# Patient Record
Sex: Female | Born: 1990 | Race: White | Hispanic: No | Marital: Married | Smoking: Current every day smoker
Health system: Southern US, Community
[De-identification: ages and names within clinical notes are randomized; demographics above are authoritative.]

## PROBLEM LIST (undated history)

## (undated) DIAGNOSIS — Z87410 Personal history of cervical dysplasia: Secondary | ICD-10-CM

## (undated) DIAGNOSIS — F329 Major depressive disorder, single episode, unspecified: Secondary | ICD-10-CM

## (undated) DIAGNOSIS — Q760 Spina bifida occulta: Secondary | ICD-10-CM

## (undated) DIAGNOSIS — R202 Paresthesia of skin: Secondary | ICD-10-CM

## (undated) DIAGNOSIS — M797 Fibromyalgia: Secondary | ICD-10-CM

## (undated) DIAGNOSIS — N200 Calculus of kidney: Secondary | ICD-10-CM

## (undated) DIAGNOSIS — M419 Scoliosis, unspecified: Secondary | ICD-10-CM

## (undated) DIAGNOSIS — F411 Generalized anxiety disorder: Secondary | ICD-10-CM

## (undated) DIAGNOSIS — Z8742 Personal history of other diseases of the female genital tract: Secondary | ICD-10-CM

## (undated) DIAGNOSIS — IMO0002 Reserved for concepts with insufficient information to code with codable children: Secondary | ICD-10-CM

## (undated) DIAGNOSIS — K449 Diaphragmatic hernia without obstruction or gangrene: Secondary | ICD-10-CM

## (undated) DIAGNOSIS — G894 Chronic pain syndrome: Secondary | ICD-10-CM

## (undated) DIAGNOSIS — T4145XA Adverse effect of unspecified anesthetic, initial encounter: Secondary | ICD-10-CM

## (undated) DIAGNOSIS — R200 Anesthesia of skin: Secondary | ICD-10-CM

## (undated) DIAGNOSIS — Z87442 Personal history of urinary calculi: Secondary | ICD-10-CM

## (undated) DIAGNOSIS — G950 Syringomyelia and syringobulbia: Secondary | ICD-10-CM

## (undated) DIAGNOSIS — Z8659 Personal history of other mental and behavioral disorders: Secondary | ICD-10-CM

## (undated) DIAGNOSIS — N201 Calculus of ureter: Secondary | ICD-10-CM

## (undated) DIAGNOSIS — F32A Depression, unspecified: Secondary | ICD-10-CM

## (undated) DIAGNOSIS — J4520 Mild intermittent asthma, uncomplicated: Secondary | ICD-10-CM

## (undated) DIAGNOSIS — K219 Gastro-esophageal reflux disease without esophagitis: Secondary | ICD-10-CM

## (undated) DIAGNOSIS — T8859XA Other complications of anesthesia, initial encounter: Secondary | ICD-10-CM

## (undated) DIAGNOSIS — R29898 Other symptoms and signs involving the musculoskeletal system: Secondary | ICD-10-CM

## (undated) DIAGNOSIS — R112 Nausea with vomiting, unspecified: Secondary | ICD-10-CM

## (undated) DIAGNOSIS — Z9889 Other specified postprocedural states: Secondary | ICD-10-CM

## (undated) HISTORY — PX: CYSTOSCOPY/RETROGRADE/URETEROSCOPY: SHX5316

## (undated) HISTORY — DX: Reserved for concepts with insufficient information to code with codable children: IMO0002

## (undated) HISTORY — DX: Fibromyalgia: M79.7

## (undated) HISTORY — DX: Gastro-esophageal reflux disease without esophagitis: K21.9

---
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
## 2008-03-17 ENCOUNTER — Ambulatory Visit: Payer: Self-pay | Admitting: Orthopedic Surgery

## 2008-03-17 DIAGNOSIS — G950 Syringomyelia and syringobulbia: Secondary | ICD-10-CM

## 2008-03-17 DIAGNOSIS — M549 Dorsalgia, unspecified: Secondary | ICD-10-CM

## 2008-03-17 DIAGNOSIS — G8929 Other chronic pain: Secondary | ICD-10-CM

## 2008-03-21 ENCOUNTER — Ambulatory Visit (HOSPITAL_COMMUNITY): Admission: RE | Admit: 2008-03-21 | Discharge: 2008-03-21 | Payer: Self-pay | Admitting: Orthopedic Surgery

## 2008-03-28 ENCOUNTER — Ambulatory Visit: Payer: Self-pay | Admitting: Orthopedic Surgery

## 2008-04-01 ENCOUNTER — Encounter (INDEPENDENT_AMBULATORY_CARE_PROVIDER_SITE_OTHER): Payer: Self-pay | Admitting: *Deleted

## 2008-04-08 ENCOUNTER — Telehealth: Payer: Self-pay | Admitting: Orthopedic Surgery

## 2008-05-13 ENCOUNTER — Encounter: Payer: Self-pay | Admitting: Orthopedic Surgery

## 2008-05-19 ENCOUNTER — Ambulatory Visit (HOSPITAL_COMMUNITY): Admission: RE | Admit: 2008-05-19 | Discharge: 2008-05-19 | Payer: Self-pay | Admitting: Neurosurgery

## 2008-09-21 ENCOUNTER — Encounter: Payer: Self-pay | Admitting: Orthopedic Surgery

## 2009-05-01 ENCOUNTER — Ambulatory Visit (HOSPITAL_COMMUNITY): Admission: RE | Admit: 2009-05-01 | Discharge: 2009-05-01 | Payer: Self-pay | Admitting: Neurosurgery

## 2010-01-07 ENCOUNTER — Emergency Department (HOSPITAL_COMMUNITY)
Admission: EM | Admit: 2010-01-07 | Discharge: 2010-01-07 | Payer: Self-pay | Source: Home / Self Care | Admitting: Emergency Medicine

## 2010-01-08 ENCOUNTER — Ambulatory Visit (HOSPITAL_COMMUNITY)
Admission: RE | Admit: 2010-01-08 | Discharge: 2010-01-08 | Payer: Self-pay | Source: Home / Self Care | Attending: Emergency Medicine | Admitting: Emergency Medicine

## 2010-01-10 ENCOUNTER — Encounter (HOSPITAL_COMMUNITY)
Admission: RE | Admit: 2010-01-10 | Discharge: 2010-02-09 | Payer: Self-pay | Source: Home / Self Care | Attending: Family Medicine | Admitting: Family Medicine

## 2010-01-13 ENCOUNTER — Emergency Department (HOSPITAL_COMMUNITY)
Admission: EM | Admit: 2010-01-13 | Discharge: 2010-01-13 | Payer: Self-pay | Source: Home / Self Care | Admitting: Emergency Medicine

## 2010-01-23 ENCOUNTER — Ambulatory Visit
Admission: RE | Admit: 2010-01-23 | Discharge: 2010-01-23 | Payer: Self-pay | Source: Home / Self Care | Attending: Urgent Care | Admitting: Urgent Care

## 2010-01-23 ENCOUNTER — Telehealth (INDEPENDENT_AMBULATORY_CARE_PROVIDER_SITE_OTHER): Payer: Self-pay

## 2010-01-23 DIAGNOSIS — R112 Nausea with vomiting, unspecified: Secondary | ICD-10-CM | POA: Insufficient documentation

## 2010-01-23 DIAGNOSIS — R1011 Right upper quadrant pain: Secondary | ICD-10-CM | POA: Insufficient documentation

## 2010-01-23 DIAGNOSIS — R197 Diarrhea, unspecified: Secondary | ICD-10-CM | POA: Insufficient documentation

## 2010-01-24 ENCOUNTER — Encounter: Payer: Self-pay | Admitting: Urgent Care

## 2010-01-25 ENCOUNTER — Ambulatory Visit: Admit: 2010-01-25 | Payer: Self-pay | Admitting: Gastroenterology

## 2010-01-25 LAB — CONVERTED CEMR LAB: Beta hcg, urine, semiquantitative: NEGATIVE

## 2010-01-26 ENCOUNTER — Encounter: Payer: Self-pay | Admitting: Internal Medicine

## 2010-01-30 ENCOUNTER — Telehealth (INDEPENDENT_AMBULATORY_CARE_PROVIDER_SITE_OTHER): Payer: Self-pay

## 2010-01-30 ENCOUNTER — Emergency Department (HOSPITAL_COMMUNITY)
Admission: EM | Admit: 2010-01-30 | Discharge: 2010-01-30 | Payer: Self-pay | Source: Home / Self Care | Admitting: Emergency Medicine

## 2010-01-30 ENCOUNTER — Encounter: Payer: Self-pay | Admitting: Internal Medicine

## 2010-01-31 LAB — URINALYSIS, ROUTINE W REFLEX MICROSCOPIC
Bilirubin Urine: NEGATIVE
Ketones, ur: NEGATIVE mg/dL
Leukocytes, UA: NEGATIVE
Nitrite: NEGATIVE
Protein, ur: NEGATIVE mg/dL
Specific Gravity, Urine: >1.03 — ABNORMAL HIGH (ref 1.005–1.030)
Urine Glucose, Fasting: NEGATIVE mg/dL
Urobilinogen, UA: 0.2 mg/dL (ref 0.0–1.0)
pH: 5.5 (ref 5.0–8.0)

## 2010-01-31 LAB — URINE MICROSCOPIC-ADD ON

## 2010-01-31 LAB — PREGNANCY, URINE: Preg Test, Ur: NEGATIVE

## 2010-02-04 ENCOUNTER — Encounter: Payer: Self-pay | Admitting: Family Medicine

## 2010-02-08 ENCOUNTER — Ambulatory Visit (HOSPITAL_COMMUNITY)
Admission: RE | Admit: 2010-02-08 | Discharge: 2010-02-08 | Payer: Self-pay | Source: Home / Self Care | Attending: Internal Medicine | Admitting: Internal Medicine

## 2010-02-10 ENCOUNTER — Encounter: Payer: Self-pay | Admitting: Internal Medicine

## 2010-02-13 ENCOUNTER — Encounter: Payer: Self-pay | Admitting: Urgent Care

## 2010-02-13 NOTE — Op Note (Signed)
[name] [name], [name] [name]                  ACCOUNT NO.:  [id number]  MEDICAL RECORD NO.:  [id number]          PATIENT TYPE:  AMB  LOCATION:  DAY                           FACILITY:  [facility]  PHYSICIAN:  R. Roetta Sessions, M.D. DATE OF BIRTH:  1990-07-19  DATE OF PROCEDURE:  02/08/2010 DATE OF DISCHARGE:                              OPERATIVE REPORT   PROCEDURE:  EGD with biopsy followed by ileal colonoscopy with biopsy.  INDICATIONS FOR PROCEDURE:  Obese 20 year old lady with intermittent nausea, vomiting, and diarrhea, 3 weeks duration.  Stool studies were all negative.  Gallbladder workup including ultrasound and HIDA also negative.  CT demonstrate nonobstructing right ureteral calculus. Pregnancy test came back negative.  She started taking sublingual Levsin prescribed through our office, which just helped to some degree.  She takes omeprazole 20 mg orally daily for reflux.  EGD and colonoscopy is now being done.  Risks, benefits, limitations, alternatives, and imponderables have been discussed, questions answered.  Please see the documentation in the medical record.  PROCEDURE NOTE:  O2 saturation, blood pressure, pulse, and respirations were monitored throughout the entirety of both procedures.  CONSCIOUS SEDATION:  Versed 12 mg IV, Demerol 200 mg IV in divided doses, Phenergan 25 mg diluted slow IV push to augment conscious sedation.  Cetacaine spray for topical pharyngeal anesthesia.  FINDINGS:  Examination of the tubular esophagus revealed circumferential distal esophageal erosions superimposed on noncritical-appearing Schatzki's ring.  There was no Barrett's esophagus.  EG junction easily traversed.  Stomach:  Gastric cavity was emptied and insufflated well with air.  Thorough examination of the gastric mucosa including retroflexed view of the proximal stomach and esophagogastric junction demonstrate some excoriations and some adherent clot on the gastric body.  There is a small  hiatal hernia.  Pylorus is patent, easily traversed.  Examination of the bulb and second portion revealed no abnormalities.  Look back into the stomach confirmed the above findings. There was no ulcer-infiltrating process.  The abnormal-appearing gastric mucosa was biopsied for histologic study.  The patient tolerated the procedure well, was prepared for colonoscopy.  Digital rectal exam revealed no abnormalities.  Endoscopic findings:  The prep was adequate.  Colon:  The colonic mucosa was surveyed from the rectosigmoid junction through the left transverse, right colon to the appendiceal orifice, ileocecal valve/cecum.  These structures were well seen, photographed for the record.  The terminal ileum was intubated 10 cm from this level, scope was slowly withdrawn. All previously mentioned mucosal surfaces were again seen.  Terminal ileal mucosa appeared normal.  The patient had a couple of areas of submucosal petechiae, some minimally fibrotic-appearing mucosa in the ascending segment in a patchy distribution in the descending segment.Biopsies of the ascending segment and descending segment were taken for histologic study.  Remainder of the colonic mucosa appeared unremarkable.  Scope was pulled down to the rectum.  Rectal vault is small, I attempted to retroflex but was unable to do so.  However for the same reason, I was able to see the rectal mucosa very well, en face it appeared normal.  The patient tolerated the procedure well.  Cecal withdrawal time 9 minutes.  IMPRESSION: 1. Esophagogastroduodenoscopy, circumferential distal esophageal     erosions consistent with erosive reflux esophagitis, noncritical     Schatzki's ring not manipulated. 2. Hiatal hernia. 3. Excoriations and eroded mucosa involving the body appeared to be     most likely related to trauma of heaving, status post biopsy     otherwise unremarkable stomach, patent pylorus, normal D1 and D2. 4. Colonoscopy  findings, normal rectum, scattered petechiae and     fibrotic-appearing mucosa of uncertain significance (query     resolving infectious colitis) status post biopsy normal terminal     ileum.  RECOMMENDATIONS: 1. Continue Levsin sublingually a.c. and bedtime p.r.n. abdominal     cramps and diarrhea. 2. Stop omeprazole, begin Dexalone 60 mg orally daily for reflux,     literature on reflux provided to Ms. [name]. 3. Add a probiotic in way of Align 1 capsule daily to regimen. 4. Followup on path. 5. Further recommendations to follow.     Jonathon Bellows, M.D.     RMR/MEDQ  D:  02/08/2010  T:  02/09/2010  Job:  [id number]  cc:   Lorin Picket A. Gerda Diss, MD Fax: [phone]  Electronically Signed by Lorrin Goodell M.D. on 02/13/2010 01:41:14 PM

## 2010-02-15 NOTE — Progress Notes (Signed)
Summary: pt going to ER  Phone Note Call from Patient Call back at Home Phone [phone]   Caller: Patient Summary of Call: FYI-pt called-  she stated she was in excruciating  pain and she was going to the ER.  Initial call taken by: Hendricks Limes LPN,  January 30, 2010 10:38 AM     Appended Document: pt going to ER Agree w/ plan

## 2010-02-15 NOTE — Assessment & Plan Note (Signed)
Summary: ABD PAIN,VOMITING,RECTAL BLEED/SS   Visit Type:  Initial Consult Primary Care Provider:  Sherie Don, NP (Dr Lilyan Punt)  Chief Complaint:  abd pain, vomiting, and and diarrhea.  History of Present Illness: 20 y/o caucasian female here for further evaluation N/V/D and abdominal pain x 3 weeks.  c/o intermittant RUQ pain "stabbing & throbbing" lasts to 1 hr.  Does not vomit every day, but may vomit up to 5-6 times.  Daily diarrhea 3-4 times per day, loose & watery.   Denies mucus or blood.  Weight stable.  Appetite decreased.  c/o early satiety, denies heartburn or indigestion.  Takes percocet 5/325 2 q 6 hrs for pain in side x 2 weeks.  Omperazole 20mg  daily without help.  Taking carafate.   LMP: 3 weeks ago, irregular cycles, believes she had negative upreg at Dr Cathlyn Parsons, uses Nuva Ring HIDA normal 01/10/10 01/04/10->CBC,Met7, LFT normal, lipase normal [place] 12/26->fatty liver    Current Problems (verified): 1)  Ruq Pain  (ICD-789.01) 2)  Diarrhea  (ICD-787.91) 3)  Nausea and Vomiting  (ICD-787.01) 4)  Syringomyelia and Syringobulbia  (ICD-336.0) 5)  Back Pain  (ICD-724.5)  Current Medications (verified): 1)  Advair Diskus 100-50 Mcg/dose Misc (Fluticasone-Salmeterol) 2)  Albuterol Sulfate (2.5 Mg/34ml) 0.083% Nebu (Albuterol Sulfate) 3)  Vicodin 5-500 Mg Tabs (Hydrocodone-Acetaminophen) .Marland Kitchen.. 1 Q 4 As Needed Pain 4)  Nuvoring 5)  Omeprazole 20 Mg Cpdr (Omeprazole) .Marland Kitchen.. 1 By Mouth Daily 6)  Levsin/sl 0.125 Mg Subl (Hyoscyamine Sulfate) .Marland Kitchen.. 1 By Mouth Ac/hs (Up To Qid) As Needed Diarrhea  Allergies (verified): 1)  ! Sulfa  Past History:  Past Medical History: fibromyalgia, previously Dr Murray Hodgkins in [place] (pain clinic) asthma syringomyelia DDD  Past Surgical History: renal lithiasis removed  Family History: No known family history of colorectal carcinoma, IBD, liver or chronic GI problems. Father: (24) MS Mother: (42) cervical CA Siblings: 1 brother  -healthy  Social History: single, lives w/ mother & 1 son, & stepdad & step brother Patient currently smokes.  1/2ppd x 10 yrs Alcohol Use - yes, 1-2 drinks/year Illicit Drug Use - no Patient does not get regular exercise.  full time student [employer]  Smoking Status:  current Drug Use:  no Does Patient Exercise:  no  Review of Systems General:  Complains of sleep disorder; denies fever, chills, sweats, anorexia, fatigue, weakness, malaise, and weight loss. CV:  Denies chest pains, angina, palpitations, syncope, dyspnea on exertion, orthopnea, PND, peripheral edema, and claudication. Resp:  Denies dyspnea at rest, dyspnea with exercise, cough, sputum, wheezing, coughing up blood, and pleurisy. GI:  Denies difficulty swallowing, pain on swallowing, jaundice, and fecal incontinence. GU:  Complains of abnormal vaginal bleeding; denies urinary burning, blood in urine, nocturnal urination, urinary frequency, urinary incontinence, and vaginal discharge; see HPI. MS:  Denies joint pain / LOM, joint swelling, joint stiffness, joint deformity, low back pain, muscle weakness, muscle cramps, muscle atrophy, leg pain at night, leg pain with exertion, and shoulder pain / LOM hand / wrist pain (CTS). Derm:  Denies rash, itching, dry skin, hives, moles, warts, and unhealing ulcers; hands "turning bluish". Neuro:  Denies weakness, paralysis, abnormal sensation, seizures, syncope, tremors, vertigo, transient blindness, frequent falls, frequent headaches, difficulty walking, headache, sciatica, radiculopathy other:, restless legs, memory loss, and confusion. Psych:  Complains of depression; denies anxiety, memory loss, suicidal ideation, hallucinations, paranoia, phobia, and confusion. Heme:  Denies bruising, bleeding, and enlarged lymph nodes.  Vital Signs:  Patient profile:   19 year  old female Height:      60 inches Weight:      249 pounds BMI:     48.81 Temp:     97.7 degrees F oral Pulse  rate:   60 / minute BP sitting:   110 / 72  (left arm) Cuff size:   large  Vitals Entered By: Hendricks Limes LPN (January 23, 2010 11:15 AM)  Physical Exam  General:  Well developed, well nourished, no acute distress. Head:  Normocephalic and atraumatic. Eyes:  sclera clear, no icterus. Ears:  Normal auditory acuity. Nose:  No deformity, discharge,  or lesions. Mouth:  No deformity or lesions, dentition normal. Neck:  Supple; no masses or thyromegaly. Lungs:  Clear throughout to auscultation. Heart:  Regular rate and rhythm; no murmurs, rubs,  or bruits. Abdomen:  Soft, nontender and nondistended. No masses, hepatosplenomegaly or hernias noted. Normal bowel sounds.without guarding and without rebound.   Msk:  Symmetrical with no gross deformities. Normal posture. Pulses:  Normal pulses noted. Extremities:  No clubbing, cyanosis, edema or deformities noted. Neurologic:  Alert and  oriented x4;  grossly normal neurologically. Skin:  Intact without significant lesions or rashes. multiple tattoos Cervical Nodes:  No significant cervical adenopathy. Psych:  Alert and cooperative. Normal mood and affect.  Impression & Recommendations:  Problem # 1:  RUQ PAIN (ICD-789.01) 20 y/o caucasian female w/ nausea, vomiting, & diarrhea x3 wks.  Gallbladder work-up negative.  Differentials include PUD, refractory GERD,  IBS, c diff or infectious colitis, less likely.  She will need further evaluation with EGD and possibly colonoscopy if stool studies are benign. Orders: T-Pregnancy Test, Urine, Qual (16109) Consultation Level IV (60454)  Problem # 2:  DIARRHEA (ICD-787.91) See #1  Problem # 3:  NAUSEA AND VOMITING (ICD-787.01) See #1  Other Orders: T-Culture, Stool ([id number]) T-Fecal WBC (83630-70400) T-C diff by PCR (09811) T-Stool Giardia / Crypto- EIA (91478)  Patient Instructions: 1)  Cont daily omeprazole 2)  trial levsin once u preg back & stools submitted 3)  will  schedule EGD and poss colonoscopy once stools returned if diarrhea persists Prescriptions: LEVSIN/SL 0.125 MG SUBL (HYOSCYAMINE SULFATE) 1 by mouth ac/hs (up to QID) as needed diarrhea  #90 x 1   Entered and Authorized by:   Joselyn Arrow FNP-BC   Signed by:   Joselyn Arrow FNP-BC on 01/23/2010   Method used:   Electronically to        [facility]* (retail)       [address]       [place], [place]  [address]       Ph: [phone]       Fax: [phone]   RxID:   [id number]   Appended Document: ABD PAIN,VOMITING,RECTAL BLEED/SS CT ABD/pelvis w/ IV contrast 01/13/10-> Right non-obstructing nephrolithiasis, Diverticulosis without evidence of acute diverticulitis

## 2010-02-15 NOTE — Letter (Addendum)
Summary: Patient Notice, Endo Biopsy Results  [facility]  [address]   [place], [place] [address]   Phone: [phone]  Fax: [phone]       February 10, 2010   [facility] [address] [place], [place]  [address] 11-16-1990    Dear Ms. [name] [name],  I am pleased to inform you that the biopsies taken during your recent endoscopic examination did not show any evidence of cancer or infection upon pathologic examination. There was mild inflammation.  Additional information/recommendations  Please call [phone] to schedule a return visit to review your condition.  Continue with the treatment plan as outlined on the day of your exam.  Please call us if you are having persistent problems or have questions about your condition that have not been fully answered at this time.  Sincerely,    R. Roetta Sessions MD, FACP [facility] Ph: [phone]   Fax: [phone]   Appended Document: Patient Notice, Endo Biopsy Results letter mailed to pt  Appended Document: Patient Notice, Endo Biopsy Results pt aware of appt for 2/20 @ 230pm w/KJ

## 2010-02-15 NOTE — Letter (Signed)
Summary: [facility]/EGD ORDER  [facility]/EGD ORDER   Imported By: Ave Filter 01/30/2010 09:23:06  _____________________________________________________________________  External Attachment:    Type:   Image     Comment:   External Document

## 2010-02-15 NOTE — Letter (Signed)
Summary: REFERRAL FROM [facility]  REFERRAL FROM [facility]   Imported By: Rexene Alberts 01/26/2010 11:37:11  _____________________________________________________________________  External Attachment:    Type:   Image     Comment:   External Document

## 2010-02-15 NOTE — Progress Notes (Signed)
Summary: phone note/ NOW CONSTIPATED  Phone Note Call from Patient   Caller: Patient Summary of Call: Pt called and said she was given bottles to do stool samples for diarrhea this AM. When she got home she was constipated and wants to know what she should do now. She also said that her stomach was cramping and hurting in the bottom. I informed her per Lorenza Burton, NP that she is to also do a urine pregnancy, since there was no recent one on file. That order was faxed to [facility] and i told her i will call her back when [facility] answers about the constipation.  Initial call taken by: Cloria Spring LPN,  January 23, 2010 2:40 PM     Appended Document: phone note/ NOW CONSTIPATED CALL BACK NUMBER.Marland KitchenMarland KitchenMarland [phone].  Appended Document: phone note/ NOW CONSTIPATED Turn in stools in diarrhea returns U preg prior to taking ANY MEDS  Appended Document: phone note/ NOW CONSTIPATED Informed pt. She turned in stools this AM and did U-preg.

## 2010-02-21 ENCOUNTER — Other Ambulatory Visit (HOSPITAL_COMMUNITY): Payer: Self-pay | Admitting: Neurosurgery

## 2010-02-21 DIAGNOSIS — M546 Pain in thoracic spine: Secondary | ICD-10-CM

## 2010-02-21 NOTE — Medication Information (Signed)
Summary: DEXILANT 60MG   DEXILANT 60MG    Imported By: Rexene Alberts 02/13/2010 15:21:59  _____________________________________________________________________  External Attachment:    Type:   Image     Comment:   External Document  Appended Document: DEXILANT 60MG     Prescriptions: DEXILANT 60 MG CPDR (DEXLANSOPRAZOLE) 1 by mouth daily for acid reflux  #31 x 5   Entered and Authorized by:   Joselyn Arrow FNP-BC   Signed by:   Joselyn Arrow FNP-BC on 02/13/2010   Method used:   Electronically to        [facility]* (retail)       [address]       [place], [place]  [address]       Ph: [phone]       Fax: [phone]   RxID:   [id number]

## 2010-02-26 ENCOUNTER — Ambulatory Visit (HOSPITAL_COMMUNITY)
Admission: RE | Admit: 2010-02-26 | Discharge: 2010-02-26 | Disposition: A | Discharge status: Home / Self Care | Payer: BC Managed Care – PPO | Source: Ambulatory Visit | Attending: Neurosurgery | Admitting: Neurosurgery

## 2010-02-26 DIAGNOSIS — M546 Pain in thoracic spine: Secondary | ICD-10-CM

## 2010-02-26 MED ORDER — GADOBENATE DIMEGLUMINE 529 MG/ML IV SOLN
20.0000 mL | Freq: Once | INTRAVENOUS | Status: AC | PRN
  Administered 2010-02-26: 20 mL via INTRAVENOUS

## 2010-03-05 ENCOUNTER — Ambulatory Visit: Payer: Self-pay | Admitting: Urgent Care

## 2010-03-19 ENCOUNTER — Encounter: Payer: Self-pay | Admitting: Urgent Care

## 2010-03-22 ENCOUNTER — Encounter: Payer: Self-pay | Admitting: Urgent Care

## 2010-03-26 LAB — POCT I-STAT, CHEM 8
Calcium, Ion: 1.11 mmol/L — ABNORMAL LOW (ref 1.12–1.32)
HCT: 45 % (ref 36.0–46.0)
TCO2: 26 mmol/L (ref 0–100)

## 2010-03-26 LAB — HEPATIC FUNCTION PANEL
ALT: 17 U/L (ref 0–35)
Alkaline Phosphatase: 75 U/L (ref 39–117)
Bilirubin, Direct: 0.1 mg/dL (ref 0.0–0.3)
Total Bilirubin: <0.1 mg/dL — ABNORMAL LOW (ref 0.3–1.2)

## 2010-03-26 LAB — URINALYSIS, ROUTINE W REFLEX MICROSCOPIC
Bilirubin Urine: NEGATIVE
Bilirubin Urine: NEGATIVE
Ketones, ur: NEGATIVE mg/dL
Leukocytes, UA: NEGATIVE
Nitrite: NEGATIVE
Nitrite: NEGATIVE
Specific Gravity, Urine: 1.025 (ref 1.005–1.030)
Urobilinogen, UA: 0.2 mg/dL (ref 0.0–1.0)
Urobilinogen, UA: 1 mg/dL (ref 0.0–1.0)

## 2010-03-26 LAB — COMPREHENSIVE METABOLIC PANEL
ALT: 21 U/L (ref 0–35)
BUN: 14 mg/dL (ref 6–23)
CO2: 25 mEq/L (ref 19–32)
Calcium: 9.5 mg/dL (ref 8.4–10.5)
Creatinine, Ser: 0.94 mg/dL (ref 0.4–1.2)
GFR calc non Af Amer: >60 mL/min (ref >60)
Glucose, Bld: 95 mg/dL (ref 70–99)

## 2010-03-26 LAB — DIFFERENTIAL
Basophils Absolute: 0.1 10*3/uL (ref 0.0–0.1)
Eosinophils Relative: 6 % — ABNORMAL HIGH (ref 0–5)
Eosinophils Relative: 5 % (ref 0–5)
Lymphocytes Relative: 25 % (ref 12–46)
Lymphocytes Relative: 25 % (ref 12–46)
Lymphs Abs: 2 10*3/uL (ref 0.7–4.0)
Monocytes Absolute: 0.5 10*3/uL (ref 0.1–1.0)
Neutro Abs: 5.1 10*3/uL (ref 1.7–7.7)
Neutrophils Relative %: 62 % (ref 43–77)

## 2010-03-26 LAB — LIPASE, BLOOD
Lipase: 19 U/L (ref 11–59)
Lipase: 16 U/L (ref 11–59)

## 2010-03-26 LAB — CBC
HCT: 39.6 % (ref 36.0–46.0)
HCT: 42.4 % (ref 36.0–46.0)
Hemoglobin: 14.2 g/dL (ref 12.0–15.0)
MCH: 29.8 pg (ref 26.0–34.0)
MCHC: 35.9 g/dL (ref 30.0–36.0)
MCV: 83.2 fL (ref 78.0–100.0)
MCV: 85.5 fL (ref 78.0–100.0)
RBC: 4.76 MIL/uL (ref 3.87–5.11)
RBC: 4.96 MIL/uL (ref 3.87–5.11)
WBC: 7.9 10*3/uL (ref 4.0–10.5)

## 2010-03-26 LAB — URINE MICROSCOPIC-ADD ON

## 2010-03-26 LAB — POCT PREGNANCY, URINE: Preg Test, Ur: NEGATIVE

## 2010-03-27 NOTE — Medication Information (Signed)
Summary: PA forms for Dexilant  PA forms for Dexilant   Imported By: Hendricks Limes LPN 98/11/9145 82:95:62  _____________________________________________________________________  External Attachment:    Type:   Image     Comment:   External Document

## 2010-04-02 ENCOUNTER — Ambulatory Visit: Payer: Self-pay | Admitting: Urgent Care

## 2010-05-14 ENCOUNTER — Ambulatory Visit: Payer: BC Managed Care – PPO | Admitting: Gastroenterology

## 2010-05-29 ENCOUNTER — Encounter: Payer: Self-pay | Admitting: Gastroenterology

## 2010-05-29 ENCOUNTER — Other Ambulatory Visit: Payer: Self-pay | Admitting: Gastroenterology

## 2010-05-29 ENCOUNTER — Ambulatory Visit (INDEPENDENT_AMBULATORY_CARE_PROVIDER_SITE_OTHER): Payer: BC Managed Care – PPO | Admitting: Gastroenterology

## 2010-05-29 VITALS — BP 124/80 | HR 98 | Temp 99.0°F | Ht 60.0 in | Wt 236.6 lb

## 2010-05-29 DIAGNOSIS — R197 Diarrhea, unspecified: Secondary | ICD-10-CM

## 2010-05-29 DIAGNOSIS — R1033 Periumbilical pain: Secondary | ICD-10-CM

## 2010-05-29 DIAGNOSIS — R1011 Right upper quadrant pain: Secondary | ICD-10-CM

## 2010-05-29 DIAGNOSIS — R112 Nausea with vomiting, unspecified: Secondary | ICD-10-CM

## 2010-05-29 LAB — CBC WITH DIFFERENTIAL/PLATELET
Basophils Absolute: 0 10*3/uL (ref 0.0–0.1)
Eosinophils Relative: 5 % (ref 0–5)
Lymphocytes Relative: 23 % (ref 12–46)
Neutro Abs: 5.5 10*3/uL (ref 1.7–7.7)
Platelets: 284 10*3/uL (ref 150–400)
RDW: 12.6 % (ref 11.5–15.5)
WBC: 8.3 10*3/uL (ref 4.0–10.5)

## 2010-05-29 LAB — HEPATIC FUNCTION PANEL
Bilirubin, Direct: <0.1 mg/dL (ref 0.0–0.3)
Total Protein: 6.5 g/dL (ref 6.0–8.3)

## 2010-05-29 MED ORDER — DEXLANSOPRAZOLE 60 MG PO CPDR
60.0000 mg | DELAYED_RELEASE_CAPSULE | Freq: Every day | ORAL | Status: DC
Start: 2010-05-29 — End: 2010-07-16

## 2010-05-29 NOTE — H&P (Signed)
[name] [name], [name]                  ACCOUNT NO.:  [id number]   MEDICAL RECORD NO.:  [id number]          PATIENT TYPE:  AMB   LOCATION:  DAY                           FACILITY:  [facility]   PHYSICIAN:  Ky Barban, M.D.DATE OF BIRTH:  October 29, 1990   DATE OF ADMISSION:  12/08/2007  DATE OF DISCHARGE:  LH                              HISTORY & PHYSICAL   CHIEF COMPLAINT:  Recurrent right renal colic.   HISTORY:  A 20 year old female, went to the emergency room last  Wednesday with right renal colic, and CT scan shows there is a 4.5 mm  stone in the right ureterovesical junction causing hydroureter.  She was  advised to wait and see if she can pass the stone.  She comes back here  for followup in the office, and she is still having pain, has not passed  the stone.  No fever, chills, or voiding difficulties.  She has  requested to get the stone out, I told her that she can still wait, but  she wants to have the stone out, so I told her that we can go ahead and  do a stone basket procedure.  Limitations and complications discussed.  She understands and wants me to go ahead and proceed.  She is coming as  outpatient tomorrow.  We will do this procedure.  There is a possibility  I leave a double-J stent.   PAST MEDICAL HISTORY:  She said a couple of years ago, she had renal  colic on that side, but she has not passed the stone.  Past history is  otherwise unremarkable.   PERSONAL HISTORY:  Smokes a pack a day, does not drink.   ALLERGIES:  SULFA DRUGS.   PHYSICAL EXAMINATION:  VITAL SIGNS:  Blood pressure 120/77, temperature  98.6.  CENTRAL NERVOUS SYSTEM:  Negative.  HEAD, NECK, EYE, AND ENT:  Negative.  CHEST:  Symmetrical.  HEART:  Regular sinus rhythm, no murmur.  ABDOMEN:  Soft, flat.  Liver, spleen, and kidneys not palpable.  No CVA  tenderness.  PELVIC:  Deferred.  EXTREMITIES:  Normal.   IMPRESSION:  Right ureteral calculus.   PLAN:  Cystoscopy, right retrograde  pyelogram, ureteroscopic stone  basket extraction, holmium laser lithotripsy, __________, insertion of  double-J stent under anesthesia as outpatient.      Ky Barban, M.D.  Electronically Signed     MIJ/MEDQ  D:  12/07/2007  T:  12/08/2007  Job:  [id number]

## 2010-05-29 NOTE — Progress Notes (Signed)
Cc to PCP 

## 2010-05-29 NOTE — Assessment & Plan Note (Signed)
Intermittent, ?IBS. No significant problems at this time.

## 2010-05-29 NOTE — Assessment & Plan Note (Addendum)
Complains more of periumbilical/lower abdominal pain to me. She states her right upper quadrant pain is not as bad. She denies any GYN or GU symptoms. Denies being sexually active. Discussed extensive evaluation in the past. At this point will repeat some labs. We'll discuss further with Dr. Jena Gauss. Continue dicyclomine for now.

## 2010-05-29 NOTE — Op Note (Signed)
[name] [name], [name] [name]                  ACCOUNT NO.:  [id number]   MEDICAL RECORD NO.:  [id number]          PATIENT TYPE:  AMB   LOCATION:  DAY                           FACILITY:  [facility]   PHYSICIAN:  Ky Barban, M.D.DATE OF BIRTH:  Apr 14, 1990   DATE OF PROCEDURE:  DATE OF DISCHARGE:                               OPERATIVE REPORT   PREOPERATIVE DIAGNOSIS:  Right ureteral calculus.   POSTOPERATIVE DIAGNOSIS:  No stone, she probably passed the stone.   PROCEDURE:  Cystoscopy, right retrograde pyelogram, right ureteroscopy.   ANESTHESIA:  General.   PROCEDURE:  The patient under general endotracheal anesthesia in  lithotomy position, usual prep and drape, #25 cystoscope introduced into  the bladder.  Both ureteral orifices looked normal.  Right ureteral  orifice catheterized with a wedge catheter.  Hypaque was injected under  fluoroscopic control.  Dye goes up into the ureter.  I do not see any  filling defect.  The ureterovesical junction was not well delineated, so  I decided to look into that area that is where the stone was based on CT  scan.  A guidewire was passed up into the renal pelvis over the  guidewire.  Balloon dilator was introduced.  The intramural ureter was  dilated.  Then, balloon was removed.  Using the short rigid ureteroscope  over the guidewire, I introduced the scope under direct vision.  The  intramural ureterovesical junction was inspected.  I do not see any  stone in that area.  I went up into the upper ureter.  There was no  stone.  All the instruments and guidewire was removed.  The bladder was  also inspected thoroughly and there is no stone and so she probably  passed it.  The instruments removed.  The patient left the operating  room in satisfactory condition.      Ky Barban, M.D.  Electronically Signed     MIJ/MEDQ  D:  12/08/2007  T:  12/08/2007  Job:  [id number]

## 2010-05-29 NOTE — Progress Notes (Signed)
Primary Care Physician:  Lilyan Punt, MD, MD  Primary Gastroenterologist:  Roetta Sessions, MD  Chief Complaint  Patient presents with  . EGD    ruq pain    HPI:  [name] [name] is a 20 y.o. female here at the request of Dr. Lilyan Punt for further evaluation of persistent right upper quadrant abdominal pain, vomiting, needs EGD. We saw this nice patient back in January of this year. She had an EGD and colonoscopy with ileoscopy in January 2012 4 several week history of vomiting, diarrhea. She had erosive reflux esophagitis, chronic gastritis but no H. pylori. Terminal ileum looked good, she had some nonspecific changes of the ascending colon felt to possibly be a resolving infection. Her biopsies were unremarkable. We switched her to Dexilant at that time and she tells me she was never able to get the medication filled by her pharmacy.   She was doing okay up until about one month ago. States she got a virus or infection. Went almost two weeks without being able to eat, secondary to N/V. Weight down from 249 (01/2010) to 236 lb. Took Zpak and Cipro due to fever (sinusitis).  Associated with abd pain. At this point, still with frequent nausea. Mid-abd pain, severe, sharp. Still some in Right sided pain but not as bad. Pain last for "a little bit" and then goes away and then comes back. Last time vomiting, one week ago. No heartburn. BM loose last couple of days. Intermittent constipation. Oxycodone six a day for fibromyalgia and chronic back pain.   HIDA normal 12/2009 Abd U/S 12/2009, fatty liver CT A/P with contrast, 12/2009, right nonobstructing 3mm calculi (2), diverticulosis  Current Outpatient Prescriptions  Medication Sig Dispense Refill  . albuterol (PROVENTIL) (2.5 MG/3ML) 0.083% nebulizer solution Take 2.5 mg by nebulization every 6 (six) hours as needed.        . cyclobenzaprine (FLEXERIL) 10 MG tablet Take 10 mg by mouth 3 (three) times daily as needed.        . dicyclomine (BENTYL) 10  MG capsule Take 10 mg by mouth 3 (three) times daily before meals.       . DULoxetine (CYMBALTA) 60 MG capsule Take 60 mg by mouth daily.        Marland Kitchen etonogestrel-ethinyl estradiol (NUVARING) 0.12-0.015 MG/24HR vaginal ring Place 1 each vaginally every 28 (twenty-eight) days. Insert vaginally and leave in place for 3 consecutive weeks, then remove for 1 week.       . Fluticasone-Salmeterol (ADVAIR DISKUS) 100-50 MCG/DOSE AEPB Inhale 1 puff into the lungs every 12 (twelve) hours.        Maximino Greenland IN Inhale into the lungs as needed.        Marland Kitchen oxyCODONE-acetaminophen (PERCOCET) 10-650 MG per tablet Take 1 tablet by mouth every 6 (six) hours as needed.        . pregabalin (LYRICA) 75 MG capsule Take 75 mg by mouth 2 (two) times daily.        . sucralfate (CARAFATE) 1 G tablet Take 1 g by mouth 4 (four) times daily as needed.       Marland Kitchen DISCONTD: omeprazole (PRILOSEC) 40 MG capsule Take 40 mg by mouth daily.        Marland Kitchen dexlansoprazole (DEXILANT) 60 MG capsule Take 1 capsule (60 mg total) by mouth daily.  30 capsule  5  . HYDROcodone-acetaminophen (VICODIN) 5-500 MG per tablet Take 1 tablet by mouth every 4 (four) hours as needed.        Marland Kitchen  hyoscyamine (LEVSIN SL) 0.125 MG SL tablet Place 0.125 mg under the tongue every 4 (four) hours as needed.        Marland Kitchen DISCONTD: dexlansoprazole (DEXILANT) 60 MG capsule Take 60 mg by mouth daily.          Allergies as of 05/29/2010 - Review Complete 05/29/2010  Allergen Reaction Noted  . Sulfonamide derivatives      Past Medical History  Diagnosis Date  . Fibromyalgia   . Asthma   . Syringomyelia     thoracic spine  . DDD (degenerative disc disease)   . GERD (gastroesophageal reflux disease)   . Chronic pain     Dr. Murray Hodgkins in [facility]    Past Surgical History  Procedure Date  . Renal lithiasis removed   . Esophagogastroduodenoscopy 01/2010    circumferential distal esophageal erosions, hiatal hernia, excoriations/erosion in gastric body ?trauma from  vomiting, bx negative and showed chronic gastritis  . Colonoscopy 01/2010    scattered petechiae and fibrotic-appearing mucosa of uncertain significance , ?resolving infection, bx benign    Family History  Problem Relation Age of Onset  . Multiple sclerosis Father   . Cervical cancer Mother     History   Social History  . Marital Status: Single    Spouse Name: N/A    Number of Children: N/A  . Years of Education: N/A   Occupational History  . [employer], clerical    Social History Main Topics  . Smoking status: Current Everyday Smoker -- 0.5 packs/day for 10 years    Types: Cigarettes  . Smokeless tobacco: Not on file  . Alcohol Use: 1.2 oz/week    2 Cans of beer per week  . Drug Use: No  . Sexually Active:    Other Topics Concern  . Not on file   Social History Narrative  . No narrative on file      ROS:  General: Negative for anorexia, fever, chills, fatigue, weakness. Eyes: Negative for vision changes.  ENT: Negative for hoarseness, difficulty swallowing , nasal congestion. CV: Negative for chest pain, angina, palpitations, dyspnea on exertion, peripheral edema.  Respiratory: Negative for dyspnea at rest, dyspnea on exertion, cough, sputum, wheezing.  GI: See history of present illness. GU:  Negative for dysuria, hematuria, urinary incontinence, urinary frequency, nocturnal urination.  MS: Chronic back pain.  Derm: Negative for rash or itching.  Neuro: Negative for weakness, abnormal sensation, seizure, frequent headaches, memory loss, confusion.  Psych: Negative for anxiety, depression, suicidal ideation, hallucinations.  Endo: Negative for unusual weight change.  Heme: Negative for bruising or bleeding. Allergy: Negative for rash or hives.    Physical Examination:  BP 124/80  Pulse 98  Temp(Src) 99 F (37.2 C) (Temporal)  Ht 5' (1.524 m)  Wt 236 lb 9.6 oz (107.321 kg)  BMI 46.21 kg/m2  LMP 04/30/2010   General: Well-nourished, well-developed in  no acute distress.  Head: Normocephalic, atraumatic.   Eyes: Conjunctiva pink, no icterus. Mouth: Oropharyngeal mucosa moist and pink , no lesions erythema or exudate. Neck: Supple without thyromegaly, masses, or lymphadenopathy.  Lungs: Clear to auscultation bilaterally.  Heart: Regular rate and rhythm, no murmurs rubs or gallops.  Abdomen: Bowel sounds are normal, mild periumbilical tenderness, nondistended, no hepatosplenomegaly or masses, no abdominal bruits or    hernia , no rebound or guarding.   Extremities: No lower extremity edema.  Neuro: Alert and oriented x 4 , grossly normal neurologically.  Skin: Warm and dry, no rash or jaundice.  Multiple tatoos. Psych: Alert and cooperative, normal mood and affect.

## 2010-05-29 NOTE — Assessment & Plan Note (Signed)
See periumbilical abd pain.

## 2010-05-29 NOTE — Assessment & Plan Note (Signed)
Intermittent nausea and vomiting going on now since last fall. EGD showed chronic gastritis and erosive reflux esophagitis. No evidence of H. Pylori. Basically has been on omeprazole off and on, really unclear how compliant she has been with the medication. Did okay for couple months the symptoms returned a few weeks ago when she got a "virus". She took 2 courses of antibiotics for sinusitis at that time. Developed nausea and vomiting and mid abdominal pain. She describes a midabdominal pain as being new. Not the same as her right upper quadrant pain. This could be refractory GERD and gastritis. Cannot rule out underlying gastroparesis related to narcotics. Given recent upper endoscopy, it is not clear that she needs another EGD at this time as requested by PCP. Will discuss further with Dr. Jena Gauss.  Stopped omeprazole. Begin Dexilant. #20 samples provided. RX and rebate card provided.

## 2010-06-01 NOTE — H&P (Signed)
NAMEKIRAH, STICE                  ACCOUNT NO.:  [id number]   MEDICAL RECORD NO.:  [id number]          PATIENT TYPE:  INP   LOCATION:  A413                          FACILITY:  [facility]   PHYSICIAN:  Lazaro Arms, M.D.   DATE OF BIRTH:  October 09, 1990   DATE OF ADMISSION:  04/04/2006  DATE OF DISCHARGE:  LH                              HISTORY & PHYSICAL   HISTORY:  [name] [name] is an 20 year old white female gravida 1, para 0  estimated date of delivery of April 06, 2006 who presented to labor and  delivery complaining of regular uterine contractions.  Her cervix was  about 3 cm at the time of original presentation however over the  ensuring hours she progressed from 3 to 4 cm, thinned out and as a  result is admitted for labor management.  The pregnancy has been  unremarkable except for some fluid in the fetal kidney, appears to be  benign cortical tissue, the fluid, the bladder all are completely  normal.  The pregnancy has otherwise been negative.  She does have a  history of kidney stones which have been quiet during the pregnancy and  asthma and she has a history of closed spina bifida.  Past surgical  history significant for a hand contusion in 2007.   ALLERGIES:  SULFA DRUGS.   MEDICATIONS:  Prenatal vitamins and inhaler p.r.n.   PRENATAL LABORATORIES:  Blood type is O positive, rubella is immune,  hepatitis B is negative, HIV is nonreactive, HSV-2 is negative,  serologies nonreactive x2, Pap smear was normal, GC and Chlamydia were  negative x2, AFP was normal, group B strep was negative, glucola was  normal.   REVIEW OF SYSTEMS:  Otherwise negative.   IMPRESSION:  1. Intrauterine pregnancy at 39-6/[redacted] weeks gestation.  2. Early phase labor.   PLAN:  Patient admitted for labor management with expectant NSVD.      Lazaro Arms, M.D.  Electronically Signed     LHE/MEDQ  D:  04/06/2006  T:  04/06/2006  Job:  [id number]

## 2010-06-01 NOTE — Procedures (Signed)
[facility]  Patient:    [name] [name], [name] [name] Visit Number: [id number] MRN: [id number]          Service Type: OUT Location: RAD Attending Physician:  Lilyan Punt Dictated by:   Kari Baars, M.D. Admit Date:  03/10/2001                      Pulmonary Function Test Inter.  IMPRESSION: 1. Spirometry is normal. Dictated by:   Kari Baars, M.D. Attending Physician:  Lilyan Punt DD:  03/10/01 TD:  03/10/01 Job: [id number]

## 2010-06-01 NOTE — Op Note (Signed)
NAMEHADLEE, [name]                  ACCOUNT NO.:  [id number]   MEDICAL RECORD NO.:  [id number]          PATIENT TYPE:  INP   LOCATION:  A413                          FACILITY:  [facility]   PHYSICIAN:  Lazaro Arms, M.D.   DATE OF BIRTH:  12-06-1990   DATE OF PROCEDURE:  04/05/2006  DATE OF DISCHARGE:                               OPERATIVE REPORT   [name] [name] is an 20 year old gravida 1 para 0, [redacted] weeks gestation.  Been  augmented.  Had an epidural placed.  She has progressed steadily through  the active phase of labor.  Pushed for just over an hour.  Over a  midline episiotomy, she has delivered a viable female infant, weighing 8  pounds 13 ounces.  Three-vessel cord.  Cord blood and cord gas were  sent.  The infant underwent routine neonatal resuscitation.  The midline  episiotomy extended into a fourth-degree.  She had a very short  perineum.  She had a through-and-through laceration of her rectum and  her sphincter.  Placenta was delivered spontaneously.  The uterus was  firm below the umbilicus.  The blood loss for delivery was about 300 cc.  The fourth-degree episiotomy was repaired without difficulty.  The  rectum was reapproximated with interrupted sutures with 3-0 Monocryl on  an SH needle.  The rectal sphincter was then reapproximated at 9  o'clock, 12 o'clock, 3 o'clock, and 6 o'clock using 2-0 Monocryl on a CT  needle.  There was excellent reapproximation and anatomical repair.  The  midline episiotomy which was remaining was then closed using 3-0  Monocryl in interrupted fashion, with deep sutures being placed and then  superficial sutures for skin approximation.  The patient tolerated it  well.  She had good relief with the epidural.  Epidural catheter was  removed, with the blue tip intact.  She will undergo routine postpartum  care.  She understands she is to be on a stool softener for 6 weeks.      Lazaro Arms, M.D.  Electronically Signed     LHE/MEDQ  D:  04/06/2006   T:  04/07/2006  Job:  [id number]

## 2010-06-01 NOTE — H&P (Signed)
NAMELYRAH, BRADT                  ACCOUNT NO.:  [id number]   MEDICAL RECORD NO.:  [id number]          PATIENT TYPE:  INP   LOCATION:  A328                          FACILITY:  [facility]   PHYSICIAN:  Donna Bernard, M.D.DATE OF BIRTH:  03/07/1990   DATE OF ADMISSION:  12/15/2003  DATE OF DISCHARGE:  LH                                HISTORY & PHYSICAL   CHIEF COMPLAINT:  Back pain, kidney infection.   SUBJECTIVE:  This patient is a 20 year old white female with a prior benign  medical history.  She presented to the office on Tuesday, three days prior  to admission, with increased frequency and dysuria.  The patient was started  on amoxicillin.  She presented back to the office approximately 12 hours  prior to admission with worsening low back pain. This was accompanied by  nausea and vomiting.  The patient had some difficulty keeping her  amoxicillin down.  On evaluation in the office, the patient was noted to  have right-sided kidney tenderness and pain.  In addition, her urinalysis  revealed multiple white blood cells.  The patient's history is complicated  by the fact that she has had chronic back pain.  She has been followed by  Dr. Romeo Apple for this.  She has been told she has bulging disks, and she  states that she has chronic ongoing pain with this, and some features of her  pain seem similar.  The patient has had no diarrhea.  She notes some pain  across the abdomen which is aching in nature and non-localizing. No  significant radiation discomfort.   CURRENT MEDICATIONS:  1.  Tylenol for the pain.  2.  Amoxicillin t.i.d. started Tuesday.  3.  Rocephin injection yesterday.   ALLERGIES:  None known.   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   PAST SURGICAL HISTORY:  No prior surgeries.   SOCIAL HISTORY:  No alcohol or smoking.  The patient states up to date on  vaccinations.   FAMILY HISTORY:  Noncontributory.   REVIEW OF  SYSTEMS:  Otherwise negative.   PHYSICAL EXAMINATION:  VITAL SIGNS:  Afebrile, normotensive.  GENERAL:  The patient is alert with some back discomfort, however,  HEENT:  Normal.  Oropharynx moist.  NECK:  Supple.  LUNGS:  Clear.  HEART: Regular rate and rhythm.  BACK:  Right CVA tenderness noted.  Diffuse low back tenderness to  percussion.  ABDOMEN:  Good bowel sounds.  Mild diffuse discomfort to deep palpation. No  rebound, no guarding.  PELVIC:  Not performed.  EXTREMITIES:  Normal.  SKIN:  Normal.   LABORATORY DATA:  Significant labs:  Multiple white blood cells in  urinalysis done in the office.  White blood cells in the urinalysis in the  hospital showed only 0 to 3; however, this was done 12 hours after admission  and 24 hours after Rocephin and 48 hours after initiation of amoxicillin.  CBC:  White blood count 12.5 initially, 25% lymphocytes, 5% monos, 68%  neutrophils.  MET-7 pending.   IMPRESSION:  1.  Urinary tract infection with right pyelonephritis.  2.  Back pain with presentation with exacerbation of patient's chronic pain.   PLAN:  1.  Admit for IV fluids, IV pain control, IV antibiotics.  2.  Further orders as noted on the chart.     Kristine Royal   WSL/MEDQ  D:  12/17/2003  T:  12/17/2003  Job:  [id number]

## 2010-06-01 NOTE — Discharge Summary (Signed)
NAMEDANYEAL, AKENS                  ACCOUNT NO.:  [id number]   MEDICAL RECORD NO.:  [id number]          PATIENT TYPE:  INP   LOCATION:  A328                          FACILITY:  [facility]   PHYSICIAN:  Scott A. Gerda Diss, MD    DATE OF BIRTH:  [birth year]   DATE OF ADMISSION:  12/15/2003  DATE OF DISCHARGE:  12/05/2005LH                                 DISCHARGE SUMMARY   DISCHARGE DIAGNOSES:  1.  Pyelonephritis.  2.  Urinary tract infection.  3.  Gastroenteritis secondary to #1.   HOSPITAL COURSE:  This 20 year old was admitted in with flank pain,  discomfort, dysuria.  She was treated first in the emergency department and  then she was treated in the office.  She failed that management and came in  during the night on the 3rd secondary to vomiting and inability to keep  things down.  Her white count at that time overall looked good.  Her  urinalysis in the office showed wbc's TNTC but in the ER was a low number.  She was placed on IV antibiotics and antibiotics and Phenergan.  Over the  course of the next couple of days, she had gradual improvement and she still  had continued discomfort, and she also had occasional vomiting spells at  night even though she was able to eat a wide variety of foods during the  day.  She was looking improved compared to her initial day of admission on  the 4th and on the 5th, she was felt to be very stable, abdomen soft, flank  nontender, no fever, and we felt she could be discharged home.   I discharged her to home on Omnicef 300 mg one twice a day for seven days.  Tylenol as needed.  Encourage plenty of fluids and a regular diet.  Resume  school on Tuesday and follow up in the office to see [place] in 10 to 14 days.  The patient was to call for appointment.     Scot   SAL/MEDQ  D:  12/19/2003  T:  12/19/2003  Job:  [id number]

## 2010-06-01 NOTE — H&P (Signed)
NAMESTESHA, NEYENS                  ACCOUNT NO.:  [id number]   MEDICAL RECORD NO.:  [id number]          PATIENT TYPE:  OIB   LOCATION:  A417                          FACILITY:  [facility]   PHYSICIAN:  Tilda Burrow, M.D. DATE OF BIRTH:  12-01-1990   DATE OF ADMISSION:  09/23/2005  DATE OF DISCHARGE:  LH                                HISTORY & PHYSICAL   REASON FOR ADMISSION:  Pregnancy at approximately 10 weeks with severe mid  and low back pain which is worse on the left side.   HISTORY OF PRESENT ILLNESS:  Millissa is a 20 year old, gravida 1, who is about  [redacted] weeks pregnant who has had severe pain in her flank and lower back since  last night.   PAST MEDICAL HISTORY:  Positive for:  1. Asthma.  2. Degenerative disk disease.  3. Kidney stones.  4. Spina bifida.   PAST SURGICAL HISTORY:  Positive for hand surgery in 2007.   ALLERGIES:  SULFA.   MEDICATIONS:  Prenatal vitamins.   FAMILY HISTORY:  Positive for coronary artery disease, diabetes, and  hypertension.   PHYSICAL EXAMINATION:  VITAL SIGNS: Weight 155. Blood pressure 150/60.  Urine is 3+ blood and 1+ leukocytes.  HEART:  Regular rate and rhythm.  LUNGS: Clear to auscultation bilaterally.  BACK: There is no CVA tenderness noted bilaterally.  GENERAL: The patient is very tearful and uncomfortable.   PLAN:  We are going to admit, get a CBC, get a catheterized urine, pain  management, and ultrasound to assess renal status.      Zerita Boers, Lanier Clam      Tilda Burrow, M.D.  Electronically Signed    DL/MEDQ  D:  04/54/0981  T:  09/23/2005  Job:  [id number]   cc:   [facility]

## 2010-06-01 NOTE — Consult Note (Signed)
NAMEKALLY, CADDEN                  ACCOUNT NO.:  [id number]   MEDICAL RECORD NO.:  [id number]          PATIENT TYPE:  OIB   LOCATION:  A415                          FACILITY:  [facility]   PHYSICIAN:  Tilda Burrow, M.D. DATE OF BIRTH:  1990/12/04   DATE OF CONSULTATION:  DATE OF DISCHARGE:                                 CONSULTATION   PROCEDURE:  Observation x12 hours.   CHIEF COMPLAINT:  This 20 year old primiparous presented approximately  10 a.m. complaining of uterine contractions. Cervix was thick, long,  enclosed. Contractions every 2-4 minutes of mild nature. Evaluation  showed normal fetal heart rate noted with instant membrane rupture.  Urinalysis was positive for having urinary tract infection with  additional urine drug screen finding of urine positive for amphetamines.  The patient has urine specific gravity greater than 1.030 with leukocyte  esterase present and small amount of hemoglobin. Microscopic showed 21-  50 red cells and a few bacteria.   HOSPITAL COURSE:  The patient received antibiotics and two doses of  terbutaline and remained stable through the night. Contractions  responded to oral terbutaline. She was discharged home after two doses  of Ancef and will receive Keflex 500 q.i.d. x7 days. She did not require  but one subcutaneous dose of terbutaline and was stable for discharge on  Keflex 500 q.i.d. x7 days and _Brethine 5 mg p.o. q.6 h. x24 hours.   FOLLOWUP:  Followup Tuesday [facility] with Rodena Piety-  Dishmon, C.N.M.      Tilda Burrow, M.D.  Electronically Signed     JVF/MEDQ  D:  02/17/2006  T:  02/17/2006  Job:  [id number]

## 2010-06-01 NOTE — H&P (Signed)
[name] [name], [name] [name]                  ACCOUNT NO.:  [id number]   MEDICAL RECORD NO.:  [id number]          PATIENT TYPE:  OBV   LOCATION:  A415                          FACILITY:  [facility]   PHYSICIAN:  Tilda Burrow, M.D. DATE OF BIRTH:  10-20-1990   DATE OF ADMISSION:  DATE OF DISCHARGE:  12/17/2007LH                              HISTORY & PHYSICAL   HISTORY OF PRESENT ILLNESS:  [name] [name] is a 20 year old gravida 1, para 0,  due April 06, 2006, who is in today at 26-2/7 weeks and states has been  having contractions now for two days.   ALLERGIES:  She is allergic to SULFA.   MEDICATIONS:  She is on prenatal vitamins.   PAST MEDICAL HISTORY:  1. Asthma.  2. Degenerative disc disease.  3. Kidney stones.   PAST SURGICAL HISTORY:  Hand laceration in 2001.   FAMILY HISTORY:  Positive for coronary artery disease, diabetes and  hypertension.   PRENATAL COURSE:  Essentially uneventful up to this point.  Blood type  is O positive.  UDS is negative.  Rubella immune.  Hepatitis B surface  antigen is negative.  HIV is negative.  Serology reactive.  Pap normal.  GC and Chlamydia negative.  AFP normal.   ADMISSION DIAGNOSIS:  Pregnancy at 26 weeks and two days with premature  uterine contractions.   PHYSICAL EXAMINATION:  Vital signs stable.  Weight is 170, blood  pressure 104/60, there is 1+ glucose in her urine.  __________ glucose  80.  Fetal heart rate 140, strong and regular.  Fundal height is 28 cm,  good fetal  movement noted.  Cervix is a dimple to closed, still very firm feeling,  mid position and presenting part is high.  A fetal fibronectin was  obtained prior to sterile vaginal exam.   PLAN:  We are going to admit and observe and possible tocolysis in  regards to any uterine contractions.      Zerita Boers, Reita Cliche, M.D.     DL/MEDQ  D:  98/11/9145  T:  12/30/2005  Job:  [id number]

## 2010-06-01 NOTE — Op Note (Signed)
[name] [name], [name] [name]                  ACCOUNT NO.:  [id number]   MEDICAL RECORD NO.:  [id number]          PATIENT TYPE:  INP   LOCATION:  A413                          FACILITY:  [facility]   PHYSICIAN:  Lazaro Arms, M.D.   DATE OF BIRTH:  10-Jan-1991   DATE OF PROCEDURE:  04/05/2006  DATE OF DISCHARGE:                                PROCEDURE NOTE   PROCEDURE:  Epidural catheter placement.   ATTENDING:  Lazaro Arms, M.D.   INDICATION:  Letricia is an 20 year old gravida 1 at 49 weeks' gestation  now, undergoing augmentation of labor with amniotomy and Pitocin  augmentation.  She has now progressed to 6 cm and she is requesting  epidural.  She has had a couple of doses of IV pain medicine.   DESCRIPTION OF PROCEDURE:  The patient was placed in sitting position.  Betadine prep was used.  Lidocaine 1% was injected as a local anesthetic  in the L3-L4 interspace.  A 17-gauge needle was used.  Loss-of-  resistance technique was employed and the epidural space was found with  1 pass without difficulty.  Ten milliliters of 0.125% bupivacaine plain  was given as a test dose without ill-effects.  Epidural catheter was  then fed and taped down 5 cm in the epidural space.  An additional 10 mL  of 0.125% bupivacaine were given and a continuous infusion was begun at  12 mL an hour.  The patient tolerated the procedure well.  She is  getting good pain relief.  Fetal heart rate tracing is stable and blood  pressure is stable.      Lazaro Arms, M.D.  Electronically Signed     LHE/MEDQ  D:  04/06/2006  T:  04/07/2006  Job:  [id number]

## 2010-06-02 ENCOUNTER — Emergency Department (HOSPITAL_COMMUNITY)
Admission: EM | Admit: 2010-06-02 | Discharge: 2010-06-02 | Disposition: A | Discharge status: Home / Self Care | Payer: BC Managed Care – PPO | Attending: Emergency Medicine | Admitting: Emergency Medicine

## 2010-06-02 ENCOUNTER — Emergency Department (HOSPITAL_COMMUNITY): Payer: BC Managed Care – PPO

## 2010-06-02 DIAGNOSIS — L03019 Cellulitis of unspecified finger: Secondary | ICD-10-CM | POA: Insufficient documentation

## 2010-06-02 DIAGNOSIS — L02519 Cutaneous abscess of unspecified hand: Secondary | ICD-10-CM | POA: Insufficient documentation

## 2010-06-02 DIAGNOSIS — M7989 Other specified soft tissue disorders: Secondary | ICD-10-CM | POA: Insufficient documentation

## 2010-06-02 DIAGNOSIS — S6990XA Unspecified injury of unspecified wrist, hand and finger(s), initial encounter: Secondary | ICD-10-CM | POA: Insufficient documentation

## 2010-06-02 DIAGNOSIS — W268XXA Contact with other sharp object(s), not elsewhere classified, initial encounter: Secondary | ICD-10-CM | POA: Insufficient documentation

## 2010-06-14 NOTE — Progress Notes (Signed)
Discussed case with Dr. Jena Gauss. Please send copy of this note with addendum to PCP.  No indication for repeat EGD at this time. EGD done in 01/2010.  Recommend take Dexilant 60mg  every day. Take Align one daily, may provide with samples, #14 to get her started. I recommend Gastric emptying study to further evaluate ugi symptoms given her polypharmacy. Please arrange.  OV in 4 weeks.

## 2010-06-15 NOTE — Progress Notes (Signed)
Tried to call pt- NA 

## 2010-06-18 ENCOUNTER — Other Ambulatory Visit: Payer: Self-pay | Admitting: Internal Medicine

## 2010-06-18 DIAGNOSIS — R1011 Right upper quadrant pain: Secondary | ICD-10-CM

## 2010-06-18 NOTE — Progress Notes (Signed)
Pt is scheduled for GES on 06/26/10 @ 8am. She is aware

## 2010-06-18 NOTE — Progress Notes (Signed)
Tried to call pt- LMOM 

## 2010-06-18 NOTE — Progress Notes (Signed)
Pt aware, samples at front desk.

## 2010-06-20 ENCOUNTER — Other Ambulatory Visit (HOSPITAL_COMMUNITY): Payer: BC Managed Care – PPO

## 2010-06-26 ENCOUNTER — Encounter (HOSPITAL_COMMUNITY)
Admission: RE | Admit: 2010-06-26 | Discharge: 2010-06-26 | Disposition: A | Discharge status: Home / Self Care | Payer: BC Managed Care – PPO | Source: Ambulatory Visit | Attending: Internal Medicine | Admitting: Internal Medicine

## 2010-06-26 ENCOUNTER — Encounter (HOSPITAL_COMMUNITY): Payer: Self-pay

## 2010-06-26 ENCOUNTER — Telehealth: Payer: Self-pay | Admitting: Gastroenterology

## 2010-06-26 DIAGNOSIS — R1011 Right upper quadrant pain: Secondary | ICD-10-CM

## 2010-06-26 MED ORDER — TECHNETIUM TC 99M SULFUR COLLOID
2.0000 | Freq: Once | INTRAVENOUS | Status: AC | PRN
  Administered 2010-06-26: 2 via INTRAVENOUS

## 2010-06-26 NOTE — Progress Notes (Signed)
[facility] called office to let us know that they were unable to do the test on pt today and didn't get any images.

## 2010-06-28 NOTE — Telephone Encounter (Signed)
Can they try again?  What was the problem?

## 2010-06-28 NOTE — Telephone Encounter (Signed)
Find out why test could not be done.

## 2010-06-29 NOTE — Telephone Encounter (Signed)
Tried to call [facility] to find out why test was not done, got voicemail, left message

## 2010-07-04 NOTE — Telephone Encounter (Signed)
I would recommend patient to either reschedule GES or OV here. If she prefers oatmeal to egg they should be able to substitute. If pt does not reschedule then OV here (if still having problems).

## 2010-07-04 NOTE — Telephone Encounter (Signed)
Please f/u on this.

## 2010-07-04 NOTE — Telephone Encounter (Signed)
Talked to [facility] in [place] med, he stated pt took two bites of the egg and started vomiting so they cancelled the test and wasn't sure if we wanted to reschedule or not. Please advise.

## 2010-07-05 NOTE — Telephone Encounter (Signed)
Schedule OV please

## 2010-07-05 NOTE — Telephone Encounter (Signed)
Spoke with pt- she doesn't eat eggs or oatmeal. She would like to have ov to discuss with lsl what she needs to do.

## 2010-07-06 NOTE — Telephone Encounter (Signed)
LMOM for pt to come to office on 6/27 @ 2pm with LSL for OV

## 2010-07-10 NOTE — Telephone Encounter (Signed)
Pt is aware of OV on 7/2 @ 0900 with LSL

## 2010-07-11 ENCOUNTER — Ambulatory Visit: Payer: BC Managed Care – PPO | Admitting: Gastroenterology

## 2010-07-16 ENCOUNTER — Encounter: Payer: Self-pay | Admitting: Gastroenterology

## 2010-07-16 ENCOUNTER — Ambulatory Visit (INDEPENDENT_AMBULATORY_CARE_PROVIDER_SITE_OTHER): Payer: BC Managed Care – PPO | Admitting: Gastroenterology

## 2010-07-16 DIAGNOSIS — R1033 Periumbilical pain: Secondary | ICD-10-CM

## 2010-07-16 DIAGNOSIS — R112 Nausea with vomiting, unspecified: Secondary | ICD-10-CM

## 2010-07-16 MED ORDER — ONDANSETRON 4 MG PO TBDP: 4.0000 mg | ORAL_TABLET | Freq: Three times a day (TID) | ORAL | Status: AC | PRN

## 2010-07-16 MED ORDER — DEXLANSOPRAZOLE 60 MG PO CPDR: DELAYED_RELEASE_CAPSULE | ORAL | Status: DC

## 2010-07-16 NOTE — Assessment & Plan Note (Signed)
Intermittent nausea and vomiting since last December. Some of this during high school as well. May go several weeks at a time without any problems. Last episode started on Friday. EGD recently showed chronic gastritis and erosive reflux esophagitis. She tells me she's been taking Dexilant every single day. She generally takes it at bedtime because her reflux symptoms are more predominant in the mornings. We will unable to rule out gastroparesis as patient was not able to tolerate the test. She's not interested in attempting it again. Would not empirically start Reglan without documentation of gastroparesis and side effect profile to risk a period. She could easily have gastroparesis given her chronic narcotic therapy however.  Increase PPI to twice daily for the next 4 weeks. If no improvement and vomiting she'll let us know. Zofran for the immediate future to control symptoms.

## 2010-07-16 NOTE — Patient Instructions (Signed)
Please call if N/V persistent.  Increase you Dexilant to twice daily, once before breakfast and once before evening meal for next four weeks, then go back to once daily dosing.  I will discuss your case with the physicians and make further recommendations in near future.

## 2010-07-16 NOTE — Progress Notes (Signed)
AGREE

## 2010-07-16 NOTE — Progress Notes (Signed)
Cc to Dr. Lilyan Punt

## 2010-07-16 NOTE — Progress Notes (Signed)
Primary Care Physician: Lilyan Punt, MD, MD  Primary Gastroenterologist:  Roetta Sessions, MD  Chief Complaint  Patient presents with  . Abdominal Pain    HPI: [name] [name] is a 20 y.o. female here for followup. She was unable to complete the gastric emptying study. She states she just simply can't eat eggs the way they were prepared. She cannot tolerate oatmeal. She's not interested in attempting the study again. She continues to have intermittent abdominal pain associate with vomiting. Episodes once every couple months then sick for one week. This episode started Friday. Starts with N/V, then develops mid-abdominal pain. Vomiting will be all day. BM sometimes diarrhea/constipation. No blood in stools. No recent antibiotics. Taking Dexilant at nighttime since heartburn most prevalent first thing in morning. Schedule working for her. No heartburn. Some indigestion. Taking oxycodone five times daily for the fibromyalgia. Not taking anything for the n/v right now. No food today, vomiting water. Urine is dark yellow X 1 this am. Denies dizziness.  She tells me these symptoms have been persistent now since around December. However upon further questioning she is to have intermittent episodes of vomiting while she was in high school.   She had an EGD and colonoscopy with ileoscopy in January 2012, several week history of vomiting, diarrhea. She had erosive reflux esophagitis, chronic gastritis but no H. pylori. Terminal ileum looked good, she had some nonspecific changes of the ascending colon felt to possibly be a resolving infection. Her biopsies were unremarkable.   HIDA normal 12/2009  Abd U/S 12/2009, fatty liver  CT A/P with contrast, 12/2009, right nonobstructing 3mm calculi (2), diverticulosis  Labs from May 2012, unremarkable CBC, LFTs, lipase.   Current Outpatient Prescriptions  Medication Sig Dispense Refill  . albuterol (PROVENTIL) (2.5 MG/3ML) 0.083% nebulizer solution Take 2.5 mg  by nebulization every 6 (six) hours as needed.        . cyclobenzaprine (FLEXERIL) 10 MG tablet Take 10 mg by mouth 3 (three) times daily as needed.        Marland Kitchen dexlansoprazole (DEXILANT) 60 MG capsule Increase Dexilant to 60mg  before breakfast and before evening meal for next four weeks then go back to once daily.  30 capsule  0  . DULoxetine (CYMBALTA) 60 MG capsule Take 60 mg by mouth daily.        Marland Kitchen etonogestrel-ethinyl estradiol (NUVARING) 0.12-0.015 MG/24HR vaginal ring Place 1 each vaginally every 28 (twenty-eight) days. Insert vaginally and leave in place for 3 consecutive weeks, then remove for 1 week.       . Fluticasone-Salmeterol (ADVAIR DISKUS) 100-50 MCG/DOSE AEPB Inhale 1 puff into the lungs every 12 (twelve) hours.        Maximino Green[place] IN Inhale into the lungs as needed.        Marland Kitchen oxyCODONE-acetaminophen (PERCOCET) 10-650 MG per tablet Take 1 tablet by mouth every 6 (six) hours as needed.        . pregabalin (LYRICA) 75 MG capsule Take 75 mg by mouth 2 (two) times daily.        Marland Kitchen DISCONTD: dexlansoprazole (DEXILANT) 60 MG capsule Take 1 capsule (60 mg total) by mouth daily.  30 capsule  5  . dicyclomine (BENTYL) 10 MG capsule Take 10 mg by mouth 3 (three) times daily before meals.       Marland Kitchen HYDROcodone-acetaminophen (VICODIN) 5-500 MG per tablet Take 1 tablet by mouth every 4 (four) hours as needed.        . hyoscyamine (LEVSIN SL) 0.125 MG  SL tablet Place 0.125 mg under the tongue every 4 (four) hours as needed.        . ondansetron (ZOFRAN ODT) 4 MG disintegrating tablet Take 1 tablet (4 mg total) by mouth every 8 (eight) hours as needed for nausea.  20 tablet  0  .           Allergies as of 07/16/2010 - Review Complete 07/16/2010  Allergen Reaction Noted  . Sulfonamide derivatives      ROS:  General: Negative for anorexia, weight loss, fever, chills, fatigue, weakness. ENT: Negative for hoarseness, difficulty swallowing , nasal congestion. CV: Negative for chest pain,  angina, palpitations, dyspnea on exertion, peripheral edema.  Respiratory: Negative for dyspnea at rest, dyspnea on exertion, cough, sputum, wheezing.  GI: See history of present illness. GU:  Negative for dysuria, hematuria, urinary incontinence, urinary frequency, nocturnal urination.  Endo: Negative for unusual weight change.    Physical Examination:   BP 130/93  Pulse 106  Temp(Src) 97.8 F (36.6 C) (Temporal)  Ht 5' (1.524 m)  Wt 234 lb 6.4 oz (106.323 kg)  BMI 45.78 kg/m2  LMP 06/18/2010  General: Well-nourished, well-developed in no acute distress. Tearful. Eyes: No icterus. Mouth: Oropharyngeal mucosa moist and pink , no lesions erythema or exudate. Lungs: Clear to auscultation bilaterally.  Heart: Regular rate and rhythm, no murmurs rubs or gallops.  Abdomen: Bowel sounds are normal, nontender, nondistended, no hepatosplenomegaly or masses, no abdominal bruits or hernia , no rebound or guarding.   Extremities: No lower extremity edema.  Neuro: Alert and oriented x 4   Skin: Warm and dry, no jaundice.   Psych: Alert and cooperative, normal mood and affect.

## 2010-07-16 NOTE — Assessment & Plan Note (Signed)
Mild associated midabdominal pain. Pain starts after the vomiting. Really denies any associated with bowel function. See nausea vomiting for assessment and plan.

## 2010-07-30 ENCOUNTER — Telehealth: Payer: Self-pay

## 2010-07-30 NOTE — Telephone Encounter (Signed)
Pt called and said she has been having the right sided abd pain with nausea for the last 2 days. Please advise!

## 2010-07-31 ENCOUNTER — Other Ambulatory Visit: Payer: Self-pay | Admitting: Family Medicine

## 2010-07-31 DIAGNOSIS — IMO0002 Reserved for concepts with insufficient information to code with codable children: Secondary | ICD-10-CM

## 2010-07-31 NOTE — Telephone Encounter (Signed)
LM for pt to call

## 2010-07-31 NOTE — Telephone Encounter (Signed)
Discussed with Dr. Jena Gauss.  He recommends Hydrogen Breath Test (diagnosis of abdominal pain, vomiting, diarrhea) prior to consideration of tertiary care center referral.

## 2010-08-01 ENCOUNTER — Ambulatory Visit (HOSPITAL_COMMUNITY)
Admission: RE | Admit: 2010-08-01 | Discharge: 2010-08-01 | Disposition: A | Discharge status: Home / Self Care | Payer: BC Managed Care – PPO | Source: Ambulatory Visit | Attending: Family Medicine | Admitting: Family Medicine

## 2010-08-01 DIAGNOSIS — N63 Unspecified lump in unspecified breast: Secondary | ICD-10-CM | POA: Insufficient documentation

## 2010-08-01 DIAGNOSIS — IMO0002 Reserved for concepts with insufficient information to code with codable children: Secondary | ICD-10-CM

## 2010-08-01 NOTE — Telephone Encounter (Signed)
Informed pt. She said it might be later in day before you can reach her to schedule the Hydrogen Breath Test. She is at [facility] getting ready to have [place], PCP found knot in her right breast.

## 2010-08-02 NOTE — Telephone Encounter (Signed)
Pt is scheduled for HBT on 08/06/2010@9 :00AM.  Pt is aware of appt. Instructions placed in the mail.  I also went over instructions with the pt on the phone.

## 2010-08-06 ENCOUNTER — Encounter (HOSPITAL_COMMUNITY): Payer: Self-pay | Admitting: *Deleted

## 2010-08-06 ENCOUNTER — Ambulatory Visit (HOSPITAL_COMMUNITY)
Admission: RE | Admit: 2010-08-06 | Discharge: 2010-08-06 | Disposition: A | Discharge status: Home / Self Care | Payer: BC Managed Care – PPO | Source: Ambulatory Visit | Attending: Internal Medicine | Admitting: Internal Medicine

## 2010-08-06 ENCOUNTER — Encounter (HOSPITAL_COMMUNITY)
Admission: RE | Disposition: A | Discharge status: Home / Self Care | Payer: Self-pay | Source: Ambulatory Visit | Attending: Internal Medicine

## 2010-08-06 DIAGNOSIS — R197 Diarrhea, unspecified: Secondary | ICD-10-CM | POA: Insufficient documentation

## 2010-08-06 DIAGNOSIS — R1110 Vomiting, unspecified: Secondary | ICD-10-CM | POA: Insufficient documentation

## 2010-08-06 DIAGNOSIS — R109 Unspecified abdominal pain: Secondary | ICD-10-CM | POA: Insufficient documentation

## 2010-08-06 HISTORY — PX: HYDROGEN BREATH TEST: SHX5529

## 2010-08-06 SURGERY — HYDROGEN BREATH TEST
Anesthesia: Choice

## 2010-08-06 MED ORDER — LACTULOSE 10 GM/15ML PO SOLN
ORAL | Status: AC
  Filled 2010-08-06: qty 60

## 2010-08-06 MED ORDER — LACTULOSE 10 GM/15ML PO SOLN
37.5000 g | Freq: Once | ORAL | Status: AC
  Administered 2010-08-06: 37.5 g via ORAL

## 2010-08-06 NOTE — Pre-Procedure Instructions (Signed)
Pt arrived in short stay for HBT. Denies any high fiber intake past 24 hrs. Denies smoking, sleep, or exercising past 30 min. Verifies npo status past 12 hrs.Denies antibiotics or diarrhea . Procedure explained--verified understanding.

## 2010-08-07 ENCOUNTER — Telehealth: Payer: Self-pay

## 2010-08-07 NOTE — Telephone Encounter (Signed)
Pt called- she had hydrogen breath test done yesterday. She felt fine when she left but after she got home she started having nausea and cannot keep any food down. She said she can keep some liquids down but not a lot. Pt also has diarrhea and some abd pain. She is not c/o fever. Wants to know what she should do and if we have test results back yet.

## 2010-08-07 NOTE — Telephone Encounter (Signed)
Called patient and told her what Verlon Au said. I will call in Zofran to [facility] in [place] for her.

## 2010-08-07 NOTE — Telephone Encounter (Signed)
Supportive measures. Take Zofran ODT 4mg , one every 4-6 hours prn, #20, o refills. May use her bentyl or add imodium 2mg  tid prn diarrhea.  Await hydrogen breath test results.

## 2010-08-08 ENCOUNTER — Encounter (HOSPITAL_COMMUNITY): Payer: Self-pay | Admitting: Gastroenterology

## 2010-08-08 DIAGNOSIS — R197 Diarrhea, unspecified: Secondary | ICD-10-CM

## 2010-08-08 DIAGNOSIS — R143 Flatulence: Secondary | ICD-10-CM

## 2010-08-08 DIAGNOSIS — R142 Eructation: Secondary | ICD-10-CM

## 2010-08-08 DIAGNOSIS — R141 Gas pain: Secondary | ICD-10-CM

## 2010-08-08 NOTE — Procedures (Signed)
PHYSICIAN:  Roetta Sessions, MD   DATE OF PROCEDURE: 08/06/2010   DATE OF DISCHARGE: 08/06/2010  PROCEDURE: Hydrogen breath test  INDICATION FOR EXAMINATION: [name] [name] is a 20 y/o with chronic intermittent vomiting, diarrhea, abdominal cramping. Extensive work-up including EGD/TCS, CT A/P, Abd U/S, HIDA have failed to reveal cause of symptoms. Patient was not able to tolerate GES due to food dislikes. She was not willing to attempt the test. She is on chronic narcotics for fibromyalgia. Symptoms date back for more than five years.    PRE-PROCEDURE CHECK: Patient denied beans, grain, and fiber in the past 24 hours. She has been NPO for 12 hours. She denies smoking, sleep, or exercise within the past 30 minutes. She denied antibiotics within past two weeks. Denies recent bowel prep or laxatives. Has intermittent diarrhea at baseline.     TEST SUGAR: Lactulose 37.5 grams  FINDINGS: Initial breathalyzer read 0.1 PPM. Samples were taken every 15 minutes for 12 readings. Readings were essentially flatline without any peaks. Highest level was at 150 minutes, 0.3 PPM.   Please note, at time of test, patient was not willing to stand for readings. Per staff, she remained reclined and did not seem to take deep inhalations/exhalations for the readings.   ASSESSMENT: No rise in breath hydrogen. Not consistent with small bowel bacterial overgrowth.   CC: [name] Punt, MD, MD   Will plan on sending patient for GI opinion at [facility].

## 2010-08-09 ENCOUNTER — Encounter (HOSPITAL_COMMUNITY): Payer: Self-pay | Admitting: *Deleted

## 2010-08-09 ENCOUNTER — Emergency Department (HOSPITAL_COMMUNITY): Payer: BC Managed Care – PPO

## 2010-08-09 ENCOUNTER — Emergency Department (HOSPITAL_COMMUNITY)
Admission: EM | Admit: 2010-08-09 | Discharge: 2010-08-09 | Disposition: A | Discharge status: Home / Self Care | Payer: BC Managed Care – PPO | Attending: Emergency Medicine | Admitting: Emergency Medicine

## 2010-08-09 DIAGNOSIS — F17200 Nicotine dependence, unspecified, uncomplicated: Secondary | ICD-10-CM | POA: Insufficient documentation

## 2010-08-09 DIAGNOSIS — K219 Gastro-esophageal reflux disease without esophagitis: Secondary | ICD-10-CM | POA: Insufficient documentation

## 2010-08-09 DIAGNOSIS — IMO0001 Reserved for inherently not codable concepts without codable children: Secondary | ICD-10-CM | POA: Insufficient documentation

## 2010-08-09 DIAGNOSIS — R112 Nausea with vomiting, unspecified: Secondary | ICD-10-CM | POA: Insufficient documentation

## 2010-08-09 DIAGNOSIS — J45909 Unspecified asthma, uncomplicated: Secondary | ICD-10-CM | POA: Insufficient documentation

## 2010-08-09 DIAGNOSIS — R109 Unspecified abdominal pain: Secondary | ICD-10-CM | POA: Insufficient documentation

## 2010-08-09 DIAGNOSIS — M549 Dorsalgia, unspecified: Secondary | ICD-10-CM | POA: Insufficient documentation

## 2010-08-09 DIAGNOSIS — G8929 Other chronic pain: Secondary | ICD-10-CM

## 2010-08-09 DIAGNOSIS — Z87442 Personal history of urinary calculi: Secondary | ICD-10-CM | POA: Insufficient documentation

## 2010-08-09 LAB — COMPREHENSIVE METABOLIC PANEL
ALT: 14 U/L (ref 0–35)
Alkaline Phosphatase: 81 U/L (ref 39–117)
CO2: 17 mEq/L — ABNORMAL LOW (ref 19–32)
Calcium: 9.5 mg/dL (ref 8.4–10.5)
Chloride: 95 mEq/L — ABNORMAL LOW (ref 96–112)
GFR calc Af Amer: >60 mL/min (ref >60)
GFR calc non Af Amer: >60 mL/min (ref >60)
Glucose, Bld: 74 mg/dL (ref 70–99)
Potassium: 4 mEq/L (ref 3.5–5.1)
Sodium: 132 mEq/L — ABNORMAL LOW (ref 135–145)
Total Bilirubin: 0.2 mg/dL — ABNORMAL LOW (ref 0.3–1.2)

## 2010-08-09 LAB — DIFFERENTIAL
Eosinophils Relative: 4 % (ref 0–5)
Lymphocytes Relative: 23 % (ref 12–46)
Lymphs Abs: 2.2 10*3/uL (ref 0.7–4.0)
Neutro Abs: 5.9 10*3/uL (ref 1.7–7.7)

## 2010-08-09 LAB — URINALYSIS, ROUTINE W REFLEX MICROSCOPIC
Ketones, ur: >80 mg/dL — AB
Leukocytes, UA: NEGATIVE
Nitrite: NEGATIVE
Specific Gravity, Urine: >1.03 — ABNORMAL HIGH (ref 1.005–1.030)
pH: 5.5 (ref 5.0–8.0)

## 2010-08-09 LAB — CBC
MCV: 82.1 fL (ref 78.0–100.0)
Platelets: 317 10*3/uL (ref 150–400)
RBC: 5.32 MIL/uL — ABNORMAL HIGH (ref 3.87–5.11)
WBC: 9.2 10*3/uL (ref 4.0–10.5)

## 2010-08-09 LAB — POCT PREGNANCY, URINE: Preg Test, Ur: NEGATIVE

## 2010-08-09 LAB — URINE MICROSCOPIC-ADD ON

## 2010-08-09 MED ORDER — MORPHINE SULFATE 4 MG/ML IJ SOLN
4.0000 mg | INTRAMUSCULAR | Status: DC | PRN
  Administered 2010-08-09: 4 mg via INTRAVENOUS
  Filled 2010-08-09: qty 1

## 2010-08-09 MED ORDER — PROMETHAZINE HCL 25 MG PO TABS: 25.0000 mg | ORAL_TABLET | Freq: Four times a day (QID) | ORAL | Status: DC | PRN

## 2010-08-09 MED ORDER — ONDANSETRON HCL 4 MG/2ML IJ SOLN
4.0000 mg | INTRAMUSCULAR | Status: DC | PRN
  Administered 2010-08-09: 4 mg via INTRAVENOUS
  Filled 2010-08-09: qty 2

## 2010-08-09 MED ORDER — SODIUM CHLORIDE 0.9 % IV SOLN
INTRAVENOUS | Status: DC
  Administered 2010-08-09: 18:00:00 via INTRAVENOUS

## 2010-08-09 MED ORDER — SODIUM CHLORIDE 0.9 % IV BOLUS (SEPSIS): 500.0000 mL | Freq: Once | INTRAVENOUS | Status: DC

## 2010-08-09 MED ORDER — FAMOTIDINE IN NACL 20-0.9 MG/50ML-% IV SOLN
20.0000 mg | Freq: Once | INTRAVENOUS | Status: AC
  Administered 2010-08-09: 20 mg via INTRAVENOUS
  Filled 2010-08-09: qty 50

## 2010-08-09 MED ORDER — OXYCODONE-ACETAMINOPHEN 5-325 MG PO TABS
1.0000 | ORAL_TABLET | Freq: Once | ORAL | Status: AC
  Administered 2010-08-09: 1 via ORAL
  Filled 2010-08-09: qty 1

## 2010-08-09 NOTE — Telephone Encounter (Signed)
She needs to go to ED for hydration and antiemetics. Failed outpatient management.

## 2010-08-09 NOTE — Telephone Encounter (Signed)
Pt called- still unable to keep anything down. Now vomiting liquids. zofran has helped a little. Pt is having a decreased urine output. I advised pt to go to ED. She stated she didn't want to go to ED because her insurance wouldn't pay for it. Please advise.

## 2010-08-09 NOTE — Telephone Encounter (Signed)
Agree 

## 2010-08-09 NOTE — ED Notes (Signed)
Pt c/o pain to IV site; IV placement verified by blood drawn and flushed without difficulty

## 2010-08-09 NOTE — ED Provider Notes (Signed)
History     Chief Complaint  Patient presents with  . Emesis    x 3 days   HPI  Pt was seen at 1815.  Per pt, c/o gradual onset and persistence of constant acute flair of her chronic upper abd pain x8 months, worse over the past 3 days.  Has been assoc with multiple episodes of N/V, unrelieved by PO zofran rx by her GI MD Kendell Bane.  Pt states she has been eval by GI MD multiple times over the past 8 months, with extensive testing competed, including: CT, [place], HIDA, EGD, colonoscopy without definitive dx.  Endorses she had "a hydrogen breath test done" 3d ago, which caused an acute flair of her chronic symptoms.  Denies any change in her usual chronic symptom pattern.  Denies CP/SOB, no cough, no fevers, no back pain, no rash, no diarrhea.     Past Medical History  Diagnosis Date  . Fibromyalgia   . Asthma   . Syringomyelia     thoracic spine  . GERD (gastroesophageal reflux disease)   . Chronic pain     Dr. Murray Hodgkins in [place]  . Renal disorder     kidney stones    Past Surgical History  Procedure Date  . Renal lithiasis removed   . Esophagogastroduodenoscopy 01/2010    circumferential distal esophageal erosions, hiatal hernia, excoriations/erosion in gastric body ?trauma from vomiting, bx negative and showed chronic gastritis  . Colonoscopy 01/2010    scattered petechiae and fibrotic-appearing mucosa of uncertain significance , ?resolving infection, bx benign  . Breath hydrogen test 08/08/2010         Family History  Problem Relation Age of Onset  . Multiple sclerosis Father   . Cervical cancer Mother     History  Substance Use Topics  . Smoking status: Current Everyday Smoker -- 0.5 packs/day for 10 years    Types: Cigarettes  . Smokeless tobacco: Not on file  . Alcohol Use: No     denies    OB History    Grav Para Term Preterm Abortions TAB SAB Ect Mult Living                  Review of Systems ROS: Statement: All systems negative except as marked or noted in the  HPI; Constitutional: Negative for fever and chills. ; ; Eyes: Negative for eye pain and discharge. ; ; ENMT: Negative for ear pain, hoarseness, nasal congestion, sinus pressure and sore throat. ; ; Cardiovascular: Negative for chest pain, palpitations, diaphoresis, dyspnea and peripheral edema. ; ; Respiratory: Negative for cough, wheezing and stridor. ; ; Gastrointestinal: Negative for nausea, vomiting,and abdominal pain. Denies diarrhea; ; Genitourinary: Negative for dysuria, flank pain and hematuria. ; ; Musculoskeletal: Negative for back pain and neck pain. ; ; Skin: Negative for rash and skin lesion. ; ; Neuro: Negative for headache, lightheadedness and neck stiffness. ;     Physical Exam  BP 145/88  Pulse 72  Temp(Src) 98.4 F (36.9 C) (Oral)  Resp 20  Ht 5\' 4"  (1.626 m)  Wt 231 lb (104.781 kg)  BMI 39.65 kg/m2  SpO2 100%  LMP 07/15/2010  Physical Exam 1825: Physical examination:  Nursing notes reviewed; Vital signs and O2 SAT reviewed;  Constitutional: Well developed, Well nourished, Well hydrated, In no acute distress; Head:  Normocephalic, atraumatic; Eyes: EOMI, PERRL, No scleral icterus; ENMT: Mouth and pharynx normal, Mucous membranes moist; Neck: Supple, Full range of motion, No lymphadenopathy; Cardiovascular: Regular rate and rhythm, No  murmur, rub, or gallop; Respiratory: Breath sounds clear & equal bilaterally, No rales, rhonchi, wheezes, or rub, Normal respiratory effort/excursion; Chest: Nontender, Movement normal; Abdomen: Soft, +tender RUQ, mid-epigastrum, LUQ to palp.  No rebound or guarding, Nondistended, Normal bowel sounds; Genitourinary: No CVA tenderness; Extremities: Pulses normal, No tenderness, No edema, No calf edema or asymmetry.; Neuro: AA&Ox3, Major CN grossly intact.  No gross focal motor or sensory deficits in extremities.; Skin: Color normal, Warm, Dry.   ED Course  Procedures  MDM MDM Reviewed: previous chart, nursing note and vitals Reviewed previous:  CT scan and ultrasound (EGD, colonoscopy) Interpretation: labs and x-ray   Results for orders placed during the hospital encounter of 08/09/10  CBC      Component Value Range   WBC 9.2  4.0 - 10.5 (K/uL)   RBC 5.32 (*) 3.87 - 5.11 (MIL/uL)   Hemoglobin 15.6 (*) 12.0 - 15.0 (g/dL)   HCT 16.1  09.6 - 04.5 (%)   MCV 82.1  78.0 - 100.0 (fL)   MCH 29.3  26.0 - 34.0 (pg)   MCHC 35.7  30.0 - 36.0 (g/dL)   RDW 40.9  81.1 - 91.4 (%)   Platelets 317  150 - 400 (K/uL)  DIFFERENTIAL      Component Value Range   Neutrophils Relative 64  43 - 77 (%)   Neutro Abs 5.9  1.7 - 7.7 (K/uL)   Lymphocytes Relative 23  12 - 46 (%)   Lymphs Abs 2.2  0.7 - 4.0 (K/uL)   Monocytes Relative 8  3 - 12 (%)   Monocytes Absolute 0.7  0.1 - 1.0 (K/uL)   Eosinophils Relative 4  0 - 5 (%)   Eosinophils Absolute 0.4  0.0 - 0.7 (K/uL)   Basophils Relative 1  0 - 1 (%)   Basophils Absolute 0.1  0.0 - 0.1 (K/uL)  COMPREHENSIVE METABOLIC PANEL      Component Value Range   Sodium 132 (*) 135 - 145 (mEq/L)   Potassium 4.0  3.5 - 5.1 (mEq/L)   Chloride 95 (*) 96 - 112 (mEq/L)   CO2 17 (*) 19 - 32 (mEq/L)   Glucose, Bld 74  70 - 99 (mg/dL)   BUN 13  6 - 23 (mg/dL)   Creatinine, Ser 7.82  0.50 - 1.10 (mg/dL)   Calcium 9.5  8.4 - 95.6 (mg/dL)   Total Protein 7.8  6.0 - 8.3 (g/dL)   Albumin 3.5  3.5 - 5.2 (g/dL)   AST 15  0 - 37 (U/L)   ALT 14  0 - 35 (U/L)   Alkaline Phosphatase 81  39 - 117 (U/L)   Total Bilirubin 0.2 (*) 0.3 - 1.2 (mg/dL)   GFR calc non Af Amer >60  >60 (mL/min)   GFR calc Af Amer >60  >60 (mL/min)  URINALYSIS, ROUTINE W REFLEX MICROSCOPIC      Component Value Range   Color, Urine YELLOW  YELLOW    Appearance CLEAR  CLEAR    Specific Gravity, Urine >1.030 (*) 1.005 - 1.030    pH 5.5  5.0 - 8.0    Glucose, UA NEGATIVE  NEGATIVE (mg/dL)   Hgb urine dipstick MODERATE (*) NEGATIVE    Bilirubin Urine SMALL (*) NEGATIVE    Ketones, ur >80 (*) NEGATIVE (mg/dL)   Protein, ur NEGATIVE  NEGATIVE  (mg/dL)   Urobilinogen, UA 0.2  0.0 - 1.0 (mg/dL)   Nitrite NEGATIVE  NEGATIVE    Leukocytes, UA NEGATIVE  NEGATIVE  LIPASE, BLOOD      Component Value Range   Lipase 16  11 - 59 (U/L)  POCT PREGNANCY, URINE      Component Value Range   Preg Test, Ur NEGATIVE    URINE MICROSCOPIC-ADD ON      Component Value Range   Squamous Epithelial / LPF FEW (*) RARE    WBC, UA 0-2  <3 (WBC/hpf)   RBC / HPF 7-10  <3 (RBC/hpf)   Bacteria, UA RARE  RARE     Dg Abd Acute W/chest  08/09/2010  *RADIOLOGY REPORT*  Clinical Data: Evaluate for small bowel obstruction or free air. Abdominal pain with nausea and vomiting since Monday.  ACUTE ABDOMEN SERIES (ABDOMEN 2 VIEW & CHEST 1 VIEW)  Comparison:  None.  Findings:  There is no evidence of dilated bowel loops or free intraperitoneal air.  No radiopaque calculi or other significant radiographic abnormality is seen. Heart size and mediastinal contours are within normal limits.  Both lungs are clear.  IMPRESSION: Negative abdominal radiographs.  No acute cardiopulmonary disease.  Original Report Authenticated By: Elsie Stain, M.D.    2052:  Improved after meds.  Has tol PO well in ED without N/V and wants to go home now.  Dx testing d/w pt and family.  Questions answered.  Verb understanding, agreeable to d/c home with outpt f/u.        Viliami Bracco Allison Quarry, DO 08/10/10 1737

## 2010-08-09 NOTE — ED Notes (Signed)
C/o nausea and vomiting x 3 days after having hydrogen breath test done; was given Rx antiemetic by PCP, but states this is not helping

## 2010-08-09 NOTE — Telephone Encounter (Signed)
Patient called back wanting to know what to do. I advised her to go to the ER like Verlon Au stated and the patient stated that she was able to keep down one cup of water. I told her that was not enough to hydrate her and she should go to the ER and the patient said ok.

## 2010-08-13 NOTE — Procedures (Addendum)
Normal HBT. See separate report.

## 2010-08-14 ENCOUNTER — Encounter (HOSPITAL_COMMUNITY): Payer: Self-pay | Admitting: Internal Medicine

## 2010-08-14 ENCOUNTER — Encounter: Payer: Self-pay | Admitting: Gastroenterology

## 2010-08-14 NOTE — Progress Notes (Signed)
Pt is aware.  

## 2010-08-14 NOTE — Progress Notes (Signed)
Pt never came and picked up samples of align. Samples were put back into sample closet.

## 2010-08-14 NOTE — Progress Notes (Unsigned)
  Please let patient know her HBT was negative. Discussed with Dr. Jena Gauss previously. Needs referral to [facility] for GI opinion regarding chronic abdominal pain, vomiting, diarrhea. Please make arrangements and send copy of all procedural notes (EGD/TCS and HBT), OV notes, labs. Thanks.

## 2010-08-15 NOTE — Progress Notes (Signed)
I called pt lmom about appt.

## 2010-08-15 NOTE — Progress Notes (Signed)
Pt has an appt. At [facility] 09/28/2010@8 :30am. With Dr. Lorenda Peck.

## 2010-10-16 LAB — URINE MICROSCOPIC-ADD ON

## 2010-10-16 LAB — POCT I-STAT, CHEM 8
BUN: 13
Chloride: 107
Creatinine, Ser: 0.9
Glucose, Bld: 109 — ABNORMAL HIGH
Potassium: 4.1
Sodium: 139

## 2010-10-16 LAB — URINALYSIS, ROUTINE W REFLEX MICROSCOPIC
Bilirubin Urine: NEGATIVE
Glucose, UA: NEGATIVE
Ketones, ur: NEGATIVE
Nitrite: NEGATIVE
pH: 5.5

## 2010-10-16 LAB — CBC
HCT: 42
Hemoglobin: 14.6
MCV: 83.8
Platelets: 260
WBC: 9.6

## 2010-10-16 LAB — DIFFERENTIAL
Eosinophils Absolute: 0.8 — ABNORMAL HIGH
Eosinophils Relative: 8 — ABNORMAL HIGH
Lymphs Abs: 2.5
Monocytes Absolute: 0.5

## 2010-10-16 LAB — URINE CULTURE: Colony Count: >=100000

## 2010-10-16 LAB — D-DIMER, QUANTITATIVE: D-Dimer, Quant: 0.69 — ABNORMAL HIGH

## 2010-12-12 ENCOUNTER — Other Ambulatory Visit: Payer: Self-pay | Admitting: Family Medicine

## 2010-12-12 ENCOUNTER — Ambulatory Visit (HOSPITAL_COMMUNITY)
Admission: RE | Admit: 2010-12-12 | Discharge: 2010-12-12 | Disposition: A | Discharge status: Home / Self Care | Payer: BC Managed Care – PPO | Source: Ambulatory Visit | Attending: Family Medicine | Admitting: Family Medicine

## 2010-12-12 DIAGNOSIS — R1031 Right lower quadrant pain: Secondary | ICD-10-CM | POA: Insufficient documentation

## 2010-12-12 DIAGNOSIS — R9389 Abnormal findings on diagnostic imaging of other specified body structures: Secondary | ICD-10-CM | POA: Insufficient documentation

## 2010-12-12 DIAGNOSIS — R109 Unspecified abdominal pain: Secondary | ICD-10-CM

## 2010-12-12 DIAGNOSIS — N200 Calculus of kidney: Secondary | ICD-10-CM | POA: Insufficient documentation

## 2010-12-19 ENCOUNTER — Other Ambulatory Visit (HOSPITAL_COMMUNITY): Payer: Self-pay | Admitting: Urology

## 2010-12-19 ENCOUNTER — Ambulatory Visit (HOSPITAL_COMMUNITY)
Admission: RE | Admit: 2010-12-19 | Discharge: 2010-12-19 | Disposition: A | Discharge status: Home / Self Care | Payer: BC Managed Care – PPO | Source: Ambulatory Visit | Attending: Urology | Admitting: Urology

## 2010-12-19 ENCOUNTER — Other Ambulatory Visit: Payer: Self-pay | Admitting: Family Medicine

## 2010-12-19 DIAGNOSIS — N200 Calculus of kidney: Secondary | ICD-10-CM

## 2010-12-19 DIAGNOSIS — R109 Unspecified abdominal pain: Secondary | ICD-10-CM | POA: Insufficient documentation

## 2010-12-24 ENCOUNTER — Ambulatory Visit (HOSPITAL_COMMUNITY)
Admission: RE | Admit: 2010-12-24 | Discharge: 2010-12-24 | Disposition: A | Discharge status: Home / Self Care | Payer: BC Managed Care – PPO | Source: Ambulatory Visit | Attending: Urology | Admitting: Urology

## 2010-12-24 ENCOUNTER — Other Ambulatory Visit (HOSPITAL_COMMUNITY): Payer: Self-pay | Admitting: Urology

## 2010-12-24 DIAGNOSIS — R109 Unspecified abdominal pain: Secondary | ICD-10-CM

## 2011-05-19 ENCOUNTER — Emergency Department (HOSPITAL_COMMUNITY): Payer: BC Managed Care – PPO

## 2011-05-19 ENCOUNTER — Emergency Department (HOSPITAL_COMMUNITY)
Admission: EM | Admit: 2011-05-19 | Discharge: 2011-05-19 | Disposition: A | Discharge status: Home / Self Care | Payer: BC Managed Care – PPO | Attending: Emergency Medicine | Admitting: Emergency Medicine

## 2011-05-19 ENCOUNTER — Encounter (HOSPITAL_COMMUNITY): Payer: Self-pay

## 2011-05-19 DIAGNOSIS — J45909 Unspecified asthma, uncomplicated: Secondary | ICD-10-CM | POA: Insufficient documentation

## 2011-05-19 DIAGNOSIS — K219 Gastro-esophageal reflux disease without esophagitis: Secondary | ICD-10-CM | POA: Insufficient documentation

## 2011-05-19 DIAGNOSIS — Z79899 Other long term (current) drug therapy: Secondary | ICD-10-CM | POA: Insufficient documentation

## 2011-05-19 DIAGNOSIS — G8929 Other chronic pain: Secondary | ICD-10-CM | POA: Insufficient documentation

## 2011-05-19 DIAGNOSIS — R319 Hematuria, unspecified: Secondary | ICD-10-CM | POA: Insufficient documentation

## 2011-05-19 DIAGNOSIS — N201 Calculus of ureter: Secondary | ICD-10-CM | POA: Insufficient documentation

## 2011-05-19 DIAGNOSIS — N1330 Unspecified hydronephrosis: Secondary | ICD-10-CM | POA: Insufficient documentation

## 2011-05-19 DIAGNOSIS — R109 Unspecified abdominal pain: Secondary | ICD-10-CM | POA: Insufficient documentation

## 2011-05-19 DIAGNOSIS — N200 Calculus of kidney: Secondary | ICD-10-CM | POA: Insufficient documentation

## 2011-05-19 DIAGNOSIS — M549 Dorsalgia, unspecified: Secondary | ICD-10-CM | POA: Insufficient documentation

## 2011-05-19 LAB — URINALYSIS, ROUTINE W REFLEX MICROSCOPIC
Specific Gravity, Urine: >1.03 — ABNORMAL HIGH (ref 1.005–1.030)
Urobilinogen, UA: 1 mg/dL (ref 0.0–1.0)
pH: 6.5 (ref 5.0–8.0)

## 2011-05-19 LAB — URINE MICROSCOPIC-ADD ON

## 2011-05-19 LAB — COMPREHENSIVE METABOLIC PANEL
ALT: 26 U/L (ref 0–35)
AST: 21 U/L (ref 0–37)
Alkaline Phosphatase: 116 U/L (ref 39–117)
CO2: 24 mEq/L (ref 19–32)
Chloride: 101 mEq/L (ref 96–112)
GFR calc non Af Amer: 88 mL/min — ABNORMAL LOW (ref >90)
Potassium: 4.4 mEq/L (ref 3.5–5.1)
Sodium: 138 mEq/L (ref 135–145)
Total Bilirubin: 0.3 mg/dL (ref 0.3–1.2)

## 2011-05-19 LAB — DIFFERENTIAL
Basophils Absolute: 0.1 10*3/uL (ref 0.0–0.1)
Lymphocytes Relative: 12 % (ref 12–46)
Monocytes Absolute: 0.6 10*3/uL (ref 0.1–1.0)
Neutro Abs: 10.3 10*3/uL — ABNORMAL HIGH (ref 1.7–7.7)
Neutrophils Relative %: 80 % — ABNORMAL HIGH (ref 43–77)

## 2011-05-19 LAB — CBC
HCT: 45.6 % (ref 36.0–46.0)
Platelets: 297 10*3/uL (ref 150–400)
RDW: 12.5 % (ref 11.5–15.5)
WBC: 12.8 10*3/uL — ABNORMAL HIGH (ref 4.0–10.5)

## 2011-05-19 MED ORDER — ONDANSETRON HCL 4 MG/2ML IJ SOLN
4.0000 mg | Freq: Once | INTRAMUSCULAR | Status: AC
  Administered 2011-05-19: 4 mg via INTRAVENOUS
  Filled 2011-05-19: qty 2

## 2011-05-19 MED ORDER — OXYCODONE-ACETAMINOPHEN 5-325 MG PO TABS: 1.0000 | ORAL_TABLET | Freq: Four times a day (QID) | ORAL | Status: DC | PRN

## 2011-05-19 MED ORDER — HYDROMORPHONE HCL PF 1 MG/ML IJ SOLN
1.0000 mg | Freq: Once | INTRAMUSCULAR | Status: AC
  Administered 2011-05-19: 1 mg via INTRAVENOUS
  Filled 2011-05-19: qty 1

## 2011-05-19 MED ORDER — TAMSULOSIN HCL 0.4 MG PO CAPS: 0.4000 mg | ORAL_CAPSULE | Freq: Every day | ORAL | Status: DC

## 2011-05-19 MED ORDER — SODIUM CHLORIDE 0.9 % IV SOLN
Freq: Once | INTRAVENOUS | Status: AC
  Administered 2011-05-19: 15:00:00 via INTRAVENOUS

## 2011-05-19 MED ORDER — PROMETHAZINE HCL 25 MG PO TABS: 25.0000 mg | ORAL_TABLET | Freq: Four times a day (QID) | ORAL | Status: DC | PRN

## 2011-05-19 MED ORDER — CEPHALEXIN 500 MG PO CAPS: 500.0000 mg | ORAL_CAPSULE | Freq: Four times a day (QID) | ORAL | Status: DC

## 2011-05-19 NOTE — ED Notes (Signed)
Water given to patient at patient request. Patient sitting up in bed drinking with no other needs or complaints voiced at this time.

## 2011-05-19 NOTE — ED Notes (Signed)
Pt presents with rt flank flank pain. Pt has extensive history of kidney stones. Reports pain started this morning, denies fever at this time. Pt states is nausea at this time, denies vomiting. Urine sample obtained. Noted hematuria with mucus. Results pending

## 2011-05-19 NOTE — ED Notes (Signed)
Pt reports right flank pain that started today, also noted blood in urine.   H/o kidney stones.

## 2011-05-19 NOTE — ED Provider Notes (Signed)
History   This chart was scribed for Benny Lennert, MD by Toya Smothers. The patient was seen in room APA12/APA12. Patient's care was started at 1245.  CSN: [id number]  Arrival date & time 05/19/11  1245   First MD Initiated Contact with Patient 05/19/11 1409    Chief Complaint  Patient presents with  . Flank Pain  . Hematuria   Patient is a 21 y.o. female presenting with flank pain. The history is provided by the patient (pt has right flank pain). No language interpreter was used.  Flank Pain This is a new problem. The current episode started 3 to 5 hours ago. The problem occurs constantly. The problem has not changed since onset.Associated symptoms include abdominal pain. Pertinent negatives include no chest pain, no headaches and no shortness of breath. The symptoms are aggravated by nothing. The symptoms are relieved by nothing.    Carrine Flonnie Overman is a 21 y.o. female who presents to the Emergency Department complaining of gradual onset sever flank pain onset 5 hours ago with associate symptom of hematuria. Patient denies dysuria but admits a chronic history of kidney stones, chronic back pain, CHF, hypertension, sickle cell anemia, and diabetes.  Pt list nephrologist as Dr. Berneta Levins  Past Medical History  Diagnosis Date  . Fibromyalgia   . Asthma   . Syringomyelia     thoracic spine  . GERD (gastroesophageal reflux disease)   . Chronic pain     Dr. Murray Hodgkins in [place]  . Renal disorder     kidney stones  . Kidney stone   . Chronic back pain    Past Surgical History  Procedure Date  . Renal lithiasis removed   . Esophagogastroduodenoscopy 01/2010    circumferential distal esophageal erosions, hiatal hernia, excoriations/erosion in gastric body ?trauma from vomiting, bx negative and showed chronic gastritis  . Colonoscopy 01/2010    scattered petechiae and fibrotic-appearing mucosa of uncertain significance , ?resolving infection, bx benign  . Breath hydrogen test 08/08/2010       .  Hydrogen breath test 08/06/2010    Procedure: HYDROGEN BREATH TEST;  Surgeon: Corbin Ade, MD;  Location: [facility];  Service: Gastroenterology;  Laterality: N/A;   Family History  Problem Relation Age of Onset  . Multiple sclerosis Father   . Cervical cancer Mother    History  Substance Use Topics  . Smoking status: Current Everyday Smoker -- 0.5 packs/day for 10 years    Types: Cigarettes  . Smokeless tobacco: Not on file  . Alcohol Use: No     denies   Review of Systems  Constitutional: Negative for fever and chills.  HENT: Negative for rhinorrhea.   Eyes: Negative for pain.  Respiratory: Negative for cough and shortness of breath.   Cardiovascular: Negative for chest pain.  Gastrointestinal: Positive for abdominal pain. Negative for nausea, vomiting and diarrhea.  Genitourinary: Positive for hematuria and flank pain. Negative for dysuria.  Musculoskeletal: Positive for back pain.  Skin: Negative for rash.  Neurological: Negative for weakness and headaches.    Allergies  Sulfonamide derivatives  Home Medications   Current Outpatient Rx  Name Route Sig Dispense Refill  . MEDROXYPROGESTERONE ACETATE 150 MG/ML IM SUSP Intramuscular Inject 150 mg into the muscle every 3 (three) months.      BP 120/80  Pulse 98  Temp(Src) 97.8 F (36.6 C) (Oral)  Resp 20  Ht 5' (1.524 m)  Wt 140 lb (63.504 kg)  BMI 27.34 kg/m2  SpO2 97%  LMP 04/28/2011  Physical Exam  Nursing note and vitals reviewed. Constitutional: She is oriented to person, place, and time. She appears well-developed and well-nourished. No distress.  HENT:  Head: Normocephalic and atraumatic.  Eyes: EOM are normal. Pupils are equal, round, and reactive to light.  Neck: Neck supple. No tracheal deviation present.  Cardiovascular: Normal rate, regular rhythm and normal heart sounds.  Exam reveals no gallop and no friction rub.   No murmur heard. Pulmonary/Chest: Effort normal and breath sounds normal. No  respiratory distress. She has no wheezes. She has no rales.  Abdominal: Soft. She exhibits no distension.       Tenderness in right flank  Musculoskeletal: Normal range of motion. She exhibits no edema.  Neurological: She is alert and oriented to person, place, and time. No cranial nerve deficit or sensory deficit. Coordination normal.  Skin: Skin is warm and dry.  Psychiatric: She has a normal mood and affect. Her behavior is normal.    ED Course  Procedures (including critical care time)  DIAGNOSTIC STUDIES: Oxygen Saturation is 97% on room air, normal by my interpretation.    COORDINATION OF CARE: 2:26pm - Reviewed past medical history and blood work.   Labs Reviewed  URINALYSIS, ROUTINE W REFLEX MICROSCOPIC - Abnormal; Notable for the following:    Color, Urine BROWN (*) BIOCHEMICALS MAY BE AFFECTED BY COLOR   APPearance CLOUDY (*)    Specific Gravity, Urine >1.030 (*)    Hgb urine dipstick LARGE (*)    Bilirubin Urine MODERATE (*)    Ketones, ur TRACE (*)    Protein, ur >300 (*)    Nitrite POSITIVE (*)    Leukocytes, UA SMALL (*)    All other components within normal limits  URINE MICROSCOPIC-ADD ON - Abnormal; Notable for the following:    Squamous Epithelial / LPF FEW (*)    Bacteria, UA MANY (*)    All other components within normal limits  PREGNANCY, URINE  CBC  DIFFERENTIAL  COMPREHENSIVE METABOLIC PANEL   No results found.   No diagnosis found.   MDM  The chart was scribed for me under my direct supervision.  I personally performed the history, physical, and medical decision making and all procedures in the evaluation of this patient.Benny Lennert, MD 05/19/11 [phone]

## 2011-05-19 NOTE — ED Notes (Signed)
Spoke with Nelva Bush in lab and she will add urine culture.

## 2011-05-19 NOTE — Discharge Instructions (Signed)
Plenty of fluids.  Follow up with dr. Jerre Simon this week.

## 2011-05-21 LAB — URINE CULTURE: Colony Count: 55000

## 2011-05-22 MED FILL — Oxycodone w/ Acetaminophen Tab 5-325 MG: ORAL | Qty: 6 | Status: AC

## 2011-05-24 ENCOUNTER — Encounter (HOSPITAL_COMMUNITY)
Admission: RE | Admit: 2011-05-24 | Discharge: 2011-05-24 | Disposition: A | Discharge status: Home / Self Care | Payer: BC Managed Care – PPO | Source: Ambulatory Visit | Attending: Urology | Admitting: Urology

## 2011-05-24 ENCOUNTER — Encounter (HOSPITAL_COMMUNITY): Payer: Self-pay

## 2011-05-24 LAB — SURGICAL PCR SCREEN
MRSA, PCR: NEGATIVE
Staphylococcus aureus: NEGATIVE

## 2011-05-24 NOTE — Patient Instructions (Addendum)
18 [name] [name]  05/24/2011   Your procedure is scheduled on:  05/28/2011  Report to [facility] at  700  AM.  Call this number if you have problems the morning of surgery: [phone]   Remember:   Do not eat food:After Midnight.  May have clear liquids:until Midnight .    Take these medicines the morning of surgery with A SIP OF WATER:  Oxycodone,phenergan, flomax   Do not wear jewelry, make-up or nail polish.  Do not wear lotions, powders, or perfumes. You may wear deodorant.  Do not shave 48 hours prior to surgery.  Do not bring valuables to the hospital.  Contacts, dentures or bridgework may not be worn into surgery.  Leave suitcase in the car. After surgery it may be brought to your room.  For patients admitted to the hospital, checkout time is 11:00 AM the day of discharge.   Patients discharged the day of surgery will not be allowed to drive home.  Name and phone number of your driver: family  Special Instructions: CHG Shower Use Special Wash: 1/2 bottle night before surgery and 1/2 bottle morning of surgery.   Please read over the following fact sheets that you were given: Pain Booklet, MRSA Information, Surgical Site Infection Prevention, Anesthesia Post-op Instructions and Care and Recovery After Surgery Cystoscopy (Bladder Exam) A cystoscopy is an examination of your urinary bladder with a cystoscope. A cystoscope is an instrument like a small telescope with strong lights and lenses. It is inserted into the bladder through the urethra (the opening into the bladder) and allows your caregiver to examine the inside of your bladder. The procedure causes little discomfort and can be done in a hospital or office. It is a diagnostic procedure to evaluate the inside of your bladder. It may involve x-rays to further evaluate the ureters or internal aspects of the kidneys. It may aid in the removal of urinary stones  or in taking tissue samples (biopsies) if necessary. The procedure is easier  in females because of a shorter urethra. In a female, the procedure must be done through the penis. This often requires more sedation and more time to do the procedure. The procedure usually takes twenty minutes to one half hour for a female and approximately an hour for a female. LET YOUR CAREGIVERS KNOW ABOUT:  Allergies.   Medications taken including herbs, eye drops, over the counter medications, and creams.   Use of steroids (by mouth or creams).   Previous problems with anesthetics or novocaine.   Possibility of pregnancy, if this applies.   History of blood clots (thrombophlebitis).   History of bleeding or blood problems.   Previous surgery, especially where prosthetics have been used like hip or knee replacements, and heart valve replacements.   Other health problems.  BEFORE THE PROCEDURE  You should be present 60 minutes prior to your procedure or as directed.  PROCEDURE During the procedure, you will:  Be assisted by your urologist and a nurse.   Lie on a cystoscopy table with your knees elevated and legs apart and covered with a drape. For women this is the same position as when a pap smear is taken.   Have the urethral area or penis washed and covered with sterile towels.   Have an anesthetic (numbing) jelly applied to the urethra. This is usually all that is required for females but males may also require sedation.   Have the cystoscope inserted through the urethra and into the bladder.  Sterile fluid will flow through the cystoscope and into the bladder. This will expand the bladder and provide clear fluid for the urologist to look through and examine the interior of the bladder.   Be allowed to go home once you are doing well, are stable, and awake if you were given a sedative. If given a sedative, have someone give you a ride home.  AFTER THE PROCEDURE  You may have temporary bleeding and burning on urination.   Drink lots of fluids.  SEEK IMMEDIATE MEDICAL CARE  IF:  There is an increase in blood in the urine or if you are passing clots.   You have difficulty in passing your urine   You develop chills and/or an unexplained oral temperature above 102 F (38.9 C).  Your caregiver will discuss your results with you following the procedure. This may be at a later time if you have been sedated. If other testing or biopsies were taken, ask your caregiver how you are to obtain the results. Remember it is your responsibility to get your results. Do not assume everything is normal if you do not hear from your caregiver. Document Released: 12/29/1999 Document Revised: 12/20/2010 Document Reviewed: 10/22/2007 [facility] Patient Information 2012 [place], [place].PATIENT INSTRUCTIONS POST-ANESTHESIA  IMMEDIATELY FOLLOWING SURGERY:  Do not drive or operate machinery for the first twenty four hours after surgery.  Do not make any important decisions for twenty four hours after surgery or while taking narcotic pain medications or sedatives.  If you develop intractable nausea and vomiting or a severe headache please notify your doctor immediately.  FOLLOW-UP:  Please make an appointment with your surgeon as instructed. You do not need to follow up with anesthesia unless specifically instructed to do so.  WOUND CARE INSTRUCTIONS (if applicable):  Keep a dry clean dressing on the anesthesia/puncture wound site if there is drainage.  Once the wound has quit draining you may leave it open to air.  Generally you should leave the bandage intact for twenty four hours unless there is drainage.  If the epidural site drains for more than 36-48 hours please call the anesthesia department.  QUESTIONS?:  Please feel free to call your physician or the hospital operator if you have any questions, and they will be happy to assist you.     [facility] Anesthesia Department [address] [place] [phone]

## 2011-05-28 ENCOUNTER — Ambulatory Visit (HOSPITAL_COMMUNITY)
Admission: RE | Admit: 2011-05-28 | Discharge: 2011-05-28 | Disposition: A | Discharge status: Home / Self Care | Payer: BC Managed Care – PPO | Source: Ambulatory Visit | Attending: Urology | Admitting: Urology

## 2011-05-28 ENCOUNTER — Encounter (HOSPITAL_COMMUNITY): Payer: Self-pay | Admitting: Anesthesiology

## 2011-05-28 ENCOUNTER — Encounter (HOSPITAL_COMMUNITY): Payer: Self-pay | Admitting: *Deleted

## 2011-05-28 ENCOUNTER — Ambulatory Visit (HOSPITAL_COMMUNITY): Payer: BC Managed Care – PPO | Admitting: Anesthesiology

## 2011-05-28 ENCOUNTER — Ambulatory Visit (HOSPITAL_COMMUNITY): Payer: BC Managed Care – PPO

## 2011-05-28 ENCOUNTER — Encounter (HOSPITAL_COMMUNITY)
Admission: RE | Disposition: A | Discharge status: Home / Self Care | Payer: Self-pay | Source: Ambulatory Visit | Attending: Urology

## 2011-05-28 DIAGNOSIS — N201 Calculus of ureter: Secondary | ICD-10-CM | POA: Insufficient documentation

## 2011-05-28 HISTORY — PX: CYSTOSCOPY WITH URETHRAL DILATATION: SHX5125

## 2011-05-28 HISTORY — PX: STONE EXTRACTION WITH BASKET: SHX5318

## 2011-05-28 HISTORY — PX: CYSTOSCOPY W/ URETERAL STENT PLACEMENT: SHX1429

## 2011-05-28 SURGERY — CYSTOSCOPY, WITH RETROGRADE PYELOGRAM AND URETERAL STENT INSERTION
Anesthesia: General | Laterality: Right | Wound class: Clean Contaminated

## 2011-05-28 MED ORDER — NEOSTIGMINE METHYLSULFATE 1 MG/ML IJ SOLN
INTRAMUSCULAR | Status: AC
  Filled 2011-05-28: qty 10

## 2011-05-28 MED ORDER — LACTATED RINGERS IV SOLN
INTRAVENOUS | Status: DC
  Administered 2011-05-28: 1000 mL via INTRAVENOUS

## 2011-05-28 MED ORDER — MIDAZOLAM HCL 5 MG/5ML IJ SOLN
INTRAMUSCULAR | Status: DC | PRN
  Administered 2011-05-28: 2 mg via INTRAVENOUS

## 2011-05-28 MED ORDER — STERILE WATER FOR IRRIGATION IR SOLN
Status: DC | PRN
  Administered 2011-05-28: 1000 mL

## 2011-05-28 MED ORDER — ROCURONIUM BROMIDE 50 MG/5ML IV SOLN
INTRAVENOUS | Status: AC
  Filled 2011-05-28: qty 1

## 2011-05-28 MED ORDER — FENTANYL CITRATE 0.05 MG/ML IJ SOLN
INTRAMUSCULAR | Status: AC
  Administered 2011-05-28: 25 ug via INTRAVENOUS
  Filled 2011-05-28: qty 2

## 2011-05-28 MED ORDER — ONDANSETRON HCL 4 MG/2ML IJ SOLN
4.0000 mg | Freq: Once | INTRAMUSCULAR | Status: AC
  Administered 2011-05-28: 4 mg via INTRAVENOUS

## 2011-05-28 MED ORDER — MIDAZOLAM HCL 2 MG/2ML IJ SOLN
INTRAMUSCULAR | Status: AC
  Administered 2011-05-28: 2 mg via INTRAVENOUS
  Filled 2011-05-28: qty 2

## 2011-05-28 MED ORDER — GLYCOPYRROLATE 0.2 MG/ML IJ SOLN
INTRAMUSCULAR | Status: AC
  Filled 2011-05-28: qty 1

## 2011-05-28 MED ORDER — FENTANYL CITRATE 0.05 MG/ML IJ SOLN
25.0000 ug | INTRAMUSCULAR | Status: DC | PRN
  Administered 2011-05-28 (×2): 50 ug via INTRAVENOUS
  Administered 2011-05-28: 25 ug via INTRAVENOUS
  Administered 2011-05-28: 50 ug via INTRAVENOUS
  Administered 2011-05-28: 25 ug via INTRAVENOUS

## 2011-05-28 MED ORDER — FENTANYL CITRATE 0.05 MG/ML IJ SOLN
INTRAMUSCULAR | Status: AC
  Administered 2011-05-28: 50 ug via INTRAVENOUS
  Filled 2011-05-28: qty 2

## 2011-05-28 MED ORDER — PROPOFOL 10 MG/ML IV EMUL
INTRAVENOUS | Status: DC | PRN
  Administered 2011-05-28: 30 mg via INTRAVENOUS
  Administered 2011-05-28: 150 mg via INTRAVENOUS
  Administered 2011-05-28: 20 mg via INTRAVENOUS

## 2011-05-28 MED ORDER — FENTANYL CITRATE 0.05 MG/ML IJ SOLN
INTRAMUSCULAR | Status: DC | PRN
  Administered 2011-05-28 (×2): 25 ug via INTRAVENOUS
  Administered 2011-05-28: 50 ug via INTRAVENOUS
  Administered 2011-05-28: 25 ug via INTRAVENOUS
  Administered 2011-05-28: 50 ug via INTRAVENOUS
  Administered 2011-05-28: 25 ug via INTRAVENOUS

## 2011-05-28 MED ORDER — ONDANSETRON HCL 4 MG/2ML IJ SOLN: 4.0000 mg | Freq: Once | INTRAMUSCULAR | Status: DC | PRN

## 2011-05-28 MED ORDER — NEOSTIGMINE METHYLSULFATE 1 MG/ML IJ SOLN
INTRAMUSCULAR | Status: DC | PRN
  Administered 2011-05-28: 2 mg via INTRAVENOUS

## 2011-05-28 MED ORDER — PROPOFOL 10 MG/ML IV EMUL
INTRAVENOUS | Status: AC
  Filled 2011-05-28: qty 20

## 2011-05-28 MED ORDER — OXYCODONE-ACETAMINOPHEN 7.5-325 MG PO TABS: 1.0000 | ORAL_TABLET | Freq: Four times a day (QID) | ORAL | Status: AC | PRN

## 2011-05-28 MED ORDER — ONDANSETRON HCL 4 MG/2ML IJ SOLN
INTRAMUSCULAR | Status: AC
  Administered 2011-05-28: 4 mg via INTRAVENOUS
  Filled 2011-05-28: qty 2

## 2011-05-28 MED ORDER — MIDAZOLAM HCL 2 MG/2ML IJ SOLN
1.0000 mg | INTRAMUSCULAR | Status: DC | PRN
  Administered 2011-05-28: 2 mg via INTRAVENOUS

## 2011-05-28 MED ORDER — SODIUM CHLORIDE 0.9 % IR SOLN
Status: DC | PRN
  Administered 2011-05-28: 3000 mL

## 2011-05-28 MED ORDER — IOHEXOL 350 MG/ML SOLN
INTRAVENOUS | Status: DC | PRN
  Administered 2011-05-28: 50 mL

## 2011-05-28 MED ORDER — ROCURONIUM BROMIDE 100 MG/10ML IV SOLN
INTRAVENOUS | Status: DC | PRN
  Administered 2011-05-28: 5 mg via INTRAVENOUS
  Administered 2011-05-28: 30 mg via INTRAVENOUS

## 2011-05-28 MED ORDER — GLYCOPYRROLATE 0.2 MG/ML IJ SOLN
INTRAMUSCULAR | Status: DC | PRN
  Administered 2011-05-28: 0.4 mg via INTRAVENOUS

## 2011-05-28 MED ORDER — MIDAZOLAM HCL 2 MG/2ML IJ SOLN
INTRAMUSCULAR | Status: AC
  Filled 2011-05-28: qty 2

## 2011-05-28 SURGICAL SUPPLY — 25 items
BAG DRAIN URO TABLE W/ADPT NS (DRAPE) ×3 IMPLANT
BAG DRN 8 ADPR NS SKTRN CSTL (DRAPE) ×2
BASKET LASER NITINOL 1.9FR (BASKET) ×1 IMPLANT
BSKT STON RTRVL 120 1.9FR (BASKET) ×2
CATH 5 FR WEDGE TIP (UROLOGICAL SUPPLIES) ×3 IMPLANT
CATH OPEN TIP 5FR (CATHETERS) ×3 IMPLANT
CLOTH BEACON ORANGE TIMEOUT ST (SAFETY) ×3 IMPLANT
DILATOR UROMAX ULTRA (MISCELLANEOUS) ×1 IMPLANT
GLIDEWIRE 3CM TIP (WIRE) ×1 IMPLANT
GLOVE BIO SURGEON STRL SZ7 (GLOVE) ×3 IMPLANT
GLOVE ECLIPSE 6.5 STRL STRAW (GLOVE) ×1 IMPLANT
GLOVE EXAM NITRILE MD LF STRL (GLOVE) ×1 IMPLANT
GLOVE INDICATOR 7.0 STRL GRN (GLOVE) ×1 IMPLANT
GOWN STRL REIN XL XLG (GOWN DISPOSABLE) ×3 IMPLANT
IV NS IRRIG 3000ML ARTHROMATIC (IV SOLUTION) ×6 IMPLANT
KIT ROOM TURNOVER AP CYSTO (KITS) ×3 IMPLANT
LASER FIBER DISP (UROLOGICAL SUPPLIES) IMPLANT
LASER FIBER DISP 1000U (UROLOGICAL SUPPLIES) IMPLANT
MANIFOLD NEPTUNE II (INSTRUMENTS) ×3 IMPLANT
PACK CYSTO (CUSTOM PROCEDURE TRAY) ×3 IMPLANT
PAD ARMBOARD 7.5X6 YLW CONV (MISCELLANEOUS) ×3 IMPLANT
STENT PERCUFLEX 4.8FRX24 (STENTS) ×1 IMPLANT
STONE RETRIEVAL GEMINI 2.4 FR (MISCELLANEOUS) IMPLANT
TOWEL OR 17X26 4PK STRL BLUE (TOWEL DISPOSABLE) ×3 IMPLANT
WIRE GUIDE BENTSON .035 15CM (WIRE) ×3 IMPLANT

## 2011-05-28 NOTE — Anesthesia Postprocedure Evaluation (Signed)
Anesthesia Post Note  Patient: [name] [name]  Procedure(s) Performed: Procedure(s) (LRB): CYSTOSCOPY WITH RETROGRADE PYELOGRAM/URETERAL STENT PLACEMENT (Right) STONE EXTRACTION WITH BASKET (Right) HOLMIUM LASER APPLICATION (Right) URETERAL DILITATION (Right) CYSTOSCOPY WITH URETHRAL DILATATION (N/A)  Anesthesia type: General  Patient location: PACU  Post pain: Pain level controlled  Post assessment: Post-op Vital signs reviewed, Patient's Cardiovascular Status Stable, Respiratory Function Stable, Patent Airway, No signs of Nausea or vomiting and Pain level controlled  Last Vitals:  Filed Vitals:   05/28/11 1100  BP: 126/65  Pulse: 84  Temp:   Resp: 16    Post vital signs: Reviewed and stable  Level of consciousness: awake and alert   Complications: No apparent anesthesia complications

## 2011-05-28 NOTE — Transfer of Care (Signed)
Immediate Anesthesia Transfer of Care Note  Patient: [name] [name]  Procedure(s) Performed: Procedure(s) (LRB): CYSTOSCOPY WITH RETROGRADE PYELOGRAM/URETERAL STENT PLACEMENT (Right) STONE EXTRACTION WITH BASKET (Right) HOLMIUM LASER APPLICATION (Right) URETERAL DILITATION (Right) CYSTOSCOPY WITH URETHRAL DILATATION (N/A)  Patient Location: PACU  Anesthesia Type: General  Level of Consciousness: awake  Airway & Oxygen Therapy: Patient Spontanous Breathing and non-rebreather face mask  Post-op Assessment: Report given to PACU RN, Post -op Vital signs reviewed and stable and Patient moving all extremities  Post vital signs: Reviewed and stable  Complications: No apparent anesthesia complications

## 2011-05-28 NOTE — Anesthesia Postprocedure Evaluation (Signed)
Anesthesia Post Note  Patient: [name] [name]  Procedure(s) Performed: Procedure(s) (LRB): CYSTOSCOPY WITH RETROGRADE PYELOGRAM/URETERAL STENT PLACEMENT (Right) STONE EXTRACTION WITH BASKET (Right) HOLMIUM LASER APPLICATION (Right) URETERAL DILITATION (Right) CYSTOSCOPY WITH URETHRAL DILATATION (N/A)  Anesthesia type: General  Patient location: PACU  Post pain: Pain level controlled  Post assessment: Post-op Vital signs reviewed, Patient's Cardiovascular Status Stable, Respiratory Function Stable, Patent Airway, No signs of Nausea or vomiting and Pain level controlled  Last Vitals:  Filed Vitals:   05/28/11 1010  BP: 114/58  Pulse: 97  Temp: 36.4 C  Resp: 12    Post vital signs: Reviewed and stable  Level of consciousness: awake and alert   Complications: No apparent anesthesia complications

## 2011-05-28 NOTE — Progress Notes (Signed)
No change in H&P on reexamination. 

## 2011-05-28 NOTE — Addendum Note (Signed)
Addendum  created 05/28/11 1116 by Franco Nones, CRNA   Modules edited:Notes Section

## 2011-05-28 NOTE — Brief Op Note (Signed)
05/28/2011  10:05 AM  PATIENT:  [name] [name]  21 y.o. female  PRE-OPERATIVE DIAGNOSIS:  right distal ureteral calculus  POST-OPERATIVE DIAGNOSIS:  right distal ureteral calculus  PROCEDURE:  Procedure(s) (LRB): CYSTOSCOPY WITH RETROGRADE PYELOGRAM/URETERAL STENT PLACEMENT (Right) STONE EXTRACTION WITH BASKET (Right) HOLMIUM LASER APPLICATION (Right) URETERAL DILITATION (Right) CYSTOSCOPY WITH URETHRAL DILATATION (N/A)  SURGEON:  Surgeon(s) and Role:    * Ky Barban, MD - Primary  PHYSICIAN ASSISTANT:   ASSISTANTS: none   ANESTHESIA:   general  EBL:  Total I/O In: 600 [I.V.:600] Out: 0   BLOOD ADMINISTERED:none  DRAINS: none   LOCAL MEDICATIONS USED:  NONE  SPECIMEN:  Source of Specimen:  ureteral calculus given to the pt to bring to office to be sent for analysis.  DISPOSITION OF SPECIMEN:  N/A  COUNTS:  YES  TOURNIQUET:  * No tourniquets in log *  DICTATION: .Other Dictation: Dictation Number dictation [id number]  PLAN OF CARE: Discharge to home after PACU  PATIENT DISPOSITION:  PACU - hemodynamically stable.   Delay start of Pharmacological VTE agent (>24hrs) due to surgical blood loss or risk of bleeding:

## 2011-05-28 NOTE — Anesthesia Procedure Notes (Signed)
Procedure Name: Intubation Date/Time: 05/28/2011 9:13 AM Performed by: Franco Nones Pre-anesthesia Checklist: Patient identified, Patient being monitored, Timeout performed, Emergency Drugs available and Suction available Patient Re-evaluated:Patient Re-evaluated prior to inductionOxygen Delivery Method: Circle System Utilized Preoxygenation: Pre-oxygenation with 100% oxygen Intubation Type: IV induction, Rapid sequence and Cricoid Pressure applied Laryngoscope Size: Miller and 2 Grade View: Grade I Tube type: Oral Tube size: 7.0 mm Number of attempts: 1 Airway Equipment and Method: stylet Placement Confirmation: ETT inserted through vocal cords under direct vision,  positive ETCO2 and breath sounds checked- equal and bilateral Secured at: 21 cm Tube secured with: Tape Dental Injury: Teeth and Oropharynx as per pre-operative assessment

## 2011-05-28 NOTE — H&P (Signed)
NAME:  [name] Analena                       ACCOUNT NO.:  MEDICAL RECORD NO.:  [id number]  LOCATION:                                 FACILITY:  PHYSICIAN:  Ky Barban, M.D.DATE OF BIRTH:  April 24, 1990  DATE OF ADMISSION: DATE OF DISCHARGE:  LH                             HISTORY & PHYSICAL   CHIEF COMPLAINT:  Recurrent right renal colic.  HISTORY:  This is a young female who is 21 years old has recurrent episode of right renal colic for the last 3 days when she was seen in the office on May 21, 2011.  She went to the emergency room on Sunday before CT scan was done.  CT shows bilateral renal calculi, moderate right hydroureteronephrosis down to the 4-mm calculus located just above the right acetabulum, no bladder calculi, no obstructing left ureteral calculi seen.  I told the patient to wait and see if she can pass the stone and gave her Flomax and pain medicine.  She continued to have pain, has not passed the stone, so she is coming as outpatient to undergo cystoscopy, right retrograde pyelogram, ureteroscopic stone basket extraction, holmium laser lithotripsy with use of double-J stent. I discussed the procedure limitations, complications especially ureteral perforation leading to open surgery, stone migration and use of double-J stent.  She understands and want me to go ahead and proceed with it.  In January 27, 2011, she passed the stone from the right kidney.  Few years ago, she had similar problem, went to basket the stone, but she already had passed the stone.  She had colonoscopy done about a year ago, which was normal.  Only medicine she takes Depo shots.  MEDICINES:  She is taking antibiotics, Flomax and Phenergan.  PERSONAL HISTORY:  She smokes half pack a day for the last 10 years. Does not smoke or drink alcohol.  OTHER MEDICAL PROBLEMS:  Fibromyalgia, bronchial asthma, GERD, chronic lower backache.  REVIEW OF SYSTEMS:  Otherwise unremarkable.  PHYSICAL  EXAMINATION:  GENERAL:  Well-nourished and well-developed female, moderately obese, not in acute distress. VITAL SIGNS:  Blood pressure 130/80, temperature is normal. CENTRAL Nervous System:  No gross neurological deficit. HEAD, NECK, EYE, ENT:  Negative. CHEST:  Symmetrical. HEART:  Regular sinus rhythm.  No murmur. ABDOMEN:  Soft, flat.  Liver, spleen, kidneys are not palpable.  No CVA tenderness. PELVIC:  No adnexal mass or tenderness.  IMPRESSION:  Distal right ureteral calculus.  PLAN:  Cystoscopy, right retrograde pyelogram, ureteroscopic stone basket extraction, holmium laser lithotripsy, insertion of double-J stent under anesthesia as outpatient.     Ky Barban, M.D.     MIJ/MEDQ  D:  05/27/2011  T:  05/28/2011  Job:  [id number]  cc:   Lorin Picket A. Gerda Diss, MD Fax: [phone]

## 2011-05-28 NOTE — Anesthesia Preprocedure Evaluation (Addendum)
Anesthesia Evaluation  Patient identified by MRN, date of birth, ID band Patient awake    Reviewed: Allergy & Precautions, H&P , NPO status , Patient's Chart, lab work & pertinent test results  Airway Mallampati: II TM Distance: >3 FB Neck ROM: Full    Dental  (+) Teeth Intact   Pulmonary asthma , Current Smoker,  breath sounds clear to auscultation        Cardiovascular negative cardio ROS  Rhythm:Regular     Neuro/Psych PSYCHIATRIC DISORDERS (chronic pain syndrome)  Neuromuscular disease    GI/Hepatic GERD-  Medicated,  Endo/Other    Renal/GU Renal disease (ureteral stone)     Musculoskeletal  (+) Fibromyalgia -  Abdominal   Peds  Hematology   Anesthesia Other Findings   Reproductive/Obstetrics                           Anesthesia Physical Anesthesia Plan  ASA: II  Anesthesia Plan: General   Post-op Pain Management:    Induction: Intravenous, Rapid sequence and Cricoid pressure planned  Airway Management Planned: Oral ETT  Additional Equipment:   Intra-op Plan:   Post-operative Plan: Extubation in OR  Informed Consent: I have reviewed the patients History and Physical, chart, labs and discussed the procedure including the risks, benefits and alternatives for the proposed anesthesia with the patient or authorized representative who has indicated his/her understanding and acceptance.     Plan Discussed with:   Anesthesia Plan Comments:         Anesthesia Quick Evaluation  

## 2011-05-28 NOTE — Op Note (Signed)
[name] [name], [name] [name]                  ACCOUNT NO.:  [id number]  MEDICAL RECORD NO.:  [id number]  LOCATION:  [facility]                          FACILITY:  [facility]  PHYSICIAN:  Ky Barban, M.D.DATE OF BIRTH:  01-10-1991  DATE OF PROCEDURE: DATE OF DISCHARGE:  05/28/2011                              OPERATIVE REPORT   PREOPERATIVE DIAGNOSIS:  Right ureteral calculus.  POSTOPERATIVE DIAGNOSIS:  Right ureteral calculus.  PROCEDURE:  Cystoscopy, right retrograde pyelogram, ureteroscopic stone basket extraction, holmium laser lithotripsy, insertion of double-J stent, size 5-French 24 cm, no string attached.  ANESTHESIA:  General.  DESCRIPTION OF PROCEDURE:  The patient under general anesthesia in lithotomy position, usual prep and drape. Urethra was dilated to 24- [place] to get the 24 cystoscope in and the right ureteral orifice was identified, catheterized with a wedge catheter.  Hypaque injected under fluoroscopic control.  The dye goes up into the upper ureter.  The stone is not very clearly seen but there appears to be dilated ureter down to the distal third of the ureter.  At this point, a guidewire is passed up into the renal pelvis and over the guidewire, 15 balloon dilator was introduced into the intramural ureter which was dilated.  Once the intramural ureter was dilated, I removed the balloon and the short rigid ureteroscope was introduced alongside the guidewire, went up into the distal third of the ureter and the stone was visualized.  It was then under direct vision engaged in the basket, held in place.  Then using 90 micron laser fiber, holmium laser was used to break the stone.  The piece of the stone was then removed without any difficulty. Ureteroscope was reintroduced into the ureter, is inspected, looks like the ureter is intact.  No residual stones.  All the instruments were removed.  Then a 5-French 24 cm double-J stent was introduced over the guidewire and under  fluoroscopic control, it was positioned between the renal pelvis and the bladder and the guidewire was removed.  Nice loop was obtained in the renal pelvis and the bladder.  All the instruments were removed.  The patient left the operating room in satisfactory condition.     Ky Barban, M.D.     MIJ/MEDQ  D:  05/28/2011  T:  05/28/2011  Job:  [id number]

## 2011-05-29 ENCOUNTER — Emergency Department (HOSPITAL_COMMUNITY): Payer: BC Managed Care – PPO

## 2011-05-29 ENCOUNTER — Emergency Department (HOSPITAL_COMMUNITY)
Admission: EM | Admit: 2011-05-29 | Discharge: 2011-05-29 | Disposition: A | Discharge status: Home / Self Care | Payer: BC Managed Care – PPO | Attending: Emergency Medicine | Admitting: Emergency Medicine

## 2011-05-29 ENCOUNTER — Encounter (HOSPITAL_COMMUNITY): Payer: Self-pay | Admitting: *Deleted

## 2011-05-29 DIAGNOSIS — F17200 Nicotine dependence, unspecified, uncomplicated: Secondary | ICD-10-CM | POA: Insufficient documentation

## 2011-05-29 DIAGNOSIS — Z87442 Personal history of urinary calculi: Secondary | ICD-10-CM | POA: Insufficient documentation

## 2011-05-29 DIAGNOSIS — M549 Dorsalgia, unspecified: Secondary | ICD-10-CM | POA: Insufficient documentation

## 2011-05-29 DIAGNOSIS — N390 Urinary tract infection, site not specified: Secondary | ICD-10-CM

## 2011-05-29 DIAGNOSIS — IMO0001 Reserved for inherently not codable concepts without codable children: Secondary | ICD-10-CM | POA: Insufficient documentation

## 2011-05-29 DIAGNOSIS — G8929 Other chronic pain: Secondary | ICD-10-CM | POA: Insufficient documentation

## 2011-05-29 DIAGNOSIS — N200 Calculus of kidney: Secondary | ICD-10-CM | POA: Insufficient documentation

## 2011-05-29 LAB — URINE MICROSCOPIC-ADD ON

## 2011-05-29 LAB — URINALYSIS, ROUTINE W REFLEX MICROSCOPIC
Nitrite: POSITIVE — AB
Specific Gravity, Urine: >1.03 — ABNORMAL HIGH (ref 1.005–1.030)
Urobilinogen, UA: 0.2 mg/dL (ref 0.0–1.0)

## 2011-05-29 LAB — DIFFERENTIAL
Basophils Absolute: 0 10*3/uL (ref 0.0–0.1)
Lymphocytes Relative: 4 % — ABNORMAL LOW (ref 12–46)
Neutro Abs: 15.3 10*3/uL — ABNORMAL HIGH (ref 1.7–7.7)

## 2011-05-29 LAB — BASIC METABOLIC PANEL
CO2: 23 mEq/L (ref 19–32)
Calcium: 9.3 mg/dL (ref 8.4–10.5)
Chloride: 103 mEq/L (ref 96–112)
Sodium: 138 mEq/L (ref 135–145)

## 2011-05-29 LAB — CBC
Platelets: 221 10*3/uL (ref 150–400)
RDW: 12.5 % (ref 11.5–15.5)
WBC: 16.9 10*3/uL — ABNORMAL HIGH (ref 4.0–10.5)

## 2011-05-29 LAB — PREGNANCY, URINE: Preg Test, Ur: NEGATIVE

## 2011-05-29 MED ORDER — SODIUM CHLORIDE 0.9 % IV BOLUS (SEPSIS)
1000.0000 mL | Freq: Once | INTRAVENOUS | Status: AC
  Administered 2011-05-29: 1000 mL via INTRAVENOUS

## 2011-05-29 MED ORDER — HYDROMORPHONE HCL PF 1 MG/ML IJ SOLN
1.0000 mg | Freq: Once | INTRAMUSCULAR | Status: AC
  Administered 2011-05-29: 1 mg via INTRAVENOUS
  Filled 2011-05-29: qty 1

## 2011-05-29 MED ORDER — ONDANSETRON HCL 8 MG PO TABS: 8.0000 mg | ORAL_TABLET | ORAL | Status: AC | PRN

## 2011-05-29 MED ORDER — ONDANSETRON HCL 4 MG/2ML IJ SOLN
4.0000 mg | Freq: Once | INTRAMUSCULAR | Status: AC
  Administered 2011-05-29: 4 mg via INTRAVENOUS
  Filled 2011-05-29: qty 2

## 2011-05-29 MED ORDER — IBUPROFEN 800 MG PO TABS: 800.0000 mg | ORAL_TABLET | Freq: Three times a day (TID) | ORAL | Status: AC

## 2011-05-29 MED ORDER — CIPROFLOXACIN HCL 500 MG PO TABS: 500.0000 mg | ORAL_TABLET | Freq: Two times a day (BID) | ORAL | Status: AC

## 2011-05-29 MED ORDER — DEXTROSE 5 % IV SOLN
1.0000 g | Freq: Once | INTRAVENOUS | Status: AC
  Administered 2011-05-29: 1 g via INTRAVENOUS
  Filled 2011-05-29: qty 10

## 2011-05-29 NOTE — ED Notes (Signed)
Pt c/o lower abdominal pain, painful urination, nausea and vomiting since this am. Pt states that she had surgery yesterday to remove a kidney stone.

## 2011-05-29 NOTE — ED Notes (Signed)
Pt presents with lower abdominal pain that began this morning. Pt states has"  not been able to keep anything down all day". Pt reports having a kidney stone surgically removed yesterday. Pt states took phenergan and percocet this afternoon but was able to keep it down.  Pt Denies fever and hematuria.

## 2011-05-29 NOTE — Discharge Instructions (Signed)
Increase fluids. Take your pain  medication every 4 hours. Continue Flomax. Prescription for antibiotics which you should start tomorrow afternoon.  Additional prescriptions for ibuprofen and nausea medicine.  Call Dr. Jerre Simon tomorrow if symptoms worsen, especially fevers or chills

## 2011-05-29 NOTE — ED Provider Notes (Signed)
History     CSN: [id number]  Arrival date & time 05/29/11  1649   First MD Initiated Contact with Patient 05/29/11 1710      Chief Complaint  Patient presents with  . Abdominal Pain    (Consider location/radiation/quality/duration/timing/severity/associated sxs/prior treatment) HPI.... status post procedure yesterday by urologist to pulverize right-sided kidney stone  With laser.  Patient now has pain in the suprapubic area. No fever, dysuria, sweats or chills. Pain is moderate to severe. No radiation. Nothing makes it better or worse  Past Medical History  Diagnosis Date  . Fibromyalgia   . Asthma   . Syringomyelia     thoracic spine  . GERD (gastroesophageal reflux disease)   . Chronic pain     Dr. Murray Hodgkins in [place]  . Renal disorder     kidney stones  . Kidney stone   . Chronic back pain     Past Surgical History  Procedure Date  . Renal lithiasis removed   . Esophagogastroduodenoscopy 01/2010    circumferential distal esophageal erosions, hiatal hernia, excoriations/erosion in gastric body ?trauma from vomiting, bx negative and showed chronic gastritis  . Colonoscopy 01/2010    scattered petechiae and fibrotic-appearing mucosa of uncertain significance , ?resolving infection, bx benign  . Breath hydrogen test 08/08/2010       . Hydrogen breath test 08/06/2010    Procedure: HYDROGEN BREATH TEST;  Surgeon: Corbin Ade, MD;  Location: [facility];  Service: Gastroenterology;  Laterality: N/A;    Family History  Problem Relation Age of Onset  . Multiple sclerosis Father   . Cervical cancer Mother   . Anesthesia problems Neg Hx   . Hypotension Neg Hx   . Malignant hyperthermia Neg Hx   . Pseudochol deficiency Neg Hx     History  Substance Use Topics  . Smoking status: Current Everyday Smoker -- 0.5 packs/day for 10 years    Types: Cigarettes  . Smokeless tobacco: Not on file  . Alcohol Use: No     rare    OB History    Grav Para Term Preterm Abortions TAB SAB Ect  Mult Living                  Review of Systems  All other systems reviewed and are negative.    Allergies  Sulfonamide derivatives  Home Medications   Current Outpatient Rx  Name Route Sig Dispense Refill  . OXYCODONE-ACETAMINOPHEN 7.5-325 MG PO TABS Oral Take 1 tablet by mouth every 6 (six) hours as needed for pain. 30 tablet 0  . PROMETHAZINE HCL 25 MG PO TABS Oral Take 25 mg by mouth every 6 (six) hours as needed. For nausea and vomiting    . CIPROFLOXACIN HCL 500 MG PO TABS Oral Take 1 tablet (500 mg total) by mouth every 12 (twelve) hours. 20 tablet 0  . IBUPROFEN 800 MG PO TABS Oral Take 1 tablet (800 mg total) by mouth 3 (three) times daily. 30 tablet 0  . MEDROXYPROGESTERONE ACETATE 150 MG/ML IM SUSP Intramuscular Inject 150 mg into the muscle every 3 (three) months.    . ONDANSETRON HCL 8 MG PO TABS Oral Take 1 tablet (8 mg total) by mouth every 4 (four) hours as needed for nausea. 10 tablet 0    BP 100/52  Pulse 123  Temp(Src) 97.8 F (36.6 C) (Oral)  Resp 20  Ht 5' (1.524 m)  Wt 240 lb (108.863 kg)  BMI 46.87 kg/m2  SpO2 98%  LMP  04/28/2011  Physical Exam  Nursing note and vitals reviewed. Constitutional: She is oriented to person, place, and time. She appears well-developed and well-nourished.  HENT:  Head: Normocephalic and atraumatic.  Eyes: Conjunctivae and EOM are normal. Pupils are equal, round, and reactive to light.  Neck: Normal range of motion. Neck supple.  Cardiovascular: Normal rate and regular rhythm.   Pulmonary/Chest: Effort normal and breath sounds normal.  Abdominal: Bowel sounds are normal.       Tender in suprapubic area  Musculoskeletal: Normal range of motion.  Neurological: She is alert and oriented to person, place, and time.  Skin: Skin is warm and dry.  Psychiatric: She has a normal mood and affect.    ED Course  Procedures (including critical care time)  Labs Reviewed  URINALYSIS, ROUTINE W REFLEX MICROSCOPIC - Abnormal;  Notable for the following:    APPearance HAZY (*)    Specific Gravity, Urine >1.030 (*)    Hgb urine dipstick LARGE (*)    Ketones, ur 15 (*)    Protein, ur 100 (*)    Nitrite POSITIVE (*)    Leukocytes, UA MODERATE (*)    All other components within normal limits  CBC - Abnormal; Notable for the following:    WBC 16.9 (*)    All other components within normal limits  DIFFERENTIAL - Abnormal; Notable for the following:    Neutrophils Relative 90 (*)    Neutro Abs 15.3 (*)    Lymphocytes Relative 4 (*)    All other components within normal limits  BASIC METABOLIC PANEL - Abnormal; Notable for the following:    Glucose, Bld 119 (*)    All other components within normal limits  URINE MICROSCOPIC-ADD ON - Abnormal; Notable for the following:    Bacteria, UA MANY (*)    All other components within normal limits  PREGNANCY, URINE  URINE CULTURE   Dg Retrograde Pyelogram  05/28/2011  *RADIOLOGY REPORT*  Clinical Data: Right ureteral calculus, post balloon dilatation, laser, basket extraction and stent placement  RETROGRADE PYELOGRAM  Comparison: 12/08/2007 Correlation: CT abdomen and pelvis 05/19/2011  Findings: Early images demonstrate an irregular filling defect within the distal right ureter compatible with a right ureteral calculus, roughly corresponding in size and position with the finding noted on CT. Proximally the right ureter is minimally prominent. Mild right hydronephrosis present. Balloon dilatation of the distal ureter performed. Ureteral stent placed from the renal pelvis to the urinary bladder. Two linear radiopacities project over the pelvis on the final series, of uncertain etiology.  IMPRESSION: Distal right ureteral calculus with proximal right hydroureter and hydronephrosis.  Original Report Authenticated By: Lollie Marrow, M.D.   Dg Abd Acute W/chest  05/29/2011  *RADIOLOGY REPORT*  Clinical Data: Recent kidney stone removal.  Abdominal pain.  ACUTE ABDOMEN SERIES  (ABDOMEN 2 VIEW & CHEST 1 VIEW)  Comparison: CT abdomen pelvis 05/19/2011 and of acute abdominal series 08/09/2010.  Findings: Frontal view of the chest shows midline trachea and normal heart size.  Linear subsegmental atelectasis in the lingula. Lungs are otherwise clear.  No pleural fluid.  Two views of the abdomen show a double-J right ureteral stent with the proximal loop formed in the expected location of the right renal pelvis and distal loop formed in the expected location of the bladder.  There are tiny calcifications projecting over the right renal outline.  No definite calcifications are seen along the course of the stent.  Probable tiny stone in the left kidney.  Gas and stool are seen in minimally prominent proximal colon.  IMPRESSION:  1.  No acute findings. 2.  Bilateral renal stones with double-J right ureteral stent in place.  Original Report Authenticated By: Reyes Ivan, M.D.     1. Kidney stone on right side   2. Urinary tract infection       MDM  No flank tenderness. No fever or chills. Suspect patient has contracted urinary tract infection secondary to instrumentation. IV Rocephin given in ED. Patient rechecked several times.  At discharge patient was hemodynamically stable. Rx Cipro for infection, continue Percocet and Flomax, prescriptions for ibuprofen and Zofran given.  Encouraged to increase fluids.        Donnetta Hutching, MD 05/29/11 2141

## 2011-05-31 ENCOUNTER — Encounter (HOSPITAL_COMMUNITY): Payer: Self-pay | Admitting: Urology

## 2011-05-31 LAB — URINE CULTURE: Colony Count: >=100000

## 2011-06-01 NOTE — ED Notes (Signed)
+   Urine  Treated per protocol MD; Sensitive to same 

## 2011-07-29 ENCOUNTER — Ambulatory Visit (HOSPITAL_COMMUNITY)
Admission: RE | Admit: 2011-07-29 | Discharge: 2011-07-29 | Disposition: A | Discharge status: Home / Self Care | Payer: BC Managed Care – PPO | Source: Ambulatory Visit | Attending: Family Medicine | Admitting: Family Medicine

## 2011-07-29 ENCOUNTER — Other Ambulatory Visit: Payer: Self-pay | Admitting: Family Medicine

## 2011-07-29 DIAGNOSIS — M25561 Pain in right knee: Secondary | ICD-10-CM

## 2011-07-29 DIAGNOSIS — M25569 Pain in unspecified knee: Secondary | ICD-10-CM | POA: Insufficient documentation

## 2011-08-29 ENCOUNTER — Encounter: Payer: Self-pay | Admitting: Orthopedic Surgery

## 2011-08-29 ENCOUNTER — Ambulatory Visit (INDEPENDENT_AMBULATORY_CARE_PROVIDER_SITE_OTHER): Payer: BC Managed Care – PPO | Admitting: Orthopedic Surgery

## 2011-08-29 VITALS — BP 90/64 | Ht 60.0 in | Wt 258.0 lb

## 2011-08-29 DIAGNOSIS — M25561 Pain in right knee: Secondary | ICD-10-CM | POA: Insufficient documentation

## 2011-08-29 DIAGNOSIS — M25569 Pain in unspecified knee: Secondary | ICD-10-CM

## 2011-08-29 MED ORDER — NAPROXEN 500 MG PO TABS: 500.0000 mg | ORAL_TABLET | Freq: Two times a day (BID) | ORAL | Status: DC

## 2011-08-29 MED ORDER — HYDROCODONE-ACETAMINOPHEN 5-325 MG PO TABS: 1.0000 | ORAL_TABLET | Freq: Four times a day (QID) | ORAL | Status: AC | PRN

## 2011-08-29 NOTE — Patient Instructions (Addendum)
PT IN [facility]   PICK UP PRESCRIPTION AT PHARMACY  TAKE SECOND PRESCRIPTION TO PHARMACY   DIAGNOSIS ANTERIOR KNEE PAIN SYNDROME WITH CHONDROMALACIA   Chondromalacia Your exam shows your knee pain is likely due to a cartilage swelling and irritation under the knee cap called chondromalacia. The knee cap moves up and down in its groove when you walk, run, or squat. It can become irritated from sports or work activities if the knee cap is not lined up perfectly or your quadriceps muscle is relatively weak. This can cause pain, usually around the knee cap but sometimes the back of the knee. It is most common in young and active people. Climbing stairs, prolonged sitting and rising from a chair will often make the pain worse. Treatment includes rest from activities which make it worse. The pain can be reduced with ice packs and anti-inflammatory pain medicine. Exercises to strengthen the thigh (quadriceps) muscle may help prevent further episodes of this condition. Shoe inserts to correct imbalances in the legs or feet may be prescribed by your doctor or a specialist. Support for the knee cap with a light brace may also be helpful. Call your caregiver if you are not improving after 2 - 3 weeks of treatment.   SEEK MEDICAL CARE IF:   You have increasing pain or your knee becomes hot, swollen, red, or begins to give out or lock up on you. Document Released: 02/08/2004 Document Revised: 12/20/2010 Document Reviewed: 06/28/2008 [facility] Patient Information 2012 [place], [place].

## 2011-08-29 NOTE — Progress Notes (Signed)
  Subjective:    Patient ID: [name] [name], female    DOB: [birth year], 23 y.o.   MRN: [id number]  Knee Pain  The incident occurred more than 1 week ago. The incident occurred at home. There was no injury mechanism. The pain is present in the right knee. The quality of the pain is described as aching. The pain is moderate. The pain has been intermittent since onset. Pertinent negatives include no inability to bear weight, loss of motion, loss of sensation, muscle weakness, numbness or tingling. Associated symptoms comments: CATCHING AND LOCKING .      Review of Systems  Neurological: Negative for tingling and numbness.  Psychiatric/Behavioral:       DEPRESSION        Objective:   Physical Exam  Nursing note and vitals reviewed. Constitutional: She is oriented to person, place, and time. She appears well-developed and well-nourished.       OBESE  Cardiovascular: Intact distal pulses.   Lymphadenopathy:    She has no cervical adenopathy.  Neurological: She is alert and oriented to person, place, and time. She has normal reflexes.  Skin: Skin is warm and dry.  Psychiatric: She has a normal mood and affect. Her behavior is normal. Thought content normal.  Right Knee Exam   Tenderness  The patient is experiencing tenderness in the patella and medial retinaculum.  Range of Motion  The patient has normal right knee ROM. Extension: normal  Flexion: normal   Muscle Strength   The patient has normal right knee strength.  Tests  McMurray:  Medial - negative Lateral - negative Drawer:       Anterior - negative    Posterior - negative Varus: negative Valgus: negative  Other  Erythema: absent Scars: absent Sensation: normal Pulse: present Swelling: none  Comments:  Patellofemoral pain, and painful crepitation with range of motion.  No real apprehension   Left Knee Exam  Left knee exam is normal.  Tenderness  The patient is experiencing no tenderness.     Range of  Motion  The patient has normal left knee ROM.  Muscle Strength   The patient has normal left knee strength.  Tests  McMurray:  Medial - negative  Drawer:       Anterior - negative     Posterior - negative Varus: negative Valgus: negative Patellar Apprehension: negative  Other  Erythema: absent Scars: absent Sensation: normal Pulse: present Swelling: none    Imaging of the knee was normal      Assessment & Plan:  Anterior knee pain syndrome.  Recommend physical therapy.  Naprosyn 500 mg twice a day.  Norco 5 mg q.6 p.r.n. pain

## 2011-09-12 ENCOUNTER — Telehealth: Payer: Self-pay | Admitting: Family Medicine

## 2011-09-12 NOTE — Telephone Encounter (Signed)
Called patient, left message.

## 2011-09-12 NOTE — Telephone Encounter (Signed)
Patient aware.

## 2011-09-12 NOTE — Telephone Encounter (Signed)
CONTINUE NAPROXEN AND ADD TYLENOL AND BIOFREEZE AS NEEDED   NO MORE NARCOTICS ADVISED

## 2011-10-05 ENCOUNTER — Emergency Department (HOSPITAL_COMMUNITY)
Admission: EM | Admit: 2011-10-05 | Discharge: 2011-10-05 | Disposition: A | Discharge status: Home / Self Care | Payer: BC Managed Care – PPO | Attending: Emergency Medicine | Admitting: Emergency Medicine

## 2011-10-05 ENCOUNTER — Encounter (HOSPITAL_COMMUNITY): Payer: Self-pay | Admitting: Emergency Medicine

## 2011-10-05 DIAGNOSIS — M4120 Other idiopathic scoliosis, site unspecified: Secondary | ICD-10-CM | POA: Insufficient documentation

## 2011-10-05 DIAGNOSIS — IMO0001 Reserved for inherently not codable concepts without codable children: Secondary | ICD-10-CM | POA: Insufficient documentation

## 2011-10-05 DIAGNOSIS — F17200 Nicotine dependence, unspecified, uncomplicated: Secondary | ICD-10-CM | POA: Insufficient documentation

## 2011-10-05 DIAGNOSIS — L299 Pruritus, unspecified: Secondary | ICD-10-CM | POA: Insufficient documentation

## 2011-10-05 DIAGNOSIS — Z2089 Contact with and (suspected) exposure to other communicable diseases: Secondary | ICD-10-CM

## 2011-10-05 DIAGNOSIS — G8929 Other chronic pain: Secondary | ICD-10-CM | POA: Insufficient documentation

## 2011-10-05 DIAGNOSIS — K219 Gastro-esophageal reflux disease without esophagitis: Secondary | ICD-10-CM | POA: Insufficient documentation

## 2011-10-05 HISTORY — DX: Depression, unspecified: F32.A

## 2011-10-05 HISTORY — DX: Major depressive disorder, single episode, unspecified: F32.9

## 2011-10-05 HISTORY — DX: Scoliosis, unspecified: M41.9

## 2011-10-05 MED ORDER — PERMETHRIN 5 % EX CREA: TOPICAL_CREAM | CUTANEOUS | Status: DC

## 2011-10-05 NOTE — ED Provider Notes (Signed)
History     CSN: [id number]  Arrival date & time 10/05/11  1605   First MD Initiated Contact with Patient 10/05/11 1620      Chief Complaint  Patient presents with  . Rash  . Pruritis    (Consider location/radiation/quality/duration/timing/severity/associated sxs/prior treatment) HPI Comments: Patient c/o sudden onset of a rash to both arms and itching to same.  States that her stepbrother recently stayed overnight at her house and he was recently diagnosed with scabies.  States he has been treated.  States she had several "red bumps"  to her arms that have since resolved, but wanted to come to ED "to get checked for scabies".  She denies fever, pain, recent tick bite, swelling or blisters.    Patient is a 21 y.o. female presenting with rash. The history is provided by the patient.  Rash  This is a new problem.    Past Medical History  Diagnosis Date  . Fibromyalgia   . Asthma   . Syringomyelia     thoracic spine  . GERD (gastroesophageal reflux disease)   . Chronic pain     Dr. Murray Hodgkins in [place]  . Renal disorder     kidney stones  . Kidney stone   . Chronic back pain   . DDD (degenerative disc disease)   . Scoliosis   . Bulging disc   . Spinal bifida, closed   . Depression     Past Surgical History  Procedure Date  . Renal lithiasis removed   . Esophagogastroduodenoscopy 01/2010    circumferential distal esophageal erosions, hiatal hernia, excoriations/erosion in gastric body ?trauma from vomiting, bx negative and showed chronic gastritis  . Colonoscopy 01/2010    scattered petechiae and fibrotic-appearing mucosa of uncertain significance , ?resolving infection, bx benign  . Breath hydrogen test 08/08/2010       . Hydrogen breath test 08/06/2010    Procedure: HYDROGEN BREATH TEST;  Surgeon: Corbin Ade, MD;  Location: [facility];  Service: Gastroenterology;  Laterality: N/A;  . Cystoscopy w/ ureteral stent placement 05/28/2011    Procedure: CYSTOSCOPY WITH RETROGRADE  PYELOGRAM/URETERAL STENT PLACEMENT;  Surgeon: Ky Barban, MD;  Location: [facility];  Service: Urology;  Laterality: Right;  Cystoscopy with Right Retrograde Pyelogram  . Stone extraction with basket 05/28/2011    Procedure: STONE EXTRACTION WITH BASKET;  Surgeon: Ky Barban, MD;  Location: [facility];  Service: Urology;  Laterality: Right;  . Cystoscopy with urethral dilatation 05/28/2011    Procedure: CYSTOSCOPY WITH URETHRAL DILATATION;  Surgeon: Ky Barban, MD;  Location: [facility];  Service: Urology;  Laterality: N/A;    Family History  Problem Relation Age of Onset  . Multiple sclerosis Father   . Cervical cancer Mother   . Anesthesia problems Neg Hx   . Hypotension Neg Hx   . Malignant hyperthermia Neg Hx   . Pseudochol deficiency Neg Hx   . Heart disease    . Lung disease    . Asthma    . Diabetes    . Kidney disease      History  Substance Use Topics  . Smoking status: Current Every Day Smoker -- 0.5 packs/day for 10 years    Types: Cigarettes  . Smokeless tobacco: Never Used  . Alcohol Use: 0.0 oz/week     rare    OB History    Grav Para Term Preterm Abortions TAB SAB Ect Mult Living   1 1 1  1      Review of Systems  Constitutional: Negative for fever, chills, activity change and appetite change.  HENT: Negative for sore throat, facial swelling, trouble swallowing, neck pain and neck stiffness.   Respiratory: Negative for chest tightness, shortness of breath and wheezing.   Musculoskeletal: Negative for back pain and arthralgias.  Skin: Positive for rash. Negative for wound.  Neurological: Negative for dizziness, weakness, numbness and headaches.  All other systems reviewed and are negative.    Allergies  Sulfonamide derivatives  Home Medications   Current Outpatient Rx  Name Route Sig Dispense Refill  . ESCITALOPRAM OXALATE 20 MG PO TABS Oral Take 20 mg by mouth daily.    Marland Kitchen MEDROXYPROGESTERONE ACETATE 150 MG/ML IM SUSP Intramuscular  Inject 150 mg into the muscle every 3 (three) months.    Marland Kitchen NAPROXEN 500 MG PO TABS Oral Take 1 tablet (500 mg total) by mouth 2 (two) times daily with a meal. 60 tablet 2  . ZOLPIDEM TARTRATE 10 MG PO TABS Oral Take 10 mg by mouth at bedtime as needed.      BP 135/81  Pulse 102  Temp 98.3 F (36.8 C) (Oral)  Resp 16  Ht 5' (1.524 m)  Wt 240 lb (108.863 kg)  BMI 46.87 kg/m2  SpO2 98%  Physical Exam  Nursing note and vitals reviewed. Constitutional: She is oriented to person, place, and time. She appears well-developed and well-nourished. No distress.  HENT:  Head: Normocephalic and atraumatic.  Mouth/Throat: Oropharynx is clear and moist.  Neck: Normal range of motion. Neck supple.  Cardiovascular: Normal rate, regular rhythm and normal heart sounds.   Pulmonary/Chest: Effort normal and breath sounds normal.  Musculoskeletal: She exhibits no edema and no tenderness.  Lymphadenopathy:    She has no cervical adenopathy.  Neurological: She is alert and oriented to person, place, and time. She exhibits normal muscle tone. Coordination normal.  Skin: Skin is warm and dry. No rash noted. No erythema.       nml appearing skin exam.  No obvious rash, erythema, or edema    ED Course  Procedures (including critical care time)  Labs Reviewed - No data to display      MDM   Pt is alert, NAD.  Well appearing. States she had a recent exposure to scabies, but does not appear to have a rash/skin lesions at this time.  I will prescribe permethrin cream and pt agrees to use if rash occurs.  Also agrees to take benadryl as needed for itching  The patient appears reasonably screened and/or stabilized for discharge and I doubt any other medical condition or other [facility] requiring further screening, evaluation, or treatment in the ED at this time prior to discharge.        Mimie Goering L. Trisha Mangle, [place] 10/08/11 2014

## 2011-10-05 NOTE — ED Notes (Signed)
Pt. Has been around stepbrother who didn't inform anyone that he was diagnosed with scabies; came to emergency room when started itching just to get checked out

## 2011-10-09 NOTE — ED Provider Notes (Signed)
Medical screening examination/treatment/procedure(s) were performed by non-physician practitioner and as supervising physician I was immediately available for consultation/collaboration.   Avrum Kimball W. Tmya Wigington, MD 10/09/11 0029 

## 2011-10-14 ENCOUNTER — Ambulatory Visit: Payer: BC Managed Care – PPO | Admitting: Orthopedic Surgery

## 2011-10-18 ENCOUNTER — Encounter (HOSPITAL_COMMUNITY): Payer: Self-pay | Admitting: Emergency Medicine

## 2011-10-18 ENCOUNTER — Emergency Department (HOSPITAL_COMMUNITY)
Admission: EM | Admit: 2011-10-18 | Discharge: 2011-10-18 | Disposition: A | Discharge status: Home / Self Care | Payer: BC Managed Care – PPO | Attending: Emergency Medicine | Admitting: Emergency Medicine

## 2011-10-18 ENCOUNTER — Emergency Department (HOSPITAL_COMMUNITY): Payer: BC Managed Care – PPO

## 2011-10-18 DIAGNOSIS — R109 Unspecified abdominal pain: Secondary | ICD-10-CM | POA: Insufficient documentation

## 2011-10-18 DIAGNOSIS — N200 Calculus of kidney: Secondary | ICD-10-CM | POA: Insufficient documentation

## 2011-10-18 DIAGNOSIS — N390 Urinary tract infection, site not specified: Secondary | ICD-10-CM

## 2011-10-18 DIAGNOSIS — R509 Fever, unspecified: Secondary | ICD-10-CM | POA: Insufficient documentation

## 2011-10-18 LAB — URINALYSIS, ROUTINE W REFLEX MICROSCOPIC
Ketones, ur: NEGATIVE mg/dL
Nitrite: NEGATIVE
Urobilinogen, UA: 0.2 mg/dL (ref 0.0–1.0)
pH: 7 (ref 5.0–8.0)

## 2011-10-18 LAB — URINE MICROSCOPIC-ADD ON

## 2011-10-18 MED ORDER — HYDROCODONE-ACETAMINOPHEN 7.5-325 MG PO TABS: 1.0000 | ORAL_TABLET | Freq: Four times a day (QID) | ORAL | Status: DC | PRN

## 2011-10-18 MED ORDER — KETOROLAC TROMETHAMINE 30 MG/ML IJ SOLN
30.0000 mg | Freq: Once | INTRAMUSCULAR | Status: AC
  Administered 2011-10-18: 30 mg via INTRAVENOUS
  Filled 2011-10-18: qty 1

## 2011-10-18 MED ORDER — ONDANSETRON HCL 4 MG/2ML IJ SOLN
4.0000 mg | Freq: Once | INTRAMUSCULAR | Status: AC
  Administered 2011-10-18: 4 mg via INTRAVENOUS
  Filled 2011-10-18: qty 2

## 2011-10-18 MED ORDER — DEXTROSE 5 % IV SOLN
1.0000 g | Freq: Once | INTRAVENOUS | Status: AC
  Administered 2011-10-18: 1 g via INTRAVENOUS
  Filled 2011-10-18: qty 10

## 2011-10-18 MED ORDER — CEPHALEXIN 500 MG PO CAPS: 500.0000 mg | ORAL_CAPSULE | Freq: Four times a day (QID) | ORAL | Status: DC

## 2011-10-18 NOTE — ED Notes (Signed)
History of kidney stones. Pt has had fever and right side flank pain since yesterday. Also complaining of blood in urine.

## 2011-10-18 NOTE — ED Provider Notes (Addendum)
History     CSN: [id number]  Arrival date & time 10/18/11  8295   First MD Initiated Contact with Patient 10/18/11 [phone]      Chief Complaint  Patient presents with  . Flank Pain  . Fever    (Consider location/radiation/quality/duration/timing/severity/associated sxs/prior treatment) Patient is a 21 y.o. female presenting with flank pain and fever. The history is provided by the patient.  Flank Pain This is a new problem. The current episode started yesterday. The problem occurs 2 to 4 times per day. The problem has been gradually worsening. Associated symptoms include chills, a fever, nausea and vomiting. Pertinent negatives include no abdominal pain, arthralgias, chest pain, coughing or neck pain. Nothing aggravates the symptoms. She has tried NSAIDs for the symptoms. The treatment provided mild relief.  Fever Primary symptoms of the febrile illness include fever, nausea and vomiting. Primary symptoms do not include cough, wheezing, shortness of breath, abdominal pain, dysuria or arthralgias.    Past Medical History  Diagnosis Date  . Fibromyalgia   . Asthma   . Syringomyelia     thoracic spine  . GERD (gastroesophageal reflux disease)   . Chronic pain     Dr. Murray Hodgkins in [place]  . Renal disorder     kidney stones  . Kidney stone   . Chronic back pain   . DDD (degenerative disc disease)   . Scoliosis   . Bulging disc   . Spinal bifida, closed   . Depression     Past Surgical History  Procedure Date  . Renal lithiasis removed   . Esophagogastroduodenoscopy 01/2010    circumferential distal esophageal erosions, hiatal hernia, excoriations/erosion in gastric body ?trauma from vomiting, bx negative and showed chronic gastritis  . Colonoscopy 01/2010    scattered petechiae and fibrotic-appearing mucosa of uncertain significance , ?resolving infection, bx benign  . Breath hydrogen test 08/08/2010       . Hydrogen breath test 08/06/2010    Procedure: HYDROGEN BREATH TEST;   Surgeon: Corbin Ade, MD;  Location: [facility];  Service: Gastroenterology;  Laterality: N/A;  . Cystoscopy w/ ureteral stent placement 05/28/2011    Procedure: CYSTOSCOPY WITH RETROGRADE PYELOGRAM/URETERAL STENT PLACEMENT;  Surgeon: Ky Barban, MD;  Location: [facility];  Service: Urology;  Laterality: Right;  Cystoscopy with Right Retrograde Pyelogram  . Stone extraction with basket 05/28/2011    Procedure: STONE EXTRACTION WITH BASKET;  Surgeon: Ky Barban, MD;  Location: [facility];  Service: Urology;  Laterality: Right;  . Cystoscopy with urethral dilatation 05/28/2011    Procedure: CYSTOSCOPY WITH URETHRAL DILATATION;  Surgeon: Ky Barban, MD;  Location: [facility];  Service: Urology;  Laterality: N/A;    Family History  Problem Relation Age of Onset  . Multiple sclerosis Father   . Cervical cancer Mother   . Anesthesia problems Neg Hx   . Hypotension Neg Hx   . Malignant hyperthermia Neg Hx   . Pseudochol deficiency Neg Hx   . Heart disease    . Lung disease    . Asthma    . Diabetes    . Kidney disease      History  Substance Use Topics  . Smoking status: Current Every Day Smoker -- 0.5 packs/day for 10 years    Types: Cigarettes  . Smokeless tobacco: Never Used  . Alcohol Use: 0.0 oz/week     rare    OB History    Grav Para Term Preterm Abortions TAB SAB Ect Mult Living  1 1 1       1       Review of Systems  Constitutional: Positive for fever and chills. Negative for activity change.       All ROS Neg except as noted in HPI  HENT: Negative for nosebleeds and neck pain.   Eyes: Negative for photophobia and discharge.  Respiratory: Negative for cough, shortness of breath and wheezing.   Cardiovascular: Negative for chest pain and palpitations.  Gastrointestinal: Positive for nausea and vomiting. Negative for abdominal pain and blood in stool.  Genitourinary: Positive for flank pain. Negative for dysuria, frequency and hematuria.  Musculoskeletal: Negative  for back pain and arthralgias.  Skin: Negative.   Neurological: Negative for dizziness, seizures and speech difficulty.  Psychiatric/Behavioral: Negative for hallucinations and confusion.    Allergies  Sulfonamide derivatives  Home Medications   Current Outpatient Rx  Name Route Sig Dispense Refill  . MEDROXYPROGESTERONE ACETATE 150 MG/ML IM SUSP Intramuscular Inject 150 mg into the muscle every 3 (three) months.    Marland Kitchen NAPROXEN 500 MG PO TABS Oral Take 1 tablet (500 mg total) by mouth 2 (two) times daily with a meal. 60 tablet 2    BP 124/71  Pulse 95  Temp 98.4 F (36.9 C) (Oral)  Resp 20  Ht 5' (1.524 m)  Wt 240 lb (108.863 kg)  BMI 46.87 kg/m2  SpO2 100%  Physical Exam  Nursing note and vitals reviewed. Constitutional: She is oriented to person, place, and time. She appears well-developed and well-nourished.  Non-toxic appearance.  HENT:  Head: Normocephalic.  Right Ear: Tympanic membrane and external ear normal.  Left Ear: Tympanic membrane and external ear normal.  Eyes: EOM and lids are normal. Pupils are equal, round, and reactive to light.  Neck: Normal range of motion. Neck supple. Carotid bruit is not present.  Cardiovascular: Normal rate, regular rhythm, normal heart sounds, intact distal pulses and normal pulses.   Pulmonary/Chest: Breath sounds normal. No respiratory distress.  Abdominal: Soft. Bowel sounds are normal. There is no tenderness. There is no guarding.       Right flank pain and Right CVA tenderness.  Musculoskeletal: Normal range of motion.  Lymphadenopathy:       Head (right side): No submandibular adenopathy present.       Head (left side): No submandibular adenopathy present.    She has no cervical adenopathy.  Neurological: She is alert and oriented to person, place, and time. She has normal strength. No cranial nerve deficit or sensory deficit.  Skin: Skin is warm and dry.  Psychiatric: She has a normal mood and affect. Her speech is  normal.    ED Course  Procedures (including critical care time)  Labs Reviewed  URINALYSIS, ROUTINE W REFLEX MICROSCOPIC - Abnormal; Notable for the following:    Hgb urine dipstick LARGE (*)     Leukocytes, UA MODERATE (*)     All other components within normal limits  URINE MICROSCOPIC-ADD ON - Abnormal; Notable for the following:    Squamous Epithelial / LPF MANY (*)     Bacteria, UA MANY (*)     All other components within normal limits  PREGNANCY, URINE   No results found.   No diagnosis found.    MDM  Pt examined. Labs reviewed. Questions answered. Temp 98.4. CT abd/pelvis is negative for kidney stones. UA is consistent with UTI. Pt treated with rocephin. Rx for keflex and norco given to the patient. Cullture sent to the lab.  Kathie Dike, PA 11/01/11 0230  Kathie Dike, PA 11/15/11 [phone]

## 2011-10-20 LAB — URINE CULTURE
Colony Count: 95000
Special Requests: NORMAL

## 2011-10-31 ENCOUNTER — Ambulatory Visit (INDEPENDENT_AMBULATORY_CARE_PROVIDER_SITE_OTHER): Payer: BC Managed Care – PPO | Admitting: Orthopedic Surgery

## 2011-10-31 ENCOUNTER — Encounter: Payer: Self-pay | Admitting: Orthopedic Surgery

## 2011-10-31 VITALS — BP 100/68 | Ht 60.0 in | Wt 258.0 lb

## 2011-10-31 DIAGNOSIS — IMO0002 Reserved for concepts with insufficient information to code with codable children: Secondary | ICD-10-CM

## 2011-10-31 DIAGNOSIS — S83206A Unspecified tear of unspecified meniscus, current injury, right knee, initial encounter: Secondary | ICD-10-CM

## 2011-10-31 DIAGNOSIS — S83242A Other tear of medial meniscus, current injury, left knee, initial encounter: Secondary | ICD-10-CM

## 2011-10-31 NOTE — Patient Instructions (Signed)
You have been scheduled for an MRI scan.  Your insurance company requires a precertification prior to scheduling the MRI.  If the MRI scan is not approved we will let you know and make further treatment recommendations according to your insurance's guidelines.   We will schedule you for another  appointment to review the results and make further treatment recommendations  

## 2011-10-31 NOTE — Progress Notes (Signed)
Patient ID: [name] [name], female   DOB: 12/14/1990, 21 y.o.   MRN: [id number] Chief Complaint  Patient presents with  . Follow-up    6 week recheck on right knee.    The patient has had several month history of anterior knee pain and has been to physical therapy tried anti-inflammatories and even narcotic pain medication with no real relief she still complains of popping especially after sitting for long period of time and occasionally the knee feels weak like it would give out  Reexamination of the knee shows that she walks without support does no tenderness pain or swelling around the patella there is some medial joint line tenderness range of motion is full strength is normal the knee feels stable she has good neurovascular exam  Review of systems was negative  Impression possible torn medial meniscus recommend MRI to rule out intra-articular pathology  If she has a normal MRI she will have to continue on anti-inflammatories activity modification and home exercises

## 2011-11-01 NOTE — ED Provider Notes (Addendum)
Medical screening examination/treatment/procedure(s) were performed by non-physician practitioner and as supervising physician I was immediately available for consultation/collaboration.  Flint Melter, MD 11/01/11 9811  Flint Melter, MD 11/15/11 [phone]

## 2011-11-15 NOTE — ED Provider Notes (Signed)
Medical screening examination/treatment/procedure(s) were performed by non-physician practitioner and as supervising physician I was immediately available for consultation/collaboration.  Flint Melter, MD 11/15/11 2118

## 2011-11-19 ENCOUNTER — Ambulatory Visit (HOSPITAL_COMMUNITY): Admission: RE | Admit: 2011-11-19 | Payer: BC Managed Care – PPO | Source: Ambulatory Visit

## 2011-11-25 ENCOUNTER — Ambulatory Visit (HOSPITAL_COMMUNITY): Admission: RE | Admit: 2011-11-25 | Payer: BC Managed Care – PPO | Source: Ambulatory Visit

## 2011-11-28 ENCOUNTER — Ambulatory Visit (HOSPITAL_COMMUNITY)
Admission: RE | Admit: 2011-11-28 | Discharge: 2011-11-28 | Disposition: A | Discharge status: Home / Self Care | Payer: BC Managed Care – PPO | Source: Ambulatory Visit | Attending: Orthopedic Surgery | Admitting: Orthopedic Surgery

## 2011-11-28 DIAGNOSIS — M23302 Other meniscus derangements, unspecified lateral meniscus, unspecified knee: Secondary | ICD-10-CM | POA: Insufficient documentation

## 2011-11-28 DIAGNOSIS — S83206A Unspecified tear of unspecified meniscus, current injury, right knee, initial encounter: Secondary | ICD-10-CM

## 2011-11-28 DIAGNOSIS — M25569 Pain in unspecified knee: Secondary | ICD-10-CM | POA: Insufficient documentation

## 2012-01-15 DIAGNOSIS — Z8742 Personal history of other diseases of the female genital tract: Secondary | ICD-10-CM

## 2012-01-15 HISTORY — DX: Personal history of other diseases of the female genital tract: Z87.42

## 2012-04-02 ENCOUNTER — Emergency Department (HOSPITAL_COMMUNITY): Payer: BC Managed Care – PPO

## 2012-04-02 ENCOUNTER — Encounter (HOSPITAL_COMMUNITY): Payer: Self-pay | Admitting: *Deleted

## 2012-04-02 ENCOUNTER — Emergency Department (HOSPITAL_COMMUNITY)
Admission: EM | Admit: 2012-04-02 | Discharge: 2012-04-03 | Disposition: A | Discharge status: Home / Self Care | Payer: BC Managed Care – PPO | Attending: Emergency Medicine | Admitting: Emergency Medicine

## 2012-04-02 ENCOUNTER — Encounter: Payer: Self-pay | Admitting: *Deleted

## 2012-04-02 DIAGNOSIS — J45909 Unspecified asthma, uncomplicated: Secondary | ICD-10-CM

## 2012-04-02 DIAGNOSIS — R1031 Right lower quadrant pain: Secondary | ICD-10-CM | POA: Insufficient documentation

## 2012-04-02 DIAGNOSIS — Z79899 Other long term (current) drug therapy: Secondary | ICD-10-CM | POA: Insufficient documentation

## 2012-04-02 DIAGNOSIS — Z8719 Personal history of other diseases of the digestive system: Secondary | ICD-10-CM | POA: Insufficient documentation

## 2012-04-02 DIAGNOSIS — Z8739 Personal history of other diseases of the musculoskeletal system and connective tissue: Secondary | ICD-10-CM | POA: Insufficient documentation

## 2012-04-02 DIAGNOSIS — F3289 Other specified depressive episodes: Secondary | ICD-10-CM | POA: Insufficient documentation

## 2012-04-02 DIAGNOSIS — Z87442 Personal history of urinary calculi: Secondary | ICD-10-CM | POA: Insufficient documentation

## 2012-04-02 DIAGNOSIS — Z3202 Encounter for pregnancy test, result negative: Secondary | ICD-10-CM | POA: Insufficient documentation

## 2012-04-02 DIAGNOSIS — F17200 Nicotine dependence, unspecified, uncomplicated: Secondary | ICD-10-CM | POA: Insufficient documentation

## 2012-04-02 DIAGNOSIS — G8929 Other chronic pain: Secondary | ICD-10-CM | POA: Insufficient documentation

## 2012-04-02 LAB — URINE MICROSCOPIC-ADD ON

## 2012-04-02 LAB — URINALYSIS, ROUTINE W REFLEX MICROSCOPIC
Bilirubin Urine: NEGATIVE
Glucose, UA: NEGATIVE mg/dL
Specific Gravity, Urine: >1.03 — ABNORMAL HIGH (ref 1.005–1.030)
Urobilinogen, UA: 0.2 mg/dL (ref 0.0–1.0)

## 2012-04-02 LAB — PREGNANCY, URINE: Preg Test, Ur: NEGATIVE

## 2012-04-02 MED ORDER — ONDANSETRON HCL 4 MG/2ML IJ SOLN
4.0000 mg | Freq: Once | INTRAMUSCULAR | Status: AC
  Administered 2012-04-02: 4 mg via INTRAVENOUS
  Filled 2012-04-02: qty 2

## 2012-04-02 MED ORDER — IOHEXOL 300 MG/ML  SOLN: 100.0000 mL | Freq: Once | INTRAMUSCULAR | Status: AC | PRN

## 2012-04-02 MED ORDER — IOHEXOL 300 MG/ML  SOLN
50.0000 mL | Freq: Once | INTRAMUSCULAR | Status: AC | PRN
  Administered 2012-04-02: 50 mL via ORAL

## 2012-04-02 NOTE — ED Notes (Signed)
RLQ pain for 30 min, nausea, no vomiting,  No injury.  No diarrhea.

## 2012-04-02 NOTE — ED Provider Notes (Signed)
History  This chart was scribed for Gilda Crease,  by Shari Heritage, ED Scribe. The patient was seen in room [facility]06/[facility]06. Patient's care was started at 2124.   CSN: [id number]  Arrival date & time 04/02/12  2055   First MD Initiated Contact with Patient 04/02/12 2124      Chief Complaint  Patient presents with  . Abdominal Pain     The history is provided by the patient. No language interpreter was used.    HPI Comments: [name] [name] is a 22 y.o. female who presents to the Emergency Department complaining of sudden, moderate to severe, constant right lower quadrant pain onset less than 1 hour ago. Patient states that current pain is in a different location and is more severe than past pain associated with kidney stones which was located in the flank. There is associated nausea and dry heaving. She states that she has eaten one meal today. Patient denies any injury or trauma to the abdomen. There is no vomiting or diarrhea. Patient's other medical history includes asthma, GERD and chronic back pain.   Past Medical History  Diagnosis Date  . Fibromyalgia   . Asthma   . Syringomyelia     thoracic spine  . GERD (gastroesophageal reflux disease)   . Chronic pain     Dr. Murray Hodgkins in [place]  . Chronic back pain   . DDD (degenerative disc disease)   . Scoliosis   . Bulging disc   . Spinal bifida, closed   . Depression   . Renal disorder     kidney stones  . Kidney stone     Past Surgical History  Procedure Laterality Date  . Renal lithiasis removed    . Esophagogastroduodenoscopy  01/2010    circumferential distal esophageal erosions, hiatal hernia, excoriations/erosion in gastric body ?trauma from vomiting, bx negative and showed chronic gastritis  . Colonoscopy  01/2010    scattered petechiae and fibrotic-appearing mucosa of uncertain significance , ?resolving infection, bx benign  . Breath hydrogen test  08/08/2010       . Hydrogen breath test  08/06/2010    Procedure:  HYDROGEN BREATH TEST;  Surgeon: Corbin Ade, MD;  Location: [facility];  Service: Gastroenterology;  Laterality: N/A;  . Cystoscopy w/ ureteral stent placement  05/28/2011    Procedure: CYSTOSCOPY WITH RETROGRADE PYELOGRAM/URETERAL STENT PLACEMENT;  Surgeon: Ky Barban, MD;  Location: [facility];  Service: Urology;  Laterality: Right;  Cystoscopy with Right Retrograde Pyelogram  . Stone extraction with basket  05/28/2011    Procedure: STONE EXTRACTION WITH BASKET;  Surgeon: Ky Barban, MD;  Location: [facility];  Service: Urology;  Laterality: Right;  . Cystoscopy with urethral dilatation  05/28/2011    Procedure: CYSTOSCOPY WITH URETHRAL DILATATION;  Surgeon: Ky Barban, MD;  Location: [facility];  Service: Urology;  Laterality: N/A;    Family History  Problem Relation Age of Onset  . Multiple sclerosis Father   . Cervical cancer Mother   . Anesthesia problems Neg Hx   . Hypotension Neg Hx   . Malignant hyperthermia Neg Hx   . Pseudochol deficiency Neg Hx   . Heart disease    . Lung disease    . Asthma    . Diabetes    . Kidney disease      History  Substance Use Topics  . Smoking status: Current Every Day Smoker -- 0.50 packs/day for 10 years    Types: Cigarettes  . Smokeless tobacco:  Never Used  . Alcohol Use: 0.0 oz/week     Comment: rare    OB History   Grav Para Term Preterm Abortions TAB SAB Ect Mult Living   1 1 1       1       Review of Systems  Gastrointestinal: Positive for nausea and abdominal pain. Negative for vomiting and diarrhea.  All other systems reviewed and are negative.    Allergies  Sulfonamide derivatives  Home Medications   Current Outpatient Rx  Name  Route  Sig  Dispense  Refill  . cephALEXin (KEFLEX) 500 MG capsule   Oral   Take 1 capsule (500 mg total) by mouth 4 (four) times daily.   20 capsule   0   . HYDROcodone-acetaminophen (NORCO) 7.5-325 MG per tablet   Oral   Take 1 tablet by mouth every 6 (six) hours as needed for  pain.   30 tablet   0   . medroxyPROGESTERone (DEPO-PROVERA) 150 MG/ML injection   Intramuscular   Inject 150 mg into the muscle every 3 (three) months.         . naproxen (NAPROSYN) 500 MG tablet   Oral   Take 1 tablet (500 mg total) by mouth 2 (two) times daily with a meal.   60 tablet   2     Triage Vitals: BP 147/98  Pulse 119  Temp(Src) 97.5 F (36.4 C) (Oral)  Resp 24  Ht 5' (1.524 m)  Wt 240 lb (108.863 kg)  BMI 46.87 kg/m2  SpO2 98%  LMP 03/05/2012  Physical Exam  Constitutional: She is oriented to person, place, and time. She appears well-developed and well-nourished. No distress.  HENT:  Head: Normocephalic and atraumatic.  Right Ear: Hearing normal.  Nose: Nose normal.  Mouth/Throat: Oropharynx is clear and moist and mucous membranes are normal.  Eyes: Conjunctivae and EOM are normal. Pupils are equal, round, and reactive to light.  Neck: Normal range of motion. Neck supple.  Cardiovascular: Normal rate, regular rhythm, S1 normal and S2 normal.  Exam reveals no gallop and no friction rub.   No murmur heard. Pulmonary/Chest: Effort normal and breath sounds normal. No respiratory distress. She exhibits no tenderness.  Abdominal: Soft. Normal appearance and bowel sounds are normal. There is no hepatosplenomegaly. There is no tenderness. There is no rebound, no guarding, no tenderness at McBurney's point and negative Murphy's sign. No hernia.  Musculoskeletal: Normal range of motion.  Neurological: She is alert and oriented to person, place, and time. She has normal strength. No cranial nerve deficit or sensory deficit. Coordination normal. GCS eye subscore is 4. GCS verbal subscore is 5. GCS motor subscore is 6.  Skin: Skin is warm, dry and intact. No rash noted. No cyanosis.  Psychiatric: She has a normal mood and affect. Her speech is normal and behavior is normal. Thought content normal.    ED Course  Procedures (including critical care time) DIAGNOSTIC  STUDIES: Oxygen Saturation is 98% on room air, normal by my interpretation.    COORDINATION OF CARE: 9:41 PM- Patient informed of current plan for treatment and evaluation and agrees with plan at this time.      Labs Reviewed  URINALYSIS, ROUTINE W REFLEX MICROSCOPIC - Abnormal; Notable for the following:    APPearance CLOUDY (*)    Specific Gravity, Urine >1.030 (*)    Hgb urine dipstick LARGE (*)    Protein, ur TRACE (*)    All other components within normal limits  URINE MICROSCOPIC-ADD ON - Abnormal; Notable for the following:    Squamous Epithelial / LPF MANY (*)    Bacteria, UA FEW (*)    Casts GRANULAR CAST (*)    All other components within normal limits  CBC WITH DIFFERENTIAL - Abnormal; Notable for the following:    WBC 14.5 (*)    RBC 5.24 (*)    Hemoglobin 15.4 (*)    Neutrophils Relative 80 (*)    Neutro Abs 11.6 (*)    All other components within normal limits  URINE CULTURE  PREGNANCY, URINE  BASIC METABOLIC PANEL   No results found.   Diagnosis: Abdominal pain    MDM  She comes to the ER with complaints of right lower quadrant abdominal pain that began 30 minutes prior to arrival. Patient reports the pain was somewhat sudden in onset. She does have a history of kidney stones but this feels worse. She says her kidney stones normal and her back as well, never in the abdomen area. Examination did reveal tenderness in the right lower quadrant which would be unusual for kidney stones, but not impossible. Because of the tenderness, CAT scan was ordered to further evaluate for possibility of appendicitis. CAT scan pending at this time. Case signed out to Dr. Colon Branch, oncoming ER physician to disposition based on the CT. If kidney stone, treated with analgesia and discharge. If appendicitis, surgery consult.    I personally performed the services described in this documentation, which was scribed in my presence. The recorded information has been reviewed and is  accurate.    Gilda Crease, MD 04/03/12 [phone]

## 2012-04-03 ENCOUNTER — Encounter: Payer: Self-pay | Admitting: Family Medicine

## 2012-04-03 ENCOUNTER — Ambulatory Visit (INDEPENDENT_AMBULATORY_CARE_PROVIDER_SITE_OTHER): Payer: BC Managed Care – PPO | Admitting: Family Medicine

## 2012-04-03 VITALS — Temp 98.1°F | Ht 60.0 in | Wt 277.4 lb

## 2012-04-03 DIAGNOSIS — N200 Calculus of kidney: Secondary | ICD-10-CM | POA: Insufficient documentation

## 2012-04-03 DIAGNOSIS — J45909 Unspecified asthma, uncomplicated: Secondary | ICD-10-CM | POA: Insufficient documentation

## 2012-04-03 DIAGNOSIS — M797 Fibromyalgia: Secondary | ICD-10-CM | POA: Insufficient documentation

## 2012-04-03 DIAGNOSIS — N739 Female pelvic inflammatory disease, unspecified: Secondary | ICD-10-CM

## 2012-04-03 LAB — CBC WITH DIFFERENTIAL/PLATELET
Basophils Absolute: 0.1 10*3/uL (ref 0.0–0.1)
Basophils Relative: 1 % (ref 0–1)
Eosinophils Relative: 2 % (ref 0–5)
HCT: 44.8 % (ref 36.0–46.0)
MCHC: 34.4 g/dL (ref 30.0–36.0)
MCV: 85.5 fL (ref 78.0–100.0)
Monocytes Absolute: 0.6 10*3/uL (ref 0.1–1.0)
Neutro Abs: 11.6 10*3/uL — ABNORMAL HIGH (ref 1.7–7.7)
Platelets: 238 10*3/uL (ref 150–400)
RDW: 12.6 % (ref 11.5–15.5)
WBC: 14.5 10*3/uL — ABNORMAL HIGH (ref 4.0–10.5)

## 2012-04-03 LAB — BASIC METABOLIC PANEL
Calcium: 9.5 mg/dL (ref 8.4–10.5)
Creatinine, Ser: 0.74 mg/dL (ref 0.50–1.10)
GFR calc Af Amer: >90 mL/min (ref >90)
Sodium: 133 mEq/L — ABNORMAL LOW (ref 135–145)

## 2012-04-03 MED ORDER — HYDROCODONE-ACETAMINOPHEN 5-325 MG PO TABS: 1.0000 | ORAL_TABLET | ORAL | Status: DC | PRN

## 2012-04-03 MED ORDER — CEFTRIAXONE SODIUM 500 MG IJ SOLR
500.0000 mg | Freq: Once | INTRAMUSCULAR | Status: AC
  Administered 2012-04-03: 500 mg via INTRAMUSCULAR

## 2012-04-03 MED ORDER — IOHEXOL 300 MG/ML  SOLN
100.0000 mL | Freq: Once | INTRAMUSCULAR | Status: AC | PRN
  Administered 2012-04-03: 100 mL via INTRAVENOUS

## 2012-04-03 MED ORDER — DOXYCYCLINE HYCLATE 100 MG PO CAPS: 100.0000 mg | ORAL_CAPSULE | Freq: Two times a day (BID) | ORAL | Status: DC

## 2012-04-03 MED ORDER — ONDANSETRON 4 MG PO TBDP: 4.0000 mg | ORAL_TABLET | Freq: Three times a day (TID) | ORAL | Status: DC | PRN

## 2012-04-03 NOTE — ED Notes (Signed)
Patient would like something for pain at this time. 

## 2012-04-03 NOTE — Progress Notes (Signed)
  Subjective:    Patient ID: [name] [name], female    DOB: 01-22-1990, 22 y.o.   MRN: [id number]  Abdominal Pain This is a new problem. The current episode started in the past 7 days. The onset quality is sudden. The problem occurs constantly. The problem has been gradually worsening. The pain is located in the RLQ, periumbilical region and suprapubic region. The pain is at a severity of 5/10. The pain is moderate. The quality of the pain is aching, cramping and dull. The abdominal pain does not radiate. Associated symptoms include anorexia (worse past two days) and nausea. Pertinent negatives include no fever, flatus, frequency, headaches or hematochezia. Nothing aggravates the pain.      Review of Systems  Constitutional: Positive for appetite change. Negative for fever and activity change.  HENT: Negative for congestion and neck pain.   Respiratory: Negative.  Negative for apnea.   Cardiovascular: Negative.   Gastrointestinal: Positive for nausea, abdominal pain and anorexia (worse past two days). Negative for hematochezia and flatus.  Genitourinary: Negative for frequency.  Neurological: Negative for headaches.   Does have a history of kidney stones. She had a pregnancy test in the ER yesterday and urine which both look good. Labs and tests of patient from the ER yesterday were all reviewed.    Objective:   Physical Exam  Constitutional: She appears well-developed and well-nourished.  HENT:  Head: Normocephalic.  Neck: Normal range of motion.  Cardiovascular: Normal rate and regular rhythm.   Pulmonary/Chest: Effort normal and breath sounds normal. No respiratory distress. She has no wheezes.  Abdominal: There is tenderness. There is no guarding.  Genitourinary:  And exam she has slight discharge she does have positive cervical motion tenderness          Assessment & Plan:  Pelvic inflammatory disease (PID) - Plan: cefTRIAXone (ROCEPHIN) injection 500 mg No orders of the  defined types were placed in this encounter.   Patient was told her she ought to gradually get better over the next few days hydrocodone for pain she is to call us if any particular problems or worries. Use doxycycline twice a day as directed.  It should be noted that review of her ER records and previous scans was done with the presence of the patient. It was also discussed with her how PID can sometimes turn into an abscess and that if she's not seen significant improvement over the course of the next several days she is to let us know she is to take her antibiotics and her pain medicine as prescribed. If this severe or abdominal symptoms high fevers or worse immediately followup with [place] or go to the ER. Significant complexity plus significant time spent with patient 25 minutes [id number]

## 2012-04-03 NOTE — Patient Instructions (Addendum)
Use your here meds. If high fever or worse follow up here or er Pelvic Inflammatory Disease Pelvic inflammatory disease (PID) refers to an infection in some or all of the female organs. The infection can be in the uterus, ovaries, fallopian tubes, or the surrounding tissues in the pelvis. PID can cause abdominal or pelvic pain that comes on suddenly (acute pelvic pain). PID is a serious infection because it can lead to lasting (chronic) pelvic pain or the inability to have children (infertile).  CAUSES  The infection is often caused by the normal bacteria found in the vaginal tissues. PID may also be caused by an infection that is spread during sexual contact. PID can also occur following:   The birth of a baby.   A miscarriage.   An abortion.   Major pelvic surgery.   The use of an intrauterine device (IUD).   A sexual assault.  RISK FACTORS Certain factors can put a person at higher risk for PID, such as:  Being younger than 25 years.  Being sexually active at [place] age.  Usingnonbarrier contraception.  Havingmultiple sexual partners.  Having sex with someone who has symptoms of a genital infection.  Using oral contraception. Other times, certain behaviors can increase the possibility of getting PID, such as:  Having sex during your period.  Using a vaginal douche.  Having an intrauterine device (IUD) in place. SYMPTOMS   Abdominal or pelvic pain.   Fever.   Chills.   Abnormal vaginal discharge.  Abnormal uterine bleeding.   Unusual pain shortly after finishing your period. DIAGNOSIS  Your caregiver will choose some of the following methods to make a diagnosis, such as:   Performinga physical exam and history. A pelvic exam typically reveals a very tender uterus and surrounding pelvis.   Ordering laboratory tests including a pregnancy test, blood tests, and urine test.  Orderingcultures of the vagina and cervix to check for a sexually  transmitted infection (STI).  Performing an ultrasound.   Performing a laparoscopic procedure to look inside the pelvis.  TREATMENT   Antibiotic medicines may be prescribed and taken by mouth.   Sexual partners may be treated when the infection is caused by a sexually transmitted disease (STD).   Hospitalization may be needed to give antibiotics intravenously.  Surgery may be needed, but this is rare. It may take weeks until you are completely well. If you are diagnosed with PID, you should also be checked for human immunodeficiency virus (HIV). HOME CARE INSTRUCTIONS   If given, take your antibiotics as directed. Finish the medicine even if you start to feel better.   Only take over-the-counter or prescription medicines for pain, discomfort, or fever as directed by your caregiver.   Do not have sexual intercourse until treatment is completed or as directed by your caregiver. If PID is confirmed, your recent sexual partner(s) will need treatment.   Keep your follow-up appointments. SEEK MEDICAL CARE IF:   You have increased or abnormal vaginal discharge.   You need prescription medicine for your pain.   You vomit.   You cannot take your medicines.   Your partner has an STD.  SEEK IMMEDIATE MEDICAL CARE IF:   You have a fever.   You have increased abdominal or pelvic pain.   You have chills.   You have pain when you urinate.   You are not better after 72 hours following treatment.  MAKE SURE YOU:   Understand these instructions.  Will watch your condition.  Will get help right away if you are not doing well or get worse. Document Released: 12/31/2004 Document Revised: 03/25/2011 Document Reviewed: 12/27/2010 [facility] Patient Information 2013 [place], [place].

## 2012-04-03 NOTE — ED Notes (Signed)
Patient vomited majority of first bottle of contrast. Advised MD. Order for zofran 4mg  IV.

## 2012-04-03 NOTE — ED Provider Notes (Signed)
1610 Assumed care/disposition of patient who awaits CT to r/o appendectomy vs kidney stone. 201 CT has returned. She has a kidney stone which is not ;moving. Appendix is okay. Reviewed results with patient.  Ct Abdomen Pelvis W Contrast  04/03/2012  *RADIOLOGY REPORT*  Clinical Data: Right lower quadrant abdominal pain.  CT ABDOMEN AND PELVIS WITH CONTRAST  Technique:  Multidetector CT imaging of the abdomen and pelvis was performed following the standard protocol during bolus administration of intravenous contrast.  Contrast: 50mL OMNIPAQUE IOHEXOL 300 MG/ML  SOLN, OMNIPAQUE IOHEXOL 300 MG/ML  SOLN  Comparison: 10/18/2011  Findings: The lung bases are clear.  Small accessory spleen.  The liver, spleen, gallbladder, pancreas, adrenal glands, abdominal aorta, and retroperitoneal lymph nodes are unremarkable.  There is a 3 mm stone in the midportion of the left kidney without evidence of obstruction.  This is stable since previous study.  No hydronephrosis in either kidney.  Inferior vena cava appears intact.  No free air or free fluid in the abdomen. The stomach, small bowel, and colon are unremarkable.  Broad-based umbilical hernia containing fat, stable since previous study.  Pelvis:  The uterus and ovaries are not enlarged.  Bladder wall is not thickened.  No free or loculated pelvic fluid collections. Diverticula in the sigmoid colon without diverticulitis.  No significant pelvic lymphadenopathy.  Scattered mesenteric lymph nodes are not pathologically enlarged.  The appendix is normal. Normal alignment of the lumbar vertebrae.  IMPRESSION: No acute process demonstrated in the abdomen or pelvis.  Stable nonobstructing stone in the left kidney.   Original Report Authenticated By: Burman Nieves, M.D.     Patient to be discharged home.  Pt feels improved after observation and/or treatment in ED.Pt stable in ED with no significant deterioration in condition.The patient appears reasonably screened  and/or stabilized for discharge and I doubt any other medical condition or other [facility] requiring further screening, evaluation, or treatment in the ED at this time prior to discharge.  Nicoletta Dress. Colon Branch, MD 04/03/12 [id number]

## 2012-04-04 LAB — URINE CULTURE: Colony Count: 80000

## 2012-05-21 ENCOUNTER — Telehealth: Payer: Self-pay | Admitting: Family Medicine

## 2012-05-21 NOTE — Telephone Encounter (Signed)
Patient was put on penicillin by her dentist and it is causing her stomach pains .Marland Kitchen She would like to speak to a nurse about this.

## 2012-05-21 NOTE — Telephone Encounter (Signed)
Stomach cramps, and low back pain with PCN prescribed by Dr. Delford Field (her dentist).  Advised to pt to call her dentist with these symptoms.

## 2012-06-02 ENCOUNTER — Encounter (HOSPITAL_COMMUNITY): Payer: Self-pay | Admitting: *Deleted

## 2012-06-02 ENCOUNTER — Emergency Department (HOSPITAL_COMMUNITY)
Admission: EM | Admit: 2012-06-02 | Discharge: 2012-06-02 | Disposition: A | Discharge status: Home / Self Care | Payer: BC Managed Care – PPO | Attending: Emergency Medicine | Admitting: Emergency Medicine

## 2012-06-02 ENCOUNTER — Emergency Department (HOSPITAL_COMMUNITY): Payer: BC Managed Care – PPO

## 2012-06-02 DIAGNOSIS — Z87442 Personal history of urinary calculi: Secondary | ICD-10-CM | POA: Insufficient documentation

## 2012-06-02 DIAGNOSIS — S93409A Sprain of unspecified ligament of unspecified ankle, initial encounter: Secondary | ICD-10-CM | POA: Insufficient documentation

## 2012-06-02 DIAGNOSIS — Y929 Unspecified place or not applicable: Secondary | ICD-10-CM | POA: Insufficient documentation

## 2012-06-02 DIAGNOSIS — Z8719 Personal history of other diseases of the digestive system: Secondary | ICD-10-CM | POA: Insufficient documentation

## 2012-06-02 DIAGNOSIS — G8929 Other chronic pain: Secondary | ICD-10-CM | POA: Insufficient documentation

## 2012-06-02 DIAGNOSIS — Y9389 Activity, other specified: Secondary | ICD-10-CM | POA: Insufficient documentation

## 2012-06-02 DIAGNOSIS — F17200 Nicotine dependence, unspecified, uncomplicated: Secondary | ICD-10-CM | POA: Insufficient documentation

## 2012-06-02 DIAGNOSIS — X500XXA Overexertion from strenuous movement or load, initial encounter: Secondary | ICD-10-CM | POA: Insufficient documentation

## 2012-06-02 DIAGNOSIS — S93402A Sprain of unspecified ligament of left ankle, initial encounter: Secondary | ICD-10-CM

## 2012-06-02 DIAGNOSIS — J45909 Unspecified asthma, uncomplicated: Secondary | ICD-10-CM | POA: Insufficient documentation

## 2012-06-02 DIAGNOSIS — Z8659 Personal history of other mental and behavioral disorders: Secondary | ICD-10-CM | POA: Insufficient documentation

## 2012-06-02 DIAGNOSIS — R296 Repeated falls: Secondary | ICD-10-CM | POA: Insufficient documentation

## 2012-06-02 DIAGNOSIS — Z8739 Personal history of other diseases of the musculoskeletal system and connective tissue: Secondary | ICD-10-CM | POA: Insufficient documentation

## 2012-06-02 MED ORDER — IBUPROFEN 800 MG PO TABS: 800.0000 mg | ORAL_TABLET | Freq: Three times a day (TID) | ORAL | Status: DC

## 2012-06-02 MED ORDER — IBUPROFEN 800 MG PO TABS
800.0000 mg | ORAL_TABLET | Freq: Once | ORAL | Status: AC
  Administered 2012-06-02: 800 mg via ORAL
  Filled 2012-06-02: qty 1

## 2012-06-02 MED ORDER — HYDROCODONE-ACETAMINOPHEN 5-325 MG PO TABS: 1.0000 | ORAL_TABLET | ORAL | Status: DC | PRN

## 2012-06-02 MED ORDER — HYDROCODONE-ACETAMINOPHEN 5-325 MG PO TABS
1.0000 | ORAL_TABLET | Freq: Once | ORAL | Status: AC
  Administered 2012-06-02: 1 via ORAL
  Filled 2012-06-02: qty 1

## 2012-06-02 NOTE — ED Notes (Signed)
Pt alert & oriented x4, stable gait. Patient given discharge instructions, paperwork & prescription(s). Patient  instructed to stop at the registration desk to finish any additional paperwork. Patient verbalized understanding. Pt left department w/ no further questions. 

## 2012-06-02 NOTE — ED Notes (Signed)
Ankle elevated and ice pack applied

## 2012-06-02 NOTE — ED Notes (Signed)
Pt reports twisting left ankle and falling.  States "I felt it crack and I went down."

## 2012-06-02 NOTE — ED Provider Notes (Signed)
History     CSN: [id number]  Arrival date & time 06/02/12  0244   First MD Initiated Contact with Patient 06/02/12 0253      Chief Complaint  Patient presents with  . Fall  . Ankle Injury    (Consider location/radiation/quality/duration/timing/severity/associated sxs/prior treatment) HPI HPI Comments: [name] [name] is a 22 y.o. female who presents to the Emergency Department complaining of left ankle pain after her foot fell asleep while she was sitting on the couch and she stood up, falling to the left. She heard a pop when she fell. Her fried wrapped the ankle. She has taken no medicines.   PCP Dr. Gerda Diss  Past Medical History  Diagnosis Date  . Fibromyalgia   . Asthma   . Syringomyelia     thoracic spine  . GERD (gastroesophageal reflux disease)   . Chronic pain     Dr. Murray Hodgkins in [place]  . Chronic back pain   . DDD (degenerative disc disease)   . Scoliosis   . Bulging disc   . Spinal bifida, closed   . Depression   . Renal disorder     kidney stones  . Kidney stone     Past Surgical History  Procedure Laterality Date  . Renal lithiasis removed    . Esophagogastroduodenoscopy  01/2010    circumferential distal esophageal erosions, hiatal hernia, excoriations/erosion in gastric body ?trauma from vomiting, bx negative and showed chronic gastritis  . Colonoscopy  01/2010    scattered petechiae and fibrotic-appearing mucosa of uncertain significance , ?resolving infection, bx benign  . Breath hydrogen test  08/08/2010       . Hydrogen breath test  08/06/2010    Procedure: HYDROGEN BREATH TEST;  Surgeon: Corbin Ade, MD;  Location: [facility];  Service: Gastroenterology;  Laterality: N/A;  . Cystoscopy w/ ureteral stent placement  05/28/2011    Procedure: CYSTOSCOPY WITH RETROGRADE PYELOGRAM/URETERAL STENT PLACEMENT;  Surgeon: Ky Barban, MD;  Location: [facility];  Service: Urology;  Laterality: Right;  Cystoscopy with Right Retrograde Pyelogram  . Stone extraction with  basket  05/28/2011    Procedure: STONE EXTRACTION WITH BASKET;  Surgeon: Ky Barban, MD;  Location: [facility];  Service: Urology;  Laterality: Right;  . Cystoscopy with urethral dilatation  05/28/2011    Procedure: CYSTOSCOPY WITH URETHRAL DILATATION;  Surgeon: Ky Barban, MD;  Location: [facility];  Service: Urology;  Laterality: N/A;    Family History  Problem Relation Age of Onset  . Multiple sclerosis Father   . Cervical cancer Mother   . Anesthesia problems Neg Hx   . Hypotension Neg Hx   . Malignant hyperthermia Neg Hx   . Pseudochol deficiency Neg Hx   . Heart disease    . Lung disease    . Asthma    . Diabetes    . Kidney disease      History  Substance Use Topics  . Smoking status: Current Every Day Smoker -- 0.50 packs/day for 10 years    Types: Cigarettes  . Smokeless tobacco: Never Used  . Alcohol Use: 0.0 oz/week     Comment: rare    OB History   Grav Para Term Preterm Abortions TAB SAB Ect Mult Living   1 1 1       1       Review of Systems  Constitutional: Negative for fever.       10 Systems reviewed and are negative for acute change except as noted  in the HPI.  HENT: Negative for congestion.   Eyes: Negative for discharge and redness.  Respiratory: Negative for cough and shortness of breath.   Cardiovascular: Negative for chest pain.  Gastrointestinal: Negative for vomiting and abdominal pain.  Musculoskeletal: Negative for back pain.       Ankle pain  Skin: Negative for rash.  Neurological: Negative for syncope, numbness and headaches.  Psychiatric/Behavioral:       No behavior change.    Allergies  Sulfa antibiotics and Sulfonamide derivatives  Home Medications   Current Outpatient Rx  Name  Route  Sig  Dispense  Refill  . doxycycline (VIBRAMYCIN) 100 MG capsule   Oral   Take 1 capsule (100 mg total) by mouth 2 (two) times daily.   20 capsule   0   . HYDROcodone-acetaminophen (NORCO/VICODIN) 5-325 MG per tablet   Oral   Take 1  tablet by mouth every 4 (four) hours as needed for pain.   40 tablet   1   . ondansetron (ZOFRAN ODT) 4 MG disintegrating tablet   Oral   Take 1 tablet (4 mg total) by mouth every 8 (eight) hours as needed for nausea.   20 tablet   0     BP 157/89  Pulse 122  Temp(Src) 97.8 F (36.6 C) (Oral)  Resp 20  Ht 5' (1.524 m)  Wt 277 lb (125.646 kg)  BMI 54.1 kg/m2  SpO2 96%  LMP 05/19/2012  Physical Exam  Nursing note and vitals reviewed. Constitutional: She appears well-developed and well-nourished.  Awake, alert, nontoxic appearance.  HENT:  Head: Atraumatic.  Eyes: EOM are normal. Pupils are equal, round, and reactive to light.  Neck: Normal range of motion. Neck supple.  Cardiovascular: Normal rate and intact distal pulses.   Pulmonary/Chest: Effort normal and breath sounds normal. She exhibits no tenderness.  Abdominal: Soft. Bowel sounds are normal. There is no tenderness. There is no rebound.  Musculoskeletal: She exhibits no tenderness.  Baseline ROM, no obvious new focal weakness.Left lateral ankle with swelling, no deformity. Painful to palpation over the lateral malleolar area. Pulses 2+  Neurological:  Mental status and motor strength appears baseline for patient and situation.  Skin: No rash noted.  Psychiatric: She has a normal mood and affect.    ED Course  Procedures (including critical care time)  Labs Reviewed - No data to display Dg Ankle Complete Left  06/02/2012   *RADIOLOGY REPORT*  Clinical Data: Left ankle pain.  LEFT ANKLE COMPLETE - 3+ VIEW  Comparison: None  Findings: The lateral ankle mortise is slightly widened.  No acute ankle fracture or osteochondral lesion.  IMPRESSION: Mild lateral ankle mortise widening but no acute ankle fracture.   Original Report Authenticated By: Rudie Meyer, M.D.        MDM  Patient with pain to the left ankle after falling. Xray does not show a fracture. Swelling to the ankle support an ankle sprain. Given  ibuprofen and hydrocodone. Placed in an ASO and on crutches. Reviewed results with patient. Pt stable in ED with no significant deterioration in condition.The patient appears reasonably screened and/or stabilized for discharge and I doubt any other medical condition or other [facility] requiring further screening, evaluation, or treatment in the ED at this time prior to discharge.  MDM Reviewed: nursing note and vitals Interpretation: x-ray           Nicoletta Dress. Colon Branch, MD 06/02/12 [phone]

## 2012-06-11 ENCOUNTER — Ambulatory Visit (INDEPENDENT_AMBULATORY_CARE_PROVIDER_SITE_OTHER): Payer: BC Managed Care – PPO | Admitting: Nurse Practitioner

## 2012-06-11 ENCOUNTER — Encounter: Payer: Self-pay | Admitting: Nurse Practitioner

## 2012-06-11 VITALS — BP 118/74 | HR 70

## 2012-06-11 DIAGNOSIS — Z5189 Encounter for other specified aftercare: Secondary | ICD-10-CM

## 2012-06-11 DIAGNOSIS — S93402D Sprain of unspecified ligament of left ankle, subsequent encounter: Secondary | ICD-10-CM

## 2012-06-11 MED ORDER — HYDROCODONE-ACETAMINOPHEN 5-325 MG PO TABS: 1.0000 | ORAL_TABLET | ORAL | Status: DC | PRN
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
## 2012-06-18 ENCOUNTER — Telehealth: Payer: Self-pay | Admitting: Family Medicine

## 2012-06-18 MED ORDER — HYDROCODONE-ACETAMINOPHEN 5-325 MG PO TABS: 1.0000 | ORAL_TABLET | Freq: Four times a day (QID) | ORAL | Status: DC | PRN

## 2012-06-18 NOTE — Telephone Encounter (Signed)
Patient says that ortho doctor that she saw for her ankle where we referred her told her that he would rather [place] prescribe patient the pain medication she needs. Please advise. Patient is in need of her pain medication now.

## 2012-06-18 NOTE — Telephone Encounter (Signed)
Patient got #30 Hydrocodone on 06/11/12

## 2012-06-18 NOTE — Telephone Encounter (Signed)
Rx given to patient. Patient advised further pain meds will require office visit here or should be taken over by specialist.

## 2012-06-18 NOTE — Telephone Encounter (Signed)
May have #21 of hydrocodone 5/325. One TID  PRN may fill tommorow. Not for long term use. Follow up if ongoing need for hydrocodone.

## 2012-09-17 ENCOUNTER — Encounter: Payer: Self-pay | Admitting: Family Medicine

## 2012-09-17 ENCOUNTER — Ambulatory Visit (INDEPENDENT_AMBULATORY_CARE_PROVIDER_SITE_OTHER): Payer: BC Managed Care – PPO | Admitting: Family Medicine

## 2012-09-17 VITALS — BP 138/70 | Temp 98.5°F | Ht 60.0 in | Wt 285.0 lb

## 2012-09-17 DIAGNOSIS — R109 Unspecified abdominal pain: Secondary | ICD-10-CM

## 2012-09-17 DIAGNOSIS — R81 Glycosuria: Secondary | ICD-10-CM

## 2012-09-17 LAB — POCT URINALYSIS DIPSTICK: pH, UA: 7

## 2012-09-17 LAB — GLUCOSE, POCT (MANUAL RESULT ENTRY): POC Glucose: 103 mg/dl — AB (ref 70–99)

## 2012-09-17 MED ORDER — CIPROFLOXACIN HCL 500 MG PO TABS: 500.0000 mg | ORAL_TABLET | Freq: Two times a day (BID) | ORAL | Status: AC

## 2012-09-17 MED ORDER — HYDROCODONE-ACETAMINOPHEN 10-325 MG PO TABS: 1.0000 | ORAL_TABLET | Freq: Four times a day (QID) | ORAL | Status: DC | PRN

## 2012-09-17 MED ORDER — ONDANSETRON HCL 8 MG PO TABS: 8.0000 mg | ORAL_TABLET | Freq: Three times a day (TID) | ORAL | Status: DC | PRN

## 2012-09-17 NOTE — Progress Notes (Signed)
  Subjective:    Patient ID: [name] [name], female    DOB: [birth year], 22 y.o.   MRN: [id number]  Abdominal Pain This is a new problem. The current episode started in the past 7 days. Pain location: right side. The quality of the pain is sharp. Associated symptoms include nausea and vomiting. Associated symptoms comments: chills. She has tried acetaminophen for the symptoms. The treatment provided no relief.   Glucose (50)  showed up on urine dipstick. BS 103 pt ate a biscuit about 6.5 hours ago.   Review of Systems  Gastrointestinal: Positive for nausea, vomiting and abdominal pain.   patient has extensive history of kidney stones. She denies any midabdomen pain she does relate vomiting right flank pain     Objective:   Physical Exam Lungs are clear heart is regular patient obese mild tenderness in the right flank right upper quadrant nontender midabdomen nontender  Urinalysis with some wbc's no rbc's     Assessment & Plan:  Abdominal pain-more than likely this is a renal infection I recommend Cipro I also recommend Zofran for nausea in addition to this also recommend that the patient taken plenty of liquids if not improving over the next several days may need a ultrasound of the abdomen possibly HIDA test. I don't recommend CT scan she has had numerous CT scans my concern is that would increase her risk of cancer. Currently right now benefits do not outweigh the risks. Patient understands this will let us know if ongoing troubles

## 2012-09-18 ENCOUNTER — Telehealth: Payer: Self-pay | Admitting: Family Medicine

## 2012-09-18 MED ORDER — PROMETHAZINE HCL 25 MG PO TABS: 25.0000 mg | ORAL_TABLET | Freq: Four times a day (QID) | ORAL | Status: DC | PRN

## 2012-09-18 NOTE — Telephone Encounter (Signed)
Rx sent electronically to [facility]. Patient notified.

## 2012-09-18 NOTE — Telephone Encounter (Signed)
Phen 25 one q4-6 prn

## 2012-09-18 NOTE — Telephone Encounter (Signed)
Patient was seen yesterday and given nausea medicine but making her head hurt. Can you call in  something else to [facility].

## 2012-09-18 NOTE — Addendum Note (Signed)
Addended by: Margaretha Sheffield on: 09/18/2012 06:14 PM   Modules accepted: Orders

## 2012-10-06 ENCOUNTER — Other Ambulatory Visit: Payer: Self-pay

## 2012-10-06 ENCOUNTER — Telehealth: Payer: Self-pay | Admitting: Family Medicine

## 2012-10-06 DIAGNOSIS — R109 Unspecified abdominal pain: Secondary | ICD-10-CM

## 2012-10-06 NOTE — Telephone Encounter (Signed)
Nurse to please call patient-if the symptoms are on the right side then please set up ultrasound of the abdomen with followup visit 2-4 days later to discuss

## 2012-10-06 NOTE — Telephone Encounter (Signed)
States she is still having hurting in her side and back.  States Dr. Lorin Picket informed her to let him know if she was still having issues and he would send her for [facility] Bladder testing.  Please call Patient. Thanks

## 2012-10-06 NOTE — Telephone Encounter (Signed)
Ultrasound scheduled for 10/13/12 at 8:45 am. Patient was notified and given number to radiology in case she has to call and reschedule due to her work schedule.

## 2012-10-09 ENCOUNTER — Ambulatory Visit (HOSPITAL_COMMUNITY)
Admission: RE | Admit: 2012-10-09 | Discharge: 2012-10-09 | Disposition: A | Discharge status: Home / Self Care | Payer: BC Managed Care – PPO | Source: Ambulatory Visit | Attending: Family Medicine | Admitting: Family Medicine

## 2012-10-09 DIAGNOSIS — N200 Calculus of kidney: Secondary | ICD-10-CM | POA: Insufficient documentation

## 2012-10-09 DIAGNOSIS — R109 Unspecified abdominal pain: Secondary | ICD-10-CM | POA: Insufficient documentation

## 2012-10-13 ENCOUNTER — Ambulatory Visit (HOSPITAL_COMMUNITY): Payer: BC Managed Care – PPO

## 2013-01-18 ENCOUNTER — Telehealth: Payer: Self-pay | Admitting: Family Medicine

## 2013-01-18 NOTE — Telephone Encounter (Signed)
Patient notified and transferred up front for appt. I also sent in request for medical records to be released [facility] (1/5@1140 )

## 2013-01-18 NOTE — Telephone Encounter (Signed)
Patient went [facility] last [facility] for [facility]adder pain and they suggested she call today to have a referral set up for her to have gallbladder removed.

## 2013-01-18 NOTE — Telephone Encounter (Signed)
She will need OV in am then we can do proper referal. plz get er notes and any test sent to us before we see her

## 2013-01-19 ENCOUNTER — Encounter: Payer: Self-pay | Admitting: Family Medicine

## 2013-01-19 ENCOUNTER — Ambulatory Visit (INDEPENDENT_AMBULATORY_CARE_PROVIDER_SITE_OTHER): Payer: BC Managed Care – PPO | Admitting: Family Medicine

## 2013-01-19 VITALS — BP 124/80 | Temp 98.6°F | Ht 60.5 in | Wt 274.0 lb

## 2013-01-19 DIAGNOSIS — R109 Unspecified abdominal pain: Secondary | ICD-10-CM

## 2013-01-19 MED ORDER — PANTOPRAZOLE SODIUM 40 MG PO TBEC: 40.0000 mg | DELAYED_RELEASE_TABLET | Freq: Every day | ORAL | Status: DC

## 2013-01-19 NOTE — Progress Notes (Signed)
   Subjective:    Patient ID: [name] JeffersonAmber L [name], female    DOB: 1990-11-09, 23 y.o.   MRN: [id number]  HPIPain between shoulders and nausea. Started last Saturday. [facility]. Prescribed zofran and an antibiotic for UTI.  Currently taking medication tolerating it well. States she feels better. Relates epigastric pain as well as pain between the shoulder blades when she eats. She went to the ER they told her that more likely gallbladder and told her she needed to have her gallbladder out she has had ultrasound the past which had a normal gallbladder but has not had a HIDA in 4 years.   Review of Systems Patient relates abdominal pain more in the epigastrium right upper quadrant also relates some nausea intermittent vomiting but none currently no diarrhea no sweats or chills    Objective:   Physical Exam Lungs are clear hearts regular abdomen mild epigastric and right upper quadrant tenderness no guarding or rebound no rashes       Assessment & Plan:  Right upper quadrant and epigastric pain start PPI. Also HIDA test. Await the results. Referral to gastroenterology. More than likely will need EGD. Certainly if HIDA abnormal referral to surgeon

## 2013-01-21 ENCOUNTER — Encounter (HOSPITAL_COMMUNITY)
Admission: RE | Admit: 2013-01-21 | Discharge: 2013-01-21 | Disposition: A | Discharge status: Home / Self Care | Payer: BC Managed Care – PPO | Source: Ambulatory Visit | Attending: Family Medicine | Admitting: Family Medicine

## 2013-01-21 ENCOUNTER — Encounter (HOSPITAL_COMMUNITY): Payer: Self-pay

## 2013-01-21 DIAGNOSIS — R109 Unspecified abdominal pain: Secondary | ICD-10-CM | POA: Insufficient documentation

## 2013-01-21 MED ORDER — TECHNETIUM TC 99M MEBROFENIN IV KIT
5.0000 | PACK | Freq: Once | INTRAVENOUS | Status: AC | PRN
  Administered 2013-01-21: 5 via INTRAVENOUS

## 2013-01-21 MED ORDER — STERILE WATER FOR INJECTION IJ SOLN
INTRAMUSCULAR | Status: AC
  Filled 2013-01-21: qty 10

## 2013-01-21 MED ORDER — SINCALIDE 5 MCG IJ SOLR
INTRAMUSCULAR | Status: AC
  Filled 2013-01-21: qty 5

## 2013-01-22 ENCOUNTER — Emergency Department (HOSPITAL_COMMUNITY)
Admission: EM | Admit: 2013-01-22 | Discharge: 2013-01-22 | Disposition: A | Discharge status: Home / Self Care | Payer: BC Managed Care – PPO | Attending: Emergency Medicine | Admitting: Emergency Medicine

## 2013-01-22 ENCOUNTER — Encounter (HOSPITAL_COMMUNITY): Payer: Self-pay | Admitting: Emergency Medicine

## 2013-01-22 DIAGNOSIS — G8929 Other chronic pain: Secondary | ICD-10-CM | POA: Insufficient documentation

## 2013-01-22 DIAGNOSIS — Z79899 Other long term (current) drug therapy: Secondary | ICD-10-CM | POA: Insufficient documentation

## 2013-01-22 DIAGNOSIS — Z87442 Personal history of urinary calculi: Secondary | ICD-10-CM | POA: Insufficient documentation

## 2013-01-22 DIAGNOSIS — Z8659 Personal history of other mental and behavioral disorders: Secondary | ICD-10-CM | POA: Insufficient documentation

## 2013-01-22 DIAGNOSIS — F17200 Nicotine dependence, unspecified, uncomplicated: Secondary | ICD-10-CM | POA: Insufficient documentation

## 2013-01-22 DIAGNOSIS — Z3202 Encounter for pregnancy test, result negative: Secondary | ICD-10-CM | POA: Insufficient documentation

## 2013-01-22 DIAGNOSIS — Z880 Allergy status to penicillin: Secondary | ICD-10-CM | POA: Insufficient documentation

## 2013-01-22 DIAGNOSIS — J45909 Unspecified asthma, uncomplicated: Secondary | ICD-10-CM | POA: Insufficient documentation

## 2013-01-22 DIAGNOSIS — Z792 Long term (current) use of antibiotics: Secondary | ICD-10-CM | POA: Insufficient documentation

## 2013-01-22 DIAGNOSIS — K219 Gastro-esophageal reflux disease without esophagitis: Secondary | ICD-10-CM | POA: Insufficient documentation

## 2013-01-22 DIAGNOSIS — Z87728 Personal history of other specified (corrected) congenital malformations of nervous system and sense organs: Secondary | ICD-10-CM | POA: Insufficient documentation

## 2013-01-22 DIAGNOSIS — R109 Unspecified abdominal pain: Secondary | ICD-10-CM

## 2013-01-22 DIAGNOSIS — R1011 Right upper quadrant pain: Secondary | ICD-10-CM | POA: Insufficient documentation

## 2013-01-22 DIAGNOSIS — Z8739 Personal history of other diseases of the musculoskeletal system and connective tissue: Secondary | ICD-10-CM | POA: Insufficient documentation

## 2013-01-22 LAB — COMPREHENSIVE METABOLIC PANEL
ALBUMIN: 4.2 g/dL (ref 3.5–5.2)
ALT: 22 U/L (ref 0–35)
AST: 20 U/L (ref 0–37)
Alkaline Phosphatase: 104 U/L (ref 39–117)
BUN: 11 mg/dL (ref 6–23)
CO2: 25 mEq/L (ref 19–32)
Calcium: 9.3 mg/dL (ref 8.4–10.5)
Chloride: 100 mEq/L (ref 96–112)
Creatinine, Ser: 0.86 mg/dL (ref 0.50–1.10)
GFR calc Af Amer: >90 mL/min (ref >90)
GFR calc non Af Amer: >90 mL/min (ref >90)
Glucose, Bld: 97 mg/dL (ref 70–99)
POTASSIUM: 3.7 meq/L (ref 3.7–5.3)
SODIUM: 139 meq/L (ref 137–147)
TOTAL PROTEIN: 7.9 g/dL (ref 6.0–8.3)
Total Bilirubin: 0.4 mg/dL (ref 0.3–1.2)

## 2013-01-22 LAB — CBC WITH DIFFERENTIAL/PLATELET
BASOS PCT: 0 % (ref 0–1)
Basophils Absolute: 0.1 10*3/uL (ref 0.0–0.1)
EOS ABS: 0.4 10*3/uL (ref 0.0–0.7)
Eosinophils Relative: 3 % (ref 0–5)
HCT: 46.2 % — ABNORMAL HIGH (ref 36.0–46.0)
HEMOGLOBIN: 16.3 g/dL — AB (ref 12.0–15.0)
LYMPHS ABS: 2.8 10*3/uL (ref 0.7–4.0)
Lymphocytes Relative: 18 % (ref 12–46)
MCH: 30 pg (ref 26.0–34.0)
MCHC: 35.3 g/dL (ref 30.0–36.0)
MCV: 85.1 fL (ref 78.0–100.0)
Monocytes Absolute: 0.9 10*3/uL (ref 0.1–1.0)
Monocytes Relative: 6 % (ref 3–12)
Neutro Abs: 11 10*3/uL — ABNORMAL HIGH (ref 1.7–7.7)
Neutrophils Relative %: 73 % (ref 43–77)
PLATELETS: 274 10*3/uL (ref 150–400)
RBC: 5.43 MIL/uL — AB (ref 3.87–5.11)
RDW: 12.7 % (ref 11.5–15.5)
WBC: 15.1 10*3/uL — ABNORMAL HIGH (ref 4.0–10.5)

## 2013-01-22 LAB — URINE MICROSCOPIC-ADD ON

## 2013-01-22 LAB — URINALYSIS, ROUTINE W REFLEX MICROSCOPIC
Bilirubin Urine: NEGATIVE
GLUCOSE, UA: NEGATIVE mg/dL
KETONES UR: 15 mg/dL — AB
Nitrite: NEGATIVE
PH: 5.5 (ref 5.0–8.0)
Protein, ur: NEGATIVE mg/dL
Specific Gravity, Urine: >1.03 — ABNORMAL HIGH (ref 1.005–1.030)
Urobilinogen, UA: 0.2 mg/dL (ref 0.0–1.0)

## 2013-01-22 LAB — PREGNANCY, URINE: PREG TEST UR: NEGATIVE

## 2013-01-22 LAB — LIPASE, BLOOD: Lipase: 17 U/L (ref 11–59)

## 2013-01-22 MED ORDER — HYDROGEN PEROXIDE 3 % EX SOLN
CUTANEOUS | Status: AC
  Filled 2013-01-22: qty 473

## 2013-01-22 MED ORDER — SODIUM CHLORIDE 0.9 % IV BOLUS (SEPSIS)
1000.0000 mL | Freq: Once | INTRAVENOUS | Status: AC
  Administered 2013-01-22: 1000 mL via INTRAVENOUS

## 2013-01-22 MED ORDER — PROMETHAZINE HCL 25 MG PO TABS: 25.0000 mg | ORAL_TABLET | Freq: Four times a day (QID) | ORAL | Status: DC | PRN

## 2013-01-22 MED ORDER — MORPHINE SULFATE 4 MG/ML IJ SOLN
4.0000 mg | Freq: Once | INTRAMUSCULAR | Status: AC
  Administered 2013-01-22: 4 mg via INTRAVENOUS
  Filled 2013-01-22: qty 1

## 2013-01-22 MED ORDER — TRAMADOL HCL 50 MG PO TABS: 50.0000 mg | ORAL_TABLET | Freq: Four times a day (QID) | ORAL | Status: DC | PRN

## 2013-01-22 MED ORDER — ONDANSETRON HCL 4 MG/2ML IJ SOLN
4.0000 mg | Freq: Once | INTRAMUSCULAR | Status: AC
  Administered 2013-01-22: 4 mg via INTRAMUSCULAR
  Filled 2013-01-22: qty 2

## 2013-01-22 NOTE — Discharge Instructions (Signed)
Abdominal Pain, Women °Abdominal (stomach, pelvic, or belly) pain can be caused by many things. It is important to tell your doctor: °· The location of the pain. °· Does it come and go or is it present all the time? °· Are there things that start the pain (eating certain foods, exercise)? °· Are there other symptoms associated with the pain (fever, nausea, vomiting, diarrhea)? °All of this is helpful to know when trying to find the cause of the pain. °CAUSES  °· Stomach: virus or bacteria infection, or ulcer. °· Intestine: appendicitis (inflamed appendix), regional ileitis (Crohn's disease), ulcerative colitis (inflamed colon), irritable bowel syndrome, diverticulitis (inflamed diverticulum of the colon), or cancer of the stomach or intestine. °· Gallbladder disease or stones in the gallbladder. °· Kidney disease, kidney stones, or infection. °· Pancreas infection or cancer. °· Fibromyalgia (pain disorder). °· Diseases of the female organs: °· Uterus: fibroid (non-cancerous) tumors or infection. °· Fallopian tubes: infection or tubal pregnancy. °· Ovary: cysts or tumors. °· Pelvic adhesions (scar tissue). °· Endometriosis (uterus lining tissue growing in the pelvis and on the pelvic organs). °· Pelvic congestion syndrome (female organs filling up with blood just before the menstrual period). °· Pain with the menstrual period. °· Pain with ovulation (producing an egg). °· Pain with an IUD (intrauterine device, birth control) in the uterus. °· Cancer of the female organs. °· Functional pain (pain not caused by a disease, may improve without treatment). °· Psychological pain. °· Depression. °DIAGNOSIS  °Your doctor will decide the seriousness of your pain by doing an examination. °· Blood tests. °· X-rays. °· Ultrasound. °· CT scan (computed tomography, special type of X-ray). °· MRI (magnetic resonance imaging). °· Cultures, for infection. °· Barium enema (dye inserted in the large intestine, to better view it with  X-rays). °· Colonoscopy (looking in intestine with a lighted tube). °· Laparoscopy (minor surgery, looking in abdomen with a lighted tube). °· Major abdominal exploratory surgery (looking in abdomen with a large incision). °TREATMENT  °The treatment will depend on the cause of the pain.  °· Many cases can be observed and treated at home. °· Over-the-counter medicines recommended by your caregiver. °· Prescription medicine. °· Antibiotics, for infection. °· Birth control pills, for painful periods or for ovulation pain. °· Hormone treatment, for endometriosis. °· Nerve blocking injections. °· Physical therapy. °· Antidepressants. °· Counseling with a psychologist or psychiatrist. °· Minor or major surgery. °HOME CARE INSTRUCTIONS  °· Do not take laxatives, unless directed by your caregiver. °· Take over-the-counter pain medicine only if ordered by your caregiver. Do not take aspirin because it can cause an upset stomach or bleeding. °· Try a clear liquid diet (broth or water) as ordered by your caregiver. Slowly move to a bland diet, as tolerated, if the pain is related to the stomach or intestine. °· Have a thermometer and take your temperature several times a day, and record it. °· Bed rest and sleep, if it helps the pain. °· Avoid sexual intercourse, if it causes pain. °· Avoid stressful situations. °· Keep your follow-up appointments and tests, as your caregiver orders. °· If the pain does not go away with medicine or surgery, you may try: °· Acupuncture. °· Relaxation exercises (yoga, meditation). °· Group therapy. °· Counseling. °SEEK MEDICAL CARE IF:  °· You notice certain foods cause stomach pain. °· Your home care treatment is not helping your pain. °· You need stronger pain medicine. °· You want your IUD removed. °· You feel faint or   lightheaded. °· You develop nausea and vomiting. °· You develop a rash. °· You are having side effects or an allergy to your medicine. °SEEK IMMEDIATE MEDICAL CARE IF:  °· Your  pain does not go away or gets worse. °· You have a fever. °· Your pain is felt only in portions of the abdomen. The right side could possibly be appendicitis. The left lower portion of the abdomen could be colitis or diverticulitis. °· You are passing blood in your stools (bright red or black tarry stools, with or without vomiting). °· You have blood in your urine. °· You develop chills, with or without a fever. °· You pass out. °MAKE SURE YOU:  °· Understand these instructions. °· Will watch your condition. °· Will get help right away if you are not doing well or get worse. °Document Released: 10/28/2006 Document Revised: 03/25/2011 Document Reviewed: 11/17/2008 °[employer]® Patient Information ©2014 [employer]. ° °

## 2013-01-22 NOTE — ED Provider Notes (Signed)
CSN: [id number]     Arrival date & time 01/22/13  2032 History   First MD Initiated Contact with Patient 01/22/13 2033     Chief Complaint  Patient presents with  . Abdominal Pain   (Consider location/radiation/quality/duration/timing/severity/associated sxs/prior Treatment) HPI  Patient to the ER with complaints of RUQ abdominal pain and vomiting. The pain started two years ago and is intermittent. It has been acting up over the past couple weeks. She has been seen by a Dr, Benson Norway and at [facility] within the past week. She has had an abdominal ultrasound on Sunday and then just yesterday a HIDA scan, both are normal showing no gallbladder abnormalities. She has been referred to GI but is not going to be seen till Feb 3, her doctor is trying to get the appointment moved up. In the meantime, the patient has Zofran and Motrin for pain. She says that neither are working. She comes to the ER because no one can figure out what is wrong with her. This current episode started at 5 pm this evening and is waxing and waning.   Past Medical History  Diagnosis Date  . Fibromyalgia   . Asthma   . Syringomyelia     thoracic spine  . GERD (gastroesophageal reflux disease)   . Chronic pain     Dr. Murray Hodgkins in [place]  . Chronic back pain   . DDD (degenerative disc disease)   . Scoliosis   . Bulging disc   . Spinal bifida, closed   . Depression   . Renal disorder     kidney stones  . Kidney stone    Past Surgical History  Procedure Laterality Date  . Renal lithiasis removed    . Esophagogastroduodenoscopy  01/2010    circumferential distal esophageal erosions, hiatal hernia, excoriations/erosion in gastric body ?trauma from vomiting, bx negative and showed chronic gastritis  . Colonoscopy  01/2010    scattered petechiae and fibrotic-appearing mucosa of uncertain significance , ?resolving infection, bx benign  . Breath hydrogen test  08/08/2010       . Hydrogen breath test  08/06/2010   Procedure: HYDROGEN BREATH TEST;  Surgeon: Corbin Ade, MD;  Location: [facility];  Service: Gastroenterology;  Laterality: N/A;  . Cystoscopy w/ ureteral stent placement  05/28/2011    Procedure: CYSTOSCOPY WITH RETROGRADE PYELOGRAM/URETERAL STENT PLACEMENT;  Surgeon: Ky Barban, MD;  Location: [facility];  Service: Urology;  Laterality: Right;  Cystoscopy with Right Retrograde Pyelogram  . Stone extraction with basket  05/28/2011    Procedure: STONE EXTRACTION WITH BASKET;  Surgeon: Ky Barban, MD;  Location: [facility];  Service: Urology;  Laterality: Right;  . Cystoscopy with urethral dilatation  05/28/2011    Procedure: CYSTOSCOPY WITH URETHRAL DILATATION;  Surgeon: Ky Barban, MD;  Location: [facility];  Service: Urology;  Laterality: N/A;   Family History  Problem Relation Age of Onset  . Multiple sclerosis Father   . Cervical cancer Mother   . Anesthesia problems Neg Hx   . Hypotension Neg Hx   . Malignant hyperthermia Neg Hx   . Pseudochol deficiency Neg Hx   . Heart disease    . Lung disease    . Asthma    . Diabetes    . Kidney disease     History  Substance Use Topics  . Smoking status: Current Every Day Smoker -- 0.50 packs/day for 10 years    Types: Cigarettes  . Smokeless tobacco: Never Used  .  Alcohol Use: 0.0 oz/week     Comment: rare   OB History   Grav Para Term Preterm Abortions TAB SAB Ect Mult Living   1 1 1       1      Review of Systems The patient denies anorexia, fever, weight loss,, vision loss, decreased hearing, hoarseness, chest pain, syncope, dyspnea on exertion, peripheral edema, balance deficits, hemoptysis, melena, hematochezia, severe indigestion/heartburn, hematuria, incontinence, genital sores, muscle weakness, suspicious skin lesions, transient blindness, difficulty walking, depression, unusual weight change, abnormal bleeding, enlarged lymph nodes, angioedema, and breast masses.  Allergies  Penicillins; Sulfa antibiotics; and  Sulfonamide derivatives  Home Medications   Current Outpatient Rx  Name  Route  Sig  Dispense  Refill  . nitrofurantoin, macrocrystal-monohydrate, (MACROBID) 100 MG capsule   Oral   Take 100 mg by mouth 2 (two) times daily. 10 day course starting on 01/17/2013         . ondansetron (ZOFRAN) 4 MG tablet   Oral   Take 4 mg by mouth every 8 (eight) hours as needed for nausea or vomiting.         . pantoprazole (PROTONIX) 40 MG tablet   Oral   Take 40 mg by mouth at bedtime.          BP 144/72  Pulse 90  Temp(Src) 98.3 F (36.8 C) (Oral)  Resp 18  Ht 5' (1.524 m)  Wt 276 lb (125.193 kg)  BMI 53.90 kg/m2  SpO2 96%  LMP 01/08/2013 Physical Exam  Nursing note and vitals reviewed. Constitutional: She appears well-developed and well-nourished. No distress.  HENT:  Head: Normocephalic and atraumatic.  Eyes: Pupils are equal, round, and reactive to light.  Neck: Normal range of motion. Neck supple.  Cardiovascular: Normal rate and regular rhythm.   Pulmonary/Chest: Effort normal.  Abdominal: Soft. Bowel sounds are normal. There is tenderness in the right upper quadrant. There is no rigidity, no rebound, no guarding, no CVA tenderness and negative Murphy's sign.    Neurological: She is alert.  Skin: Skin is warm and dry.    ED Course  Procedures (including critical care time) Labs Review Labs Reviewed  URINALYSIS, ROUTINE W REFLEX MICROSCOPIC - Abnormal; Notable for the following:    Specific Gravity, Urine >1.030 (*)    Hgb urine dipstick LARGE (*)    Ketones, ur 15 (*)    Leukocytes, UA SMALL (*)    All other components within normal limits  CBC WITH DIFFERENTIAL - Abnormal; Notable for the following:    WBC 15.1 (*)    RBC 5.43 (*)    Hemoglobin 16.3 (*)    HCT 46.2 (*)    Neutro Abs 11.0 (*)    All other components within normal limits  URINE MICROSCOPIC-ADD ON - Abnormal; Notable for the following:    Squamous Epithelial / LPF MANY (*)    Bacteria, UA FEW  (*)    All other components within normal limits  PREGNANCY, URINE  COMPREHENSIVE METABOLIC PANEL  LIPASE, BLOOD   Imaging Review Nm Hepato W/eject Fract  01/21/2013   CLINICAL DATA:  Abdominal pain, nausea  EXAM: NUCLEAR MEDICINE HEPATOBILIARY IMAGING WITH GALLBLADDER EF  TECHNIQUE: Sequential images of the abdomen were obtained out to 60 minutes following intravenous administration of radiopharmaceutical. After slow intravenous infusion of 2.54 micrograms Cholecystokinin, gallbladder ejection fraction was determined.  COMPARISON:  01/25/2004  Correlation:  CT abdomen 04/03/2012  RADIOPHARMACEUTICALS:  5 mCiTc-116m Choletec  FINDINGS: Normal tracer extraction from bloodstream indicating  normal hepatocellular function.  Prompt excretion of tracer into biliary tree.  Gallbladder appears to overlie the CBD on the anterior view, confirmed on prior CT.  Gallbladder visualizes by 12 min.  Small bowel not visualized until following CCK stimulation.  Normal emptying of tracer from the gallbladder recurrence following CCK administration.  Normal gallbladder ejection fraction of 93%.  No symptoms following CCK.  IMPRESSION: Normal exam.   Electronically Signed   By: Ulyses Southward M.D.   On: 01/21/2013 11:30    EKG Interpretation   None       MDM   1. Abdominal pain     Patients pain resolved with 4mg  IV Morphine and 4mg  IV Zofran + fluids.  Her labs are reassuring, with mild signs of dehydration. Lipase, ALT/AST physiologic. Baseline white count  Urinalysis shows that antibiotic is helping, only 5 days left. Patient re-evaluated before DC, continues to have no pain. Will give opiates and phenergan for home.  Pt to see GI.  22 y.o.[name] [name]'s evaluation in the Emergency Department is complete. It has been determined that no acute conditions requiring further emergency intervention are present at this time. The patient/guardian have been advised of the diagnosis and plan. We have discussed signs  and symptoms that warrant return to the ED, such as changes or worsening in symptoms.  Vital signs are stable at discharge. Filed Vitals:   01/22/13 2039  BP: 144/72  Pulse: 90  Temp: 98.3 F (36.8 C)  Resp: 18    Patient/guardian has voiced understanding and agreed to follow-up with the PCP or specialist.    Dorthula Matas, PA-C 01/22/13 2259

## 2013-01-22 NOTE — ED Notes (Signed)
Pain rt abd  Since Saturday, [facility],  Had scan done here yesterday. Cont to have pain and vomiting.

## 2013-01-23 NOTE — ED Provider Notes (Signed)
Medical screening examination/treatment/procedure(s) were performed by non-physician practitioner and as supervising physician I was immediately available for consultation/collaboration.  Annmargaret Decaprio T Barbaraann Avans, MD 01/23/13 1527 

## 2013-01-27 ENCOUNTER — Telehealth: Payer: Self-pay | Admitting: Gastroenterology

## 2013-01-27 NOTE — Telephone Encounter (Signed)
Sounds like patient needs an expedited appointment here;  Let's see if we can't get her in to be seen later this week.

## 2013-01-27 NOTE — Telephone Encounter (Signed)
PT HAVING ABD PAIN. HIDA/U/S WNLs. WOULD LIKE EVALUATION ASAP.

## 2013-01-28 NOTE — Telephone Encounter (Signed)
Pt has OV for 2/3 and nothing is available at this time any sooner unless I can use an URG spot.

## 2013-01-28 NOTE — Telephone Encounter (Signed)
Darl PikesSusan, please schedule asap appt.

## 2013-02-02 NOTE — Telephone Encounter (Signed)
CS offered patient a sooner appointment and patient declined because she has to work

## 2013-02-08 ENCOUNTER — Encounter: Payer: Self-pay | Admitting: Nurse Practitioner

## 2013-02-08 ENCOUNTER — Ambulatory Visit (INDEPENDENT_AMBULATORY_CARE_PROVIDER_SITE_OTHER): Payer: BC Managed Care – PPO | Admitting: Nurse Practitioner

## 2013-02-08 VITALS — BP 116/80 | Ht 61.5 in | Wt 268.2 lb

## 2013-02-08 DIAGNOSIS — Z124 Encounter for screening for malignant neoplasm of cervix: Secondary | ICD-10-CM

## 2013-02-08 DIAGNOSIS — N912 Amenorrhea, unspecified: Secondary | ICD-10-CM

## 2013-02-08 DIAGNOSIS — Z113 Encounter for screening for infections with a predominantly sexual mode of transmission: Secondary | ICD-10-CM

## 2013-02-08 DIAGNOSIS — Z0000 Encounter for general adult medical examination without abnormal findings: Secondary | ICD-10-CM

## 2013-02-08 DIAGNOSIS — Z01419 Encounter for gynecological examination (general) (routine) without abnormal findings: Secondary | ICD-10-CM

## 2013-02-08 DIAGNOSIS — Z3009 Encounter for other general counseling and advice on contraception: Secondary | ICD-10-CM

## 2013-02-08 DIAGNOSIS — N72 Inflammatory disease of cervix uteri: Secondary | ICD-10-CM

## 2013-02-08 LAB — POCT URINE PREGNANCY: Preg Test, Ur: NEGATIVE

## 2013-02-08 MED ORDER — MEDROXYPROGESTERONE ACETATE 150 MG/ML IM SUSP
150.0000 mg | Freq: Once | INTRAMUSCULAR | Status: AC
  Administered 2013-02-08: 150 mg via INTRAMUSCULAR

## 2013-02-09 LAB — PAP IG, CT-NG NAA, HPV HIGH-RISK
CHLAMYDIA PROBE AMP: NEGATIVE
GC Probe Amp: NEGATIVE
HPV DNA HIGH RISK: DETECTED — AB

## 2013-02-12 ENCOUNTER — Encounter: Payer: Self-pay | Admitting: Nurse Practitioner

## 2013-02-12 NOTE — Progress Notes (Signed)
   Subjective:    Patient ID: [name] [name], female    DOB: [birth year], 23 y.o.   MRN: [id number]  HPI Presents for wellness checkup.  Want to restart Depo Provera for birth control. No new sexual partners but in a serious relationship. Last intercourse about one year ago. Cycles usually regular until a couple of weeks ago when she was sick. Usually lasts 4 days. No regular eye or dental exams.    Review of Systems  Constitutional: Negative for activity change, appetite change, fatigue and unexpected weight change.  HENT: Negative for dental problem, ear pain, sinus pressure and sore throat.   Eyes: Negative for visual disturbance.  Respiratory: Negative for cough, chest tightness, shortness of breath and wheezing.   Cardiovascular: Negative for chest pain and leg swelling.  Gastrointestinal: Negative for nausea, vomiting, abdominal pain, diarrhea and constipation.  Genitourinary: Negative for dysuria, urgency, frequency, vaginal discharge, difficulty urinating, genital sores, menstrual problem and pelvic pain.       Objective:   Physical Exam  Vitals reviewed. Constitutional: She is oriented to person, place, and time. She appears well-developed. No distress.  HENT:  Right Ear: External ear normal.  Left Ear: External ear normal.  Mouth/Throat: Oropharynx is clear and moist.  Neck: Normal range of motion. Neck supple. No tracheal deviation present. No thyromegaly present.  Cardiovascular: Normal rate, regular rhythm and normal heart sounds.  Exam reveals no gallop.   No murmur heard. Pulmonary/Chest: Effort normal and breath sounds normal.  Abdominal: Soft. She exhibits no distension. There is no tenderness.  Genitourinary: Vagina normal and uterus normal. No vaginal discharge found.  Musculoskeletal: She exhibits no edema.  Lymphadenopathy:    She has no cervical adenopathy.  Neurological: She is alert and oriented to person, place, and time.  Skin: Skin is warm and dry. No rash  noted.  Psychiatric: She has a normal mood and affect. Her behavior is normal.  Breast exam: no masses; dense tissue; axillae no adenopathy. External GU: no lesions or rash.  Vagina; no discharge. Cervix: mild friability; three small superficial blood filled lesions that ruptured easily with PAP smear. No CMT. Bimanual exam: nontender; no obvious masses; limited due to abd girth.         Assessment & Plan:  Well woman exam  Screening for cervical cancer - Plan: Pap IG, CT/NG NAA, and HPV (high risk)  Screen for STD (sexually transmitted disease) - Plan: Pap IG, CT/NG NAA, and HPV (high risk)  Cervicitis - Plan: Pap IG, CT/NG NAA, and HPV (high risk)  Amenorrhea - Plan: POCT urine pregnancy  Other general counseling and advice for contraceptive management - Plan: medroxyPROGESTERone (DEPO-PROVERA) injection 150 mg  Meds ordered this encounter  Medications  . medroxyPROGESTERone (DEPO-PROVERA) injection 150 mg    Sig:    Discussed safe sex issues. Recommend healthy diet, regular activity and weight loss. Daily vitamin D and calcium. Next PE in one year.

## 2013-02-16 ENCOUNTER — Ambulatory Visit (INDEPENDENT_AMBULATORY_CARE_PROVIDER_SITE_OTHER): Payer: BC Managed Care – PPO | Admitting: Gastroenterology

## 2013-02-16 ENCOUNTER — Encounter: Payer: Self-pay | Admitting: Gastroenterology

## 2013-02-16 VITALS — BP 145/89 | HR 94 | Temp 97.4°F | Wt 271.2 lb

## 2013-02-16 DIAGNOSIS — R1011 Right upper quadrant pain: Secondary | ICD-10-CM

## 2013-02-16 NOTE — Assessment & Plan Note (Signed)
23 year old with acute on chronic RUQ pain, exacerbated by greasy/spicy foods and associated with N/V. US of abdomen on file unremarkable; HIDA scan with normal EF and no significant discomfort after CCK infusion. Last EGD in 2012 with esophagitis, gastritis. Patient does endorse taking NSAIDs intermittently; labs unrevealing, no overt GI bleeding.   Need to rule out GI source such as gastritis, PUD; patient has long history of chronic abdominal pain as noted in HPI. If EGD is negative, an elective referral to a general surgeon should be considered simply to discuss the pros and cons of an elective cholecystectomy. Her symptoms do seem to point to a biliary process, although imaging studies and labs are unrevealing.   Proceed with upper endoscopy in the near future with Dr. Jena Gaussourk. The risks, benefits, and alternatives have been discussed in detail with patient. They have stated understanding and desire to proceed.  Phenergan 25 mg IV on call due to patient reports of "waking up" during last procedure Likely referral to general surgeon thereafter Continue Protonix DAILY, not prn.

## 2013-02-16 NOTE — Progress Notes (Signed)
Referring Provider: Babs SciaraLuking, Scott A, MD Primary Care Physician:  Lilyan PuntLUKING,SCOTT, MD Primary GI: Dr. Jena Gaussourk   Chief Complaint  Patient presents with  . Abdominal Pain    HPI:   [name] [name] presents today at the request of Dr. Gerda DissLuking secondary to persistent abdominal pain. She has a history of chronic abdominal pain, last seen in 2012 by our office. Prior colonoscopy and EGD on file. Last EGD in 2012 with erosive reflux esophagitis, chronic gastritis, negative H.pylori. Hydrogen Breath Test completed thereafter due to chronic abdominal pain, N/V/D. This was negative for bacterial overgrowth. [facility]; however, there is no documentation that she completed this.   Presents today with acute on chronic RUQ pain. US of abdomen on file from Oct 2014 unremarkable. 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 Normal EF of 94%. Appears she had a HIDA scan in Dec 2011 with an EF of 73% and associated abdominal discomfort with CCK.   Any grease, spice, notes RUQ pain, pain in shoulder blades, N/V. No epigastric pain. Chronic. Feels like symptoms are worsening. With severe pain will take Aleve, Motrin, Ibuprofen. No aspirin powders. No melena. No hematochezia. Small amount of diarrhea recently but resolved. Food sets it off. No reflux. Lots of belching. Associated lack of appetite. Has to force self to eat. Eating once or twice a day. Has a hard time remembering to take Protonix daily.   She is 40 lbs heavier than when last seen in 2012.   Past Medical History  Diagnosis Date  . Fibromyalgia   . Asthma   . Syringomyelia     thoracic spine  . GERD (gastroesophageal reflux disease)   . Chronic pain     [place]. [place]  . Chronic back pain   . DDD (degenerative disc disease)   . Scoliosis   . Bulging disc   . Spinal bifida, closed   . Depression   . Renal disorder     kidney stones  . Kidney stone     Past Surgical History  Procedure  Laterality Date  . Renal lithiasis removed    . Esophagogastroduodenoscopy  01/2010    circumferential distal esophageal erosions, hiatal hernia, excoriations/erosion in gastric body ?trauma from vomiting, bx negative and showed chronic gastritis  . Colonoscopy  01/2010    scattered petechiae and fibrotic-appearing mucosa of uncertain significance , ?resolving infection, bx benign  . Breath hydrogen test  08/08/2010       . Hydrogen breath test  08/06/2010    Procedure: HYDROGEN BREATH TEST;  Surgeon: Corbin Adeobert M Rourk, MD;  Location: [facility];  Service: Gastroenterology;  Laterality: N/A;  . Cystoscopy w/ ureteral stent placement  05/28/2011    Procedure: CYSTOSCOPY WITH RETROGRADE PYELOGRAM/URETERAL STENT PLACEMENT;  Surgeon: Ky BarbanMohammad I Javaid, MD;  Location: [facility];  Service: Urology;  Laterality: Right;  Cystoscopy with Right Retrograde Pyelogram  . Stone extraction with basket  05/28/2011    Procedure: STONE EXTRACTION WITH BASKET;  Surgeon: Ky BarbanMohammad I Javaid, MD;  Location: [facility];  Service: Urology;  Laterality: Right;  . Cystoscopy with urethral dilatation  05/28/2011    Procedure: CYSTOSCOPY WITH URETHRAL DILATATION;  Surgeon: Ky BarbanMohammad I Javaid, MD;  Location: [facility];  Service: Urology;  Laterality: N/A;    Current Outpatient Prescriptions  Medication Sig Dispense Refill  . pantoprazole (PROTONIX) 40 MG tablet Take 40 mg by mouth daily.       No current  facility-administered medications for this visit.    Allergies as of 02/16/2013 - Review Complete 02/16/2013  Allergen Reaction Noted  . Penicillins  06/11/2012  . Sulfa antibiotics  04/03/2012  . Sulfonamide derivatives Other (See Comments)     Family History  Problem Relation Age of Onset  . Multiple sclerosis Father   . Cervical cancer Mother   . Anesthesia problems Neg Hx   . Hypotension Neg Hx   . Malignant hyperthermia Neg Hx   . Pseudochol deficiency Neg Hx   . Heart disease    . Lung disease    . Asthma    . Diabetes      . Kidney disease    . Colon cancer Neg Hx     History   Social History  . Marital Status: Single    Spouse Name: N/A    Number of Children: 1  . Years of Education: 12   Occupational History  . [employer], clerical    Social History Main Topics  . Smoking status: Current Every Day Smoker -- 1.00 packs/day for 10 years    Types: Cigarettes  . Smokeless tobacco: Never Used  . Alcohol Use: 0.0 oz/week     Comment: rare  . Drug Use: No  . Sexual Activity: Not Currently    Birth Control/ Protection: None   Other Topics Concern  . None   Social History Narrative  . None    Review of Systems: As mentioned in HPI.   Physical Exam: BP 145/89  Pulse 94  Temp(Src) 97.4 F (36.3 C) (Oral)  Wt 271 lb 3.2 oz (123.016 kg)  LMP 01/08/2013 General:   Alert and oriented. No distress noted. Pleasant and cooperative.  Head:  Normocephalic and atraumatic. Eyes:  Conjuctiva clear without scleral icterus. Mouth:  Oral mucosa pink and moist. Good dentition. No lesions. Heart:  S1, S2 present without murmurs, rubs, or gallops. Regular rate and rhythm. Abdomen:  +BS, soft, TTP RUQ and non-distended. No rebound or guarding. No HSM or masses noted. Msk:  Symmetrical without gross deformities. Normal posture. Extremities:  Without edema. Neurologic:  Alert and  oriented x4;  grossly normal neurologically. Skin:  Intact without significant lesions or rashes. Cervical Nodes:  No significant cervical adenopathy. Psych:  Alert and cooperative. Normal mood and affect.  Lab Results  Component Value Date   WBC 15.1* 01/22/2013   HGB 16.3* 01/22/2013   HCT 46.2* 01/22/2013   MCV 85.1 01/22/2013   PLT 274 01/22/2013   Lab Results  Component Value Date   LIPASE 17 01/22/2013   Lab Results  Component Value Date   ALT 22 01/22/2013   AST 20 01/22/2013   ALKPHOS 104 01/22/2013   BILITOT 0.4 01/22/2013   Lab Results  Component Value Date   CREATININE 0.86 01/22/2013   BUN 11 01/22/2013   NA 139 01/22/2013    K 3.7 01/22/2013   CL 100 01/22/2013   CO2 25 01/22/2013

## 2013-02-16 NOTE — Patient Instructions (Signed)
You have been scheduled for an upper endoscopy with Dr. Jena Gaussourk in the near future.   Continue to take Protonix each morning, 30 minutes before breakfast.

## 2013-02-17 ENCOUNTER — Other Ambulatory Visit: Payer: Self-pay | Admitting: Internal Medicine

## 2013-02-17 ENCOUNTER — Encounter (HOSPITAL_COMMUNITY): Payer: Self-pay | Admitting: *Deleted

## 2013-02-17 ENCOUNTER — Encounter (HOSPITAL_COMMUNITY)
Admission: RE | Disposition: A | Discharge status: Home / Self Care | Payer: Self-pay | Source: Ambulatory Visit | Attending: Internal Medicine

## 2013-02-17 ENCOUNTER — Ambulatory Visit (HOSPITAL_COMMUNITY)
Admission: RE | Admit: 2013-02-17 | Discharge: 2013-02-17 | Disposition: A | Discharge status: Home / Self Care | Payer: BC Managed Care – PPO | Source: Ambulatory Visit | Attending: Internal Medicine | Admitting: Internal Medicine

## 2013-02-17 ENCOUNTER — Telehealth: Payer: Self-pay

## 2013-02-17 DIAGNOSIS — R1011 Right upper quadrant pain: Secondary | ICD-10-CM | POA: Insufficient documentation

## 2013-02-17 DIAGNOSIS — K449 Diaphragmatic hernia without obstruction or gangrene: Secondary | ICD-10-CM | POA: Insufficient documentation

## 2013-02-17 HISTORY — PX: ESOPHAGOGASTRODUODENOSCOPY: SHX5428

## 2013-02-17 SURGERY — EGD (ESOPHAGOGASTRODUODENOSCOPY)
Anesthesia: Moderate Sedation

## 2013-02-17 MED ORDER — PROMETHAZINE HCL 25 MG/ML IJ SOLN
INTRAMUSCULAR | Status: AC
  Filled 2013-02-17: qty 1

## 2013-02-17 MED ORDER — LIDOCAINE VISCOUS 2 % MT SOLN
OROMUCOSAL | Status: AC
  Filled 2013-02-17: qty 15

## 2013-02-17 MED ORDER — MEPERIDINE HCL 100 MG/ML IJ SOLN
INTRAMUSCULAR | Status: AC
  Filled 2013-02-17: qty 2

## 2013-02-17 MED ORDER — SODIUM CHLORIDE 0.9 % IV SOLN
INTRAVENOUS | Status: DC
  Administered 2013-02-17: 13:00:00 via INTRAVENOUS

## 2013-02-17 MED ORDER — ONDANSETRON HCL 4 MG/2ML IJ SOLN
INTRAMUSCULAR | Status: AC
  Filled 2013-02-17: qty 2

## 2013-02-17 MED ORDER — MIDAZOLAM HCL 5 MG/5ML IJ SOLN
INTRAMUSCULAR | Status: AC
  Filled 2013-02-17: qty 10

## 2013-02-17 MED ORDER — ONDANSETRON HCL 4 MG/2ML IJ SOLN
INTRAMUSCULAR | Status: DC | PRN
  Administered 2013-02-17: 4 mg via INTRAVENOUS

## 2013-02-17 MED ORDER — MIDAZOLAM HCL 5 MG/5ML IJ SOLN
INTRAMUSCULAR | Status: DC | PRN
  Administered 2013-02-17 (×3): 2 mg via INTRAVENOUS

## 2013-02-17 MED ORDER — SODIUM CHLORIDE 0.9 % IJ SOLN
INTRAMUSCULAR | Status: AC
  Filled 2013-02-17: qty 10

## 2013-02-17 MED ORDER — PROMETHAZINE HCL 25 MG/ML IJ SOLN
25.0000 mg | Freq: Once | INTRAMUSCULAR | Status: AC
  Administered 2013-02-17: 25 mg via INTRAVENOUS

## 2013-02-17 MED ORDER — STERILE WATER FOR IRRIGATION IR SOLN
Status: DC | PRN
  Administered 2013-02-17: 13:00:00

## 2013-02-17 MED ORDER — LIDOCAINE VISCOUS 2 % MT SOLN
OROMUCOSAL | Status: DC | PRN
  Administered 2013-02-17: 3 mL via OROMUCOSAL

## 2013-02-17 MED ORDER — MEPERIDINE HCL 100 MG/ML IJ SOLN
INTRAMUSCULAR | Status: DC | PRN
  Administered 2013-02-17: 25 mg via INTRAVENOUS
  Administered 2013-02-17 (×2): 50 mg via INTRAVENOUS

## 2013-02-17 NOTE — Telephone Encounter (Signed)
Pt called- They came by after her procedure and picked up the dexilant samples. She said she has already tried dexilant before and it didn't help her symptoms. Pt said she has already tried: dexilant, protonix, omeprazole, prevacid and aciphex and nothing has helped her. She just wants to go see a Careers advisersurgeon and get it over with. Please advise.

## 2013-02-17 NOTE — Interval H&P Note (Signed)
History and Physical Interval Note:  02/17/2013 1:31 PM  [name] [name]  has presented today for surgery, with the diagnosis of RUQ PAIN  The various methods of treatment have been discussed with the patient and family. After consideration of risks, benefits and other options for treatment, the patient has consented to  Procedure(s) with comments: ESOPHAGOGASTRODUODENOSCOPY (EGD) (N/A) - 11:30-moved to [address] to notify pt as a surgical intervention .  The patient's history has been reviewed, patient examined, no change in status, stable for surgery.  I have reviewed the patient's chart and labs.  Questions were answered to the patient's satisfaction.     Eula Listenobert Rourk

## 2013-02-17 NOTE — H&P (View-Only) (Signed)
Referring Provider: Babs SciaraLuking, Scott A, MD Primary Care Physician:  Lilyan PuntLUKING,SCOTT, MD Primary GI: Dr. Jena Gaussourk   Chief Complaint  Patient presents with  . Abdominal Pain    HPI:   [name] [name] presents today at the request of Dr. Gerda DissLuking secondary to persistent abdominal pain. She has a history of chronic abdominal pain, last seen in 2012 by our office. Prior colonoscopy and EGD on file. Last EGD in 2012 with erosive reflux esophagitis, chronic gastritis, negative H.pylori. Hydrogen Breath Test completed thereafter due to chronic abdominal pain, N/V/D. This was negative for bacterial overgrowth. [facility]; however, there is no documentation that she completed this.   Presents today with acute on chronic RUQ pain. US of abdomen on file from Oct 2014 unremarkable. 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 Normal EF of 94%. Appears she had a HIDA scan in Dec 2011 with an EF of 73% and associated abdominal discomfort with CCK.   Any grease, spice, notes RUQ pain, pain in shoulder blades, N/V. No epigastric pain. Chronic. Feels like symptoms are worsening. With severe pain will take Aleve, Motrin, Ibuprofen. No aspirin powders. No melena. No hematochezia. Small amount of diarrhea recently but resolved. Food sets it off. No reflux. Lots of belching. Associated lack of appetite. Has to force self to eat. Eating once or twice a day. Has a hard time remembering to take Protonix daily.   She is 40 lbs heavier than when last seen in 2012.   Past Medical History  Diagnosis Date  . Fibromyalgia   . Asthma   . Syringomyelia     thoracic spine  . GERD (gastroesophageal reflux disease)   . Chronic pain     [place]  . Chronic back pain   . DDD (degenerative disc disease)   . Scoliosis   . Bulging disc   . Spinal bifida, closed   . Depression   . Renal disorder     kidney stones  . Kidney stone     Past Surgical History  Procedure  Laterality Date  . Renal lithiasis removed    . Esophagogastroduodenoscopy  01/2010    circumferential distal esophageal erosions, hiatal hernia, excoriations/erosion in gastric body ?trauma from vomiting, bx negative and showed chronic gastritis  . Colonoscopy  01/2010    scattered petechiae and fibrotic-appearing mucosa of uncertain significance , ?resolving infection, bx benign  . Breath hydrogen test  08/08/2010       . Hydrogen breath test  08/06/2010    Procedure: HYDROGEN BREATH TEST;  Surgeon: Corbin Adeobert M Rourk, MD;  Location: [facility];  Service: Gastroenterology;  Laterality: N/A;  . Cystoscopy w/ ureteral stent placement  05/28/2011    Procedure: CYSTOSCOPY WITH RETROGRADE PYELOGRAM/URETERAL STENT PLACEMENT;  Surgeon: Ky BarbanMohammad I Javaid, MD;  Location: [facility];  Service: Urology;  Laterality: Right;  Cystoscopy with Right Retrograde Pyelogram  . Stone extraction with basket  05/28/2011    Procedure: STONE EXTRACTION WITH BASKET;  Surgeon: Ky BarbanMohammad I Javaid, MD;  Location: [facility];  Service: Urology;  Laterality: Right;  . Cystoscopy with urethral dilatation  05/28/2011    Procedure: CYSTOSCOPY WITH URETHRAL DILATATION;  Surgeon: Ky BarbanMohammad I Javaid, MD;  Location: [facility];  Service: Urology;  Laterality: N/A;    Current Outpatient Prescriptions  Medication Sig Dispense Refill  . pantoprazole (PROTONIX) 40 MG tablet Take 40 mg by mouth daily.       No current  facility-administered medications for this visit.    Allergies as of 02/16/2013 - Review Complete 02/16/2013  Allergen Reaction Noted  . Penicillins  06/11/2012  . Sulfa antibiotics  04/03/2012  . Sulfonamide derivatives Other (See Comments)     Family History  Problem Relation Age of Onset  . Multiple sclerosis Father   . Cervical cancer Mother   . Anesthesia problems Neg Hx   . Hypotension Neg Hx   . Malignant hyperthermia Neg Hx   . Pseudochol deficiency Neg Hx   . Heart disease    . Lung disease    . Asthma    . Diabetes      . Kidney disease    . Colon cancer Neg Hx     History   Social History  . Marital Status: Single    Spouse Name: N/A    Number of Children: 1  . Years of Education: 12   Occupational History  . [employer], clerical    Social History Main Topics  . Smoking status: Current Every Day Smoker -- 1.00 packs/day for 10 years    Types: Cigarettes  . Smokeless tobacco: Never Used  . Alcohol Use: 0.0 oz/week     Comment: rare  . Drug Use: No  . Sexual Activity: Not Currently    Birth Control/ Protection: None   Other Topics Concern  . None   Social History Narrative  . None    Review of Systems: As mentioned in HPI.   Physical Exam: BP 145/89  Pulse 94  Temp(Src) 97.4 F (36.3 C) (Oral)  Wt 271 lb 3.2 oz (123.016 kg)  LMP 01/08/2013 General:   Alert and oriented. No distress noted. Pleasant and cooperative.  Head:  Normocephalic and atraumatic. Eyes:  Conjuctiva clear without scleral icterus. Mouth:  Oral mucosa pink and moist. Good dentition. No lesions. Heart:  S1, S2 present without murmurs, rubs, or gallops. Regular rate and rhythm. Abdomen:  +BS, soft, TTP RUQ and non-distended. No rebound or guarding. No HSM or masses noted. Msk:  Symmetrical without gross deformities. Normal posture. Extremities:  Without edema. Neurologic:  Alert and  oriented x4;  grossly normal neurologically. Skin:  Intact without significant lesions or rashes. Cervical Nodes:  No significant cervical adenopathy. Psych:  Alert and cooperative. Normal mood and affect.  Lab Results  Component Value Date   WBC 15.1* 01/22/2013   HGB 16.3* 01/22/2013   HCT 46.2* 01/22/2013   MCV 85.1 01/22/2013   PLT 274 01/22/2013   Lab Results  Component Value Date   LIPASE 17 01/22/2013   Lab Results  Component Value Date   ALT 22 01/22/2013   AST 20 01/22/2013   ALKPHOS 104 01/22/2013   BILITOT 0.4 01/22/2013   Lab Results  Component Value Date   CREATININE 0.86 01/22/2013   BUN 11 01/22/2013   NA 139 01/22/2013    K 3.7 01/22/2013   CL 100 01/22/2013   CO2 25 01/22/2013

## 2013-02-17 NOTE — Telephone Encounter (Signed)
REFERRAL HAS BEEN MADE TO DR Lovell SheehanJENKINS

## 2013-02-17 NOTE — Discharge Instructions (Signed)
EGD Discharge instructions Please read the instructions outlined below and refer to this sheet in the next few weeks. These discharge instructions provide you with general information on caring for yourself after you leave the hospital. Your doctor may also give you specific instructions. While your treatment has been planned according to the most current medical practices available, unavoidable complications occasionally occur. If you have any problems or questions after discharge, please call your doctor. ACTIVITY  You may resume your regular activity but move at a slower pace for the next 24 hours.   Take frequent rest periods for the next 24 hours.   Walking will help expel (get rid of) the air and reduce the bloated feeling in your abdomen.   No driving for 24 hours (because of the anesthesia (medicine) used during the test).   You may shower.   Do not sign any important legal documents or operate any machinery for 24 hours (because of the anesthesia used during the test).  NUTRITION  Drink plenty of fluids.   You may resume your normal diet.   Begin with a light meal and progress to your normal diet.   Avoid alcoholic beverages for 24 hours or as instructed by your caregiver.  MEDICATIONS  You may resume your normal medications unless your caregiver tells you otherwise.  WHAT YOU CAN EXPECT TODAY  You may experience abdominal discomfort such as a feeling of fullness or gas pains.  FOLLOW-UP  Your doctor will discuss the results of your test with you.  SEEK IMMEDIATE MEDICAL ATTENTION IF ANY OF THE FOLLOWING OCCUR:  Excessive nausea (feeling sick to your stomach) and/or vomiting.   Severe abdominal pain and distention (swelling).   Trouble swallowing.   Temperature over 101 F (37.8 C).   Rectal bleeding or vomiting of blood.  EGD Discharge instructions Please read the instructions outlined below and refer to this sheet in the next few weeks. These discharge  instructions provide you with general information on caring for yourself after you leave the hospital. Your doctor may also give you specific instructions. While your treatment has been planned according to the most current medical practices available, unavoidable complications occasionally occur. If you have any problems or questions after discharge, please call your doctor. ACTIVITY  You may resume your regular activity but move at a slower pace for the next 24 hours.   Take frequent rest periods for the next 24 hours.   Walking will help expel (get rid of) the air and reduce the bloated feeling in your abdomen.   No driving for 24 hours (because of the anesthesia (medicine) used during the test).   You may shower.   Do not sign any important legal documents or operate any machinery for 24 hours (because of the anesthesia used during the test).  NUTRITION  Drink plenty of fluids.   You may resume your normal diet.   Begin with a light meal and progress to your normal diet.   Avoid alcoholic beverages for 24 hours or as instructed by your caregiver.  MEDICATIONS  You may resume your normal medications unless your caregiver tells you otherwise.  WHAT YOU CAN EXPECT TODAY  You may experience abdominal discomfort such as a feeling of fullness or gas pains.  FOLLOW-UP  Your doctor will discuss the results of your test with you.  SEEK IMMEDIATE MEDICAL ATTENTION IF ANY OF THE FOLLOWING OCCUR:  Excessive nausea (feeling sick to your stomach) and/or vomiting.   Severe abdominal pain and  distention (swelling).   Trouble swallowing.   Temperature over 101 F (37.8 C).   Rectal bleeding or vomiting of blood.    Stop protonix; go by the office for 2 week sample course of Dexilant 60 mg daily. If this does not help, I recommend you to go and see a general surgeon regarding getting your gallbladder out

## 2013-02-17 NOTE — Op Note (Signed)
[facility] [address] [place], [id number]   ENDOSCOPY PROCEDURE REPORT  PATIENT: [name] [name], [name] L.  MR#: [id number] BIRTHDATE: [birth year] , 22  yrs. old GENDER: Female ENDOSCOPIST: R.  Roetta SessionsMichael Rourk, MD FACP FACG REFERRED BY:  Lilyan PuntScott Luking, M.D. PROCEDURE DATE:  02/17/2013 PROCEDURE:     Diagnostic EGD  INDICATIONS:     Right upper quadrant abdominpain  INFORMED CONSENT:   The risks, benefits, limitations, alternatives and imponderables have been discussed.  The potential for biopsy, esophogeal dilation, etc. have also been reviewed.  Questions have been answered.  All parties agreeable.  Please see the history and physical in the medical record for more information.  MEDICATIONS:  Versed 6 mg IV and Demerol 125 mg IV and Phenergan 25 mg. Zofran 4 mg IV. Xylocaine gel 2% orally  DESCRIPTION OF PROCEDURE:   The EG-[id number] ([id number])  endoscope was introduced through the mouth and advanced to the second portion of the duodenum without difficulty or limitations.  The mucosal surfaces were surveyed very carefully during advancement of the scope and upon withdrawal.  Retroflexion view of the proximal stomach and esophagogastric junction was performed.      FINDINGS: Normal esophagus. Stomach empty. Small hiatal hernia. Couple tiny antral erosions. No ulcer or infiltrating process. Patent pylorus. Normal first and second portion of the duodenum the  THERAPEUTIC / DIAGNOSTIC MANEUVERS PERFORMED:  None   COMPLICATIONS:  None  IMPRESSION:   Small hiatal hernia. Tiny antral erosions  -  of doubtful clinical significance  RECOMMENDATIONS:  The patient's symptoms sound as though they're emanating from her gallbladder. In spite of negative objective studies (ultrasounds and HIDA's), her pain may well be gallbladder in origin.  Let's try a brief course of Dexilant 60 mg orally daily to see if this approach would improve her symptoms; she is to go by my office for  free samples. If this approach does not help, then I would recommend an elective surgical consultation to discuss the pros and cons of cholecystectomy.    _______________________________ R. Roetta SessionsMichael Rourk, MD FACP [facility] eSigned:  R. Roetta SessionsMichael Rourk, MD FACP [facility] 02/17/2013 2:10 PM     CC:  PATIENT NAME:  [name] [name], [name] L. MR#: [id number]

## 2013-02-17 NOTE — Progress Notes (Signed)
cc'd to pcp 

## 2013-02-17 NOTE — Op Note (Signed)
Patient wants gallbladder out. Spoke with Dr. Jena Gaussourk it was his recommendation to take Dexilant for 2 weeks and if no improvement seen within two weeks, she be referred to Dr. Lovell SheehanJenkins for a consult to have her gallbladder removed.  Patient agrees to do as Dr. Jena Gaussourk has advised and left with no complaint.

## 2013-02-17 NOTE — Interval H&P Note (Signed)
History and Physical Interval Note:  02/17/2013 1:29 PM  [name] [name]  has presented today for surgery, with the diagnosis of RUQ PAIN  The various methods of treatment have been discussed with the patient and family. After consideration of risks, benefits and other options for treatment, the patient has consented to  Procedure(s) with comments: ESOPHAGOGASTRODUODENOSCOPY (EGD) (N/A) - 11:30-moved to [address] to notify pt as a surgical intervention .  The patient's history has been reviewed, patient examined, no change in status, stable for surgery.  I have reviewed the patient's chart and labs.  Questions were answered to the patient's satisfaction.     No change. EGD per plan.The risks, benefits, limitations, alternatives and imponderables have been reviewed with the patient. Potential for esophageal dilation, biopsy, etc. have also been reviewed.  Questions have been answered. All parties agreeable.  Eula Listenobert Tameka Hoiland

## 2013-02-17 NOTE — Telephone Encounter (Signed)
Benedetto GoadLeighann, please refer pt to Dr. Lovell SheehanJenkins.

## 2013-02-17 NOTE — Telephone Encounter (Signed)
We can get ahead and get her appointment to see Dr. Lovell SheehanJenkins

## 2013-02-18 NOTE — Telephone Encounter (Signed)
Pt is aware.  

## 2013-02-22 ENCOUNTER — Encounter (HOSPITAL_COMMUNITY): Payer: Self-pay | Admitting: Internal Medicine

## 2013-02-26 NOTE — Progress Notes (Signed)
Patient notified and verbalized understanding. 

## 2013-03-03 ENCOUNTER — Emergency Department (HOSPITAL_COMMUNITY)
Admission: EM | Admit: 2013-03-03 | Discharge: 2013-03-03 | Disposition: A | Discharge status: Home / Self Care | Payer: BC Managed Care – PPO | Attending: Emergency Medicine | Admitting: Emergency Medicine

## 2013-03-03 ENCOUNTER — Encounter (HOSPITAL_COMMUNITY): Payer: Self-pay | Admitting: Emergency Medicine

## 2013-03-03 DIAGNOSIS — S91009A Unspecified open wound, unspecified ankle, initial encounter: Principal | ICD-10-CM

## 2013-03-03 DIAGNOSIS — J45909 Unspecified asthma, uncomplicated: Secondary | ICD-10-CM | POA: Insufficient documentation

## 2013-03-03 DIAGNOSIS — Y929 Unspecified place or not applicable: Secondary | ICD-10-CM | POA: Insufficient documentation

## 2013-03-03 DIAGNOSIS — S81809A Unspecified open wound, unspecified lower leg, initial encounter: Principal | ICD-10-CM

## 2013-03-03 DIAGNOSIS — K219 Gastro-esophageal reflux disease without esophagitis: Secondary | ICD-10-CM | POA: Insufficient documentation

## 2013-03-03 DIAGNOSIS — Z880 Allergy status to penicillin: Secondary | ICD-10-CM | POA: Insufficient documentation

## 2013-03-03 DIAGNOSIS — Z8669 Personal history of other diseases of the nervous system and sense organs: Secondary | ICD-10-CM | POA: Insufficient documentation

## 2013-03-03 DIAGNOSIS — S81009A Unspecified open wound, unspecified knee, initial encounter: Secondary | ICD-10-CM | POA: Insufficient documentation

## 2013-03-03 DIAGNOSIS — Y9323 Activity, snow (alpine) (downhill) skiing, snow boarding, sledding, tobogganing and snow tubing: Secondary | ICD-10-CM | POA: Insufficient documentation

## 2013-03-03 DIAGNOSIS — Z8659 Personal history of other mental and behavioral disorders: Secondary | ICD-10-CM | POA: Insufficient documentation

## 2013-03-03 DIAGNOSIS — IMO0002 Reserved for concepts with insufficient information to code with codable children: Secondary | ICD-10-CM

## 2013-03-03 DIAGNOSIS — Z8739 Personal history of other diseases of the musculoskeletal system and connective tissue: Secondary | ICD-10-CM | POA: Insufficient documentation

## 2013-03-03 DIAGNOSIS — Z23 Encounter for immunization: Secondary | ICD-10-CM | POA: Insufficient documentation

## 2013-03-03 DIAGNOSIS — W1809XA Striking against other object with subsequent fall, initial encounter: Secondary | ICD-10-CM | POA: Insufficient documentation

## 2013-03-03 DIAGNOSIS — F17200 Nicotine dependence, unspecified, uncomplicated: Secondary | ICD-10-CM | POA: Insufficient documentation

## 2013-03-03 DIAGNOSIS — Z79899 Other long term (current) drug therapy: Secondary | ICD-10-CM | POA: Insufficient documentation

## 2013-03-03 DIAGNOSIS — G8929 Other chronic pain: Secondary | ICD-10-CM | POA: Insufficient documentation

## 2013-03-03 DIAGNOSIS — Z87442 Personal history of urinary calculi: Secondary | ICD-10-CM | POA: Insufficient documentation

## 2013-03-03 MED ORDER — CEPHALEXIN 500 MG PO CAPS: 500.0000 mg | ORAL_CAPSULE | Freq: Four times a day (QID) | ORAL | Status: DC

## 2013-03-03 MED ORDER — TETANUS-DIPHTH-ACELL PERTUSSIS 5-2.5-18.5 LF-MCG/0.5 IM SUSP
0.5000 mL | Freq: Once | INTRAMUSCULAR | Status: AC
  Administered 2013-03-03: 0.5 mL via INTRAMUSCULAR
  Filled 2013-03-03: qty 0.5

## 2013-03-03 MED ORDER — BACITRACIN-NEOMYCIN-POLYMYXIN 400-5-5000 EX OINT
TOPICAL_OINTMENT | CUTANEOUS | Status: AC
  Administered 2013-03-03: 1
  Filled 2013-03-03: qty 1

## 2013-03-03 NOTE — ED Notes (Signed)
Wounds cleaned and antibiotic ointment applied. Dressing placed.

## 2013-03-03 NOTE — ED Notes (Signed)
Pt reports was sledding last night and slid sideways into a concrete drain pipe.  Pt has laceration to r inner thigh.  Bruising noted.  Last tetanus was 9 years ago.

## 2013-03-03 NOTE — Discharge Instructions (Signed)
Wound Care Wound care helps prevent pain and infection.  You may need a tetanus shot if:  You cannot remember when you had your last tetanus shot.  You have never had a tetanus shot.  The injury broke your skin. If you need a tetanus shot and you choose not to have one, you may get tetanus. Sickness from tetanus can be serious. HOME CARE   Only take medicine as told by your doctor.  Clean the wound daily with mild soap and water.  Change any bandages (dressings) as told by your doctor.  Put medicated cream and a bandage on the wound as told by your doctor.  Change the bandage if it gets wet, dirty, or starts to smell.  Take showers. Do not take baths, swim, or do anything that puts your wound under water.  Rest and raise (elevate) the wound until the pain and puffiness (swelling) are better.  Keep all doctor visits as told. GET HELP RIGHT AWAY IF:   Yellowish-white fluid (pus) comes from the wound.  Medicine does not lessen your pain.  There is a red streak going away from the wound.  You have a fever. MAKE SURE YOU:   Understand these instructions.  Will watch your condition.  Will get help right away if you are not doing well or get worse. Document Released: 10/10/2007 Document Revised: 03/25/2011 Document Reviewed: 05/06/2010 [facility] Patient Information 2014 [employer] [place].   Your tetanus was updated today Keep wound clean and dry, may use mild soap and water Take antibiotic as prescribed Follow up with your doctor next week

## 2013-03-03 NOTE — ED Provider Notes (Signed)
CSN: [id number]     Arrival date & time 03/03/13  1220 History   First MD Initiated Contact with Patient 03/03/13 1304     Chief Complaint  Patient presents with  . Laceration     (Consider location/radiation/quality/duration/timing/severity/associated sxs/prior Treatment) Patient is a 23 y.o. female presenting with skin laceration. The history is provided by the patient. No language interpreter was used.  Laceration Location:  Leg Leg laceration location:  R upper leg Length (cm):  4 Depth:  Cutaneous Quality: jagged   Bleeding: controlled   Time since incident:  20 hours Pain details:    Quality:  Dull Foreign body present:  No foreign bodies  patient is a 23 year old female who is in a sledding accident last night. She reports that she was going down the hill on a sled and hit a concrete drainpipe. She presents with multiple bruises to her posterior thighs and a laceration on her inner-posterior right thigh. She reports that she has been walking but has difficulty maintaining a sitting position. She has been laying on her side at home and complains of some back discomfort. She reports that this back pain is normal for her. She reports that they cleaned the laceration about an hour after her accident and applied butterfly strips.   Past Medical History  Diagnosis Date  . Fibromyalgia   . Asthma   . Syringomyelia     thoracic spine  . GERD (gastroesophageal reflux disease)   . Chronic pain     Dr. Murray Hodgkins in [place]  . Chronic back pain   . DDD (degenerative disc disease)   . Scoliosis   . Bulging disc   . Spinal bifida, closed   . Depression   . Renal disorder     kidney stones  . Kidney stone    Past Surgical History  Procedure Laterality Date  . Renal lithiasis removed    . Esophagogastroduodenoscopy  01/2010    circumferential distal esophageal erosions, hiatal hernia, excoriations/erosion in gastric body ?trauma from vomiting, bx negative and showed chronic gastritis    . Colonoscopy  01/2010    scattered petechiae and fibrotic-appearing mucosa of uncertain significance , ?resolving infection, bx benign  . Breath hydrogen test  08/08/2010       . Hydrogen breath test  08/06/2010    Procedure: HYDROGEN BREATH TEST;  Surgeon: Corbin Ade, MD;  Location: [facility];  Service: Gastroenterology;  Laterality: N/A;  . Cystoscopy w/ ureteral stent placement  05/28/2011    Procedure: CYSTOSCOPY WITH RETROGRADE PYELOGRAM/URETERAL STENT PLACEMENT;  Surgeon: Ky Barban, MD;  Location: [facility];  Service: Urology;  Laterality: Right;  Cystoscopy with Right Retrograde Pyelogram  . Stone extraction with basket  05/28/2011    Procedure: STONE EXTRACTION WITH BASKET;  Surgeon: Ky Barban, MD;  Location: [facility];  Service: Urology;  Laterality: Right;  . Cystoscopy with urethral dilatation  05/28/2011    Procedure: CYSTOSCOPY WITH URETHRAL DILATATION;  Surgeon: Ky Barban, MD;  Location: [facility];  Service: Urology;  Laterality: N/A;  . Esophagogastroduodenoscopy N/A 02/17/2013    Procedure: ESOPHAGOGASTRODUODENOSCOPY (EGD);  Surgeon: Corbin Ade, MD;  [facility]: [facility];  Service: Endoscopy;  Laterality: N/A;  11:30-moved to [address] to notify pt   Family History  Problem Relation Age of Onset  . Multiple sclerosis Father   . Cervical cancer Mother   . Anesthesia problems Neg Hx   . Hypotension Neg Hx   . Malignant hyperthermia Neg  Hx   . Pseudochol deficiency Neg Hx   . Heart disease    . Lung disease    . Asthma    . Diabetes    . Kidney disease    . Colon cancer Neg Hx    History  Substance Use Topics  . Smoking status: Current Every Day Smoker -- 1.00 packs/day for 10 years    Types: Cigarettes  . Smokeless tobacco: Never Used  . Alcohol Use: 0.0 oz/week     Comment: rare   OB History   Grav Para Term Preterm Abortions TAB SAB Ect Mult Living   1 1 1       1      Review of Systems  Respiratory: Negative for shortness of breath and  wheezing.   Cardiovascular: Negative for chest pain.  Musculoskeletal: Negative for gait problem, joint swelling and neck pain.  Skin: Positive for wound.      Allergies  Penicillins; Sulfa antibiotics; and Sulfonamide derivatives  Home Medications   Current Outpatient Rx  Name  Route  Sig  Dispense  Refill  . medroxyPROGESTERone (DEPO-PROVERA) 150 MG/ML injection   Intramuscular   Inject 150 mg into the muscle every 3 (three) months.         . pantoprazole (PROTONIX) 40 MG tablet   Oral   Take 40 mg by mouth daily.         . promethazine (PHENERGAN) 25 MG tablet   Oral   Take 25 mg by mouth every 6 (six) hours as needed for nausea or vomiting.          BP 141/93  Pulse 92  Temp(Src) 98.1 F (36.7 C) (Oral)  Resp 18  Ht 5' (1.524 m)  Wt 260 lb (117.935 kg)  BMI 50.78 kg/m2  SpO2 97% Physical Exam  Nursing note and vitals reviewed. Constitutional: She is oriented to person, place, and time. She appears well-developed and well-nourished. No distress.  HENT:  Head: Normocephalic and atraumatic.  Eyes: Conjunctivae and EOM are normal.  Neck: Normal range of motion. Neck supple. No tracheal deviation present. No thyromegaly present.  Cardiovascular: Normal rate, regular rhythm and normal heart sounds.   Pulmonary/Chest: Effort normal and breath sounds normal. No respiratory distress. She has no wheezes.  Abdominal: Soft. Bowel sounds are normal. She exhibits no distension. There is no tenderness.  Musculoskeletal: Normal range of motion.  Neurological: She is alert and oriented to person, place, and time. Coordination normal.  Skin: Skin is warm and dry.     Bruising upper posterior thighs bilaterally. Right inner-posterior thigh with 4cm laceration. No drainage, mild erythema noted. 3 butterfly bandages in place.  Psychiatric: She has a normal mood and affect. Her behavior is normal. Judgment and thought content normal.    ED Course  Procedures (including  critical care time) Labs Review Labs Reviewed - No data to display Imaging Review No results found.  EKG Interpretation   None       MDM   Final diagnoses:  Laceration    Wound to posterior thigh cleaned and dressed with neosporin.Tdap updated. No sutures at this time due to length of time that has passed since injury. Butterfly bandages, remain in place. Discussed wound care with pt and mother.      Irish EldersKelly Jacarra Bobak, NP 03/07/13 1521

## 2013-03-08 NOTE — ED Provider Notes (Signed)
Medical screening examination/treatment/procedure(s) were performed by non-physician practitioner and as supervising physician I was immediately available for consultation/collaboration.  EKG Interpretation   None         Sequita Wise L Sherlin Sonier, MD 03/08/13 0757 

## 2013-03-10 ENCOUNTER — Ambulatory Visit (INDEPENDENT_AMBULATORY_CARE_PROVIDER_SITE_OTHER): Payer: BC Managed Care – PPO | Admitting: Family Medicine

## 2013-03-10 ENCOUNTER — Encounter: Payer: Self-pay | Admitting: Family Medicine

## 2013-03-10 VITALS — BP 132/82 | Ht 60.5 in | Wt 276.0 lb

## 2013-03-10 DIAGNOSIS — S81811A Laceration without foreign body, right lower leg, initial encounter: Secondary | ICD-10-CM

## 2013-03-10 DIAGNOSIS — S81809A Unspecified open wound, unspecified lower leg, initial encounter: Secondary | ICD-10-CM

## 2013-03-10 NOTE — Progress Notes (Signed)
   Subjective:    Patient ID: [name] [name], female    DOB: Dec 01, 1990, 23 y.o.   MRN: [id number]  HPI  Patient appears with laceration to back of right thigh-occurred during a sledding accident. Patient did not receive stitches because she was unable to get to ER to to bad weather for a day after the accident. Steri Strips applied.  Review of Systems Denies fevers drainage severe pain    Objective:   Physical Exam Laceration on the back of the leg has a stellate appearance seems to be healing in by secondary intention no sign of cellulitis.       Assessment & Plan:  Laceration must heal by secondary intention daily cleaning dressings triple antibiotic ointment is severe redness pain discomfort or drainage notify us and we will probably take several weeks to heal up. No need for any type of sutures or Steri-Strips.

## 2013-03-12 NOTE — Progress Notes (Signed)
Pt will call back to schedule appt.

## 2013-03-17 ENCOUNTER — Encounter (HOSPITAL_COMMUNITY): Payer: Self-pay | Admitting: Pharmacy Technician

## 2013-03-17 ENCOUNTER — Telehealth: Payer: Self-pay | Admitting: Family Medicine

## 2013-03-17 MED ORDER — DOXYCYCLINE HYCLATE 100 MG PO TABS: 100.0000 mg | ORAL_TABLET | Freq: Two times a day (BID) | ORAL | Status: DC

## 2013-03-17 NOTE — H&P (Signed)
  NTS SOAP Note  Vital Signs:  Vitals as of: 03/16/2013: Systolic 145: Diastolic 82: Heart Rate 97: Temp 100.74F: Height 15ft 0.5in: Weight 275Lbs 0 Ounces: Pain Level 5: BMI 52.82  BMI : 52.82 kg/m2  Subjective: This [age] Years 150 Months old Female presents for of right upper quadrant abdominal pain.  Has been present for some time now.  Made worse with fatty food.  No fever, chills, jaundice.  U/S of gallbladder negative.  HIDA shows normal ejection fraction, but reproducible symptoms with CCK injection.  Has had extensive workup, but no other diagnosis made.  Review of Symptoms:  Constitutional:unremarkable   Head:unremarkable    Eyes:unremarkable   sinus problems Cardiovascular:  unremarkable   Respiratory:  dyspnea Gastrointestinal:    abdominal pain,nausea,heartburn Genitourinary:unremarkable       joint and back pain Skin:unremarkable Hematolgic/Lymphatic:unremarkable     Allergic/Immunologic:unremarkable     Past Medical History:    Reviewed  Past Medical History  Surgical History: kidney stone, EGD, TCS Medical Problems: asthma, fibromyalgia, depression, GERD, DDD, scoliosis Allergies: sulfa, PCN Medications: protonix   Social History:Reviewed  Social History  Preferred Language: English Race:  White Ethnicity: Not Hispanic / Latino Age: 23 Years 0 Months Marital Status:  M Recreational drug(s): no   Smoking Status: Current every day smoker reviewed on 03/16/2013 Started Date: 01/14/2006 Packs per day: 1.00 Functional Status reviewed on 03/16/2013 ------------------------------------------------ Bathing: Normal Cooking: Normal Dressing: Normal Driving: Normal Eating: Normal Managing Meds: Normal Oral Care: Normal Shopping: Normal Toileting: Normal Transferring: Normal Walking: Normal Cognitive Status reviewed on 03/16/2013 ------------------------------------------------ Attention: Normal Decision Making:  Normal Language: Normal Memory: Normal Motor: Normal Perception: Normal Problem Solving: Normal Visual and Spatial: Normal   Family History:  Reviewed  Family Health History Mother, Living; Cervical cancer;  Father, Living; Multiple sclerosis;     Objective Information: General:  Well appearing, well nourished in no distress.   no scleral icterus Mouth:unremarkable  Mucous membranes:          number:               Tongue :             Parotid:         P      Heart:  RRR, no murmur or gallop.  Normal S1, S2.  No S3, S4.  Lungs:    CTA bilaterally, no wheezes, rhonchi, rales.  Breathing unlabored. Abdomen:Soft, minimal tenderness in right upper quadrant to palpation, ND, no HSM, no masses.  Assessment:Chronic cholecystitis  Diagnoses: 575.11 Chronic cholecystitis (Chronic cholecystitis)  Procedures: [id number] - OFFICE OUTPATIENT NEW 30 MINUTES    Plan:  Scheduled for laparoscopic cholecystectomy on 03/26/13.  Patient fully realizes that her symptoms may return.   Patient Education:Alternative treatments to surgery were discussed with patient (and family).  Risks and benefits  of procedure including bleeding, infection, hepatobiliary injury, and the possibility of an open procedure were fully explained to the patient (and family) who gave informed consent. Patient/family questions were addressed.  Follow-up:Pending Surgery

## 2013-03-17 NOTE — Telephone Encounter (Signed)
Pt calling to say that her leg if now red. Pt seen on 2/25 and told her that if her leg  Became red to call in to get Antibiotics called in for her.

## 2013-03-17 NOTE — Telephone Encounter (Signed)
Patient notified and verbalized understanding. 

## 2013-03-17 NOTE — Telephone Encounter (Signed)
I recommend doxycycline 100 milligrams, 1 twice a day, take with a snack and a tall glass of water, use for 10 days, #20. If the area increases in severity she needs to be seen. Warm compresses on a frequent basis 10 minutes at a time several times a day, this area needs to be totally healed of infection before her surgery. If it is not healing up by the time of surgery she needs to followup with us and also notify Dr. Lovell SheehanJenkins.

## 2013-03-19 NOTE — Patient Instructions (Signed)
Rakeb Flonnie OvermanL Bray  03/19/2013   Your procedure is scheduled on:   03/26/2013  [facility] at  615 AM.  Call this number if you have problems the morning of surgery: [phone]   Remember:   Do not eat food or drink liquids after midnight.   Take these medicines the morning of surgery with A SIP OF WATER: none   Do not wear jewelry, make-up or nail polish.  Do not wear lotions, powders, or perfumes.   Do not shave 48 hours prior to surgery. Men may shave face and neck.  Do not bring valuables to the hospital.  [facility] is not responsible for any belongings or valuables.               Contacts, dentures or bridgework may not be worn into surgery.  Leave suitcase in the car. After surgery it may be brought to your room.  For patients admitted to the hospital, discharge time is determined by your treatment team.               Patients discharged the day of surgery will not be allowed to drive home.  Name and phone number of your driver: family  Special Instructions: Shower using CHG 2 nights before surgery and the night before surgery.  If you shower the day of surgery use CHG.  Use special wash - you have one bottle of CHG for all showers.  You should use approximately 1/3 of the bottle for each shower.   Please read over the following fact sheets that you were given: Pain Booklet, Coughing and Deep Breathing, Surgical Site Infection Prevention, Anesthesia Post-op Instructions and Care and Recovery After Surgery Laparoscopic Cholecystectomy Laparoscopic cholecystectomy is surgery to remove the gallbladder. The gallbladder is located in the upper right part of the abdomen, behind the liver. It is a storage sac for bile produced in the liver. Bile aids in the digestion and absorption of fats. Cholecystectomy is often done for inflammation of the gallbladder (cholecystitis). This condition is usually caused by a buildup of gallstones (cholelithiasis) in your gallbladder.  Gallstones can block the flow of bile, resulting in inflammation and pain. In severe cases, emergency surgery may be required. When emergency surgery is not required, you will have time to prepare for the procedure. Laparoscopic surgery is an alternative to open surgery. Laparoscopic surgery has a shorter recovery time. Your common bile duct may also need to be examined during the procedure. If stones are found in the common bile duct, they may be removed. LET [facility] KNOW ABOUT:  Any allergies you have.  All medicines you are taking, including vitamins, herbs, eye drops, creams, and over-the-counter medicines.  Previous problems you or members of your family have had with the use of anesthetics.  Any blood disorders you have.  Previous surgeries you have had.  Medical conditions you have. RISKS AND COMPLICATIONS Generally, this is a safe procedure. However, as with any procedure, complications can occur. Possible complications include:  Infection.  Damage to the common bile duct, nerves, arteries, veins, or other internal organs such as the stomach, liver, or intestines.  Bleeding.  A stone may remain in the common bile duct.  A bile leak from the cyst duct that is clipped when your gallbladder is removed.  The need to convert to open surgery, which requires a larger incision in the abdomen. This may be necessary if your surgeon thinks it is  not safe to continue with a laparoscopic procedure. BEFORE THE PROCEDURE  Ask your health care provider about changing or stopping any regular medicines. You will need to stop taking aspirin or blood thinners at least 5 days prior to surgery.  Do not eat or drink anything after midnight the night before surgery.  Let your health care provider know if you develop a cold or other infectious problem before surgery. PROCEDURE   You will be given medicine to make you sleep through the procedure (general anesthetic). A breathing  tube will be placed in your mouth.  When you are asleep, your surgeon will make several small cuts (incisions) in your abdomen.  A thin, lighted tube with a tiny camera on the end (laparoscope) is inserted through one of the small incisions. The camera on the laparoscope sends a picture to a TV screen in the operating room. This gives the surgeon a good view inside your abdomen.  A gas will be pumped into your abdomen. This expands your abdomen so that the surgeon has more room to perform the surgery.  Other tools needed for the procedure are inserted through the other incisions. The gallbladder is removed through one of the incisions.  After the removal of your gallbladder, the incisions will be closed with stitches, staples, or skin glue. AFTER THE PROCEDURE  You will be taken to a recovery area where your progress will be checked often.  You may be allowed to go home the same day if your pain is controlled and you can tolerate liquids. Document Released: 12/31/2004 Document Revised: 10/21/2012 Document Reviewed: 08/12/2012 [facility] Patient Information 2014 [place]. PATIENT INSTRUCTIONS POST-ANESTHESIA  IMMEDIATELY FOLLOWING SURGERY:  Do not drive or operate machinery for the first twenty four hours after surgery.  Do not make any important decisions for twenty four hours after surgery or while taking narcotic pain medications or sedatives.  If you develop intractable nausea and vomiting or a severe headache please notify your doctor immediately.  FOLLOW-UP:  Please make an appointment with your surgeon as instructed. You do not need to follow up with anesthesia unless specifically instructed to do so.  WOUND CARE INSTRUCTIONS (if applicable):  Keep a dry clean dressing on the anesthesia/puncture wound site if there is drainage.  Once the wound has quit draining you may leave it open to air.  Generally you should leave the bandage intact for twenty four hours unless there is  drainage.  If the epidural site drains for more than 36-48 hours please call the anesthesia department.  QUESTIONS?:  Please feel free to call your physician or the hospital operator if you have any questions, and they will be happy to assist you.

## 2013-03-22 ENCOUNTER — Encounter (HOSPITAL_COMMUNITY): Payer: Self-pay

## 2013-03-22 ENCOUNTER — Encounter (HOSPITAL_COMMUNITY)
Admission: RE | Admit: 2013-03-22 | Discharge: 2013-03-22 | Disposition: A | Discharge status: Home / Self Care | Payer: BC Managed Care – PPO | Source: Ambulatory Visit | Attending: General Surgery | Admitting: General Surgery

## 2013-03-22 DIAGNOSIS — Z01812 Encounter for preprocedural laboratory examination: Secondary | ICD-10-CM | POA: Insufficient documentation

## 2013-03-22 LAB — CBC WITH DIFFERENTIAL/PLATELET
BASOS ABS: 0.1 10*3/uL (ref 0.0–0.1)
Basophils Relative: 1 % (ref 0–1)
Eosinophils Absolute: 0.6 10*3/uL (ref 0.0–0.7)
Eosinophils Relative: 6 % — ABNORMAL HIGH (ref 0–5)
HCT: 43.2 % (ref 36.0–46.0)
Hemoglobin: 14.7 g/dL (ref 12.0–15.0)
LYMPHS PCT: 23 % (ref 12–46)
Lymphs Abs: 2.1 10*3/uL (ref 0.7–4.0)
MCH: 29.2 pg (ref 26.0–34.0)
MCHC: 34 g/dL (ref 30.0–36.0)
MCV: 85.7 fL (ref 78.0–100.0)
Monocytes Absolute: 0.6 10*3/uL (ref 0.1–1.0)
Monocytes Relative: 6 % (ref 3–12)
NEUTROS ABS: 6.1 10*3/uL (ref 1.7–7.7)
Neutrophils Relative %: 64 % (ref 43–77)
PLATELETS: 247 10*3/uL (ref 150–400)
RBC: 5.04 MIL/uL (ref 3.87–5.11)
RDW: 12.9 % (ref 11.5–15.5)
WBC: 9.5 10*3/uL (ref 4.0–10.5)

## 2013-03-22 LAB — BASIC METABOLIC PANEL
BUN: 20 mg/dL (ref 6–23)
CALCIUM: 9.1 mg/dL (ref 8.4–10.5)
CHLORIDE: 106 meq/L (ref 96–112)
CO2: 23 mEq/L (ref 19–32)
CREATININE: 0.76 mg/dL (ref 0.50–1.10)
GFR calc non Af Amer: >90 mL/min (ref >90)
Glucose, Bld: 103 mg/dL — ABNORMAL HIGH (ref 70–99)
Potassium: 4.4 mEq/L (ref 3.7–5.3)
SODIUM: 142 meq/L (ref 137–147)

## 2013-03-22 LAB — HEPATIC FUNCTION PANEL
ALK PHOS: 100 U/L (ref 39–117)
ALT: 19 U/L (ref 0–35)
AST: 16 U/L (ref 0–37)
Albumin: 3.9 g/dL (ref 3.5–5.2)
Total Bilirubin: 0.3 mg/dL (ref 0.3–1.2)
Total Protein: 6.9 g/dL (ref 6.0–8.3)

## 2013-03-22 LAB — HCG, SERUM, QUALITATIVE: PREG SERUM: NEGATIVE

## 2013-03-22 NOTE — Pre-Procedure Instructions (Signed)
Patient given information to sign up for my chart at home. 

## 2013-03-26 ENCOUNTER — Encounter (HOSPITAL_COMMUNITY): Payer: BC Managed Care – PPO | Admitting: Anesthesiology

## 2013-03-26 ENCOUNTER — Encounter (HOSPITAL_COMMUNITY)
Admission: RE | Disposition: A | Discharge status: Home / Self Care | Payer: Self-pay | Source: Ambulatory Visit | Attending: General Surgery

## 2013-03-26 ENCOUNTER — Encounter (HOSPITAL_COMMUNITY): Payer: Self-pay | Admitting: *Deleted

## 2013-03-26 ENCOUNTER — Ambulatory Visit (HOSPITAL_COMMUNITY): Payer: BC Managed Care – PPO | Admitting: Anesthesiology

## 2013-03-26 ENCOUNTER — Ambulatory Visit (HOSPITAL_COMMUNITY)
Admission: RE | Admit: 2013-03-26 | Discharge: 2013-03-26 | Disposition: A | Discharge status: Home / Self Care | Payer: BC Managed Care – PPO | Source: Ambulatory Visit | Attending: General Surgery | Admitting: General Surgery

## 2013-03-26 DIAGNOSIS — IMO0001 Reserved for inherently not codable concepts without codable children: Secondary | ICD-10-CM | POA: Insufficient documentation

## 2013-03-26 DIAGNOSIS — F17200 Nicotine dependence, unspecified, uncomplicated: Secondary | ICD-10-CM | POA: Insufficient documentation

## 2013-03-26 DIAGNOSIS — K811 Chronic cholecystitis: Secondary | ICD-10-CM | POA: Insufficient documentation

## 2013-03-26 HISTORY — PX: CHOLECYSTECTOMY: SHX55

## 2013-03-26 SURGERY — LAPAROSCOPIC CHOLECYSTECTOMY
Anesthesia: General | Site: Abdomen

## 2013-03-26 MED ORDER — POVIDONE-IODINE 10 % EX OINT
TOPICAL_OINTMENT | CUTANEOUS | Status: AC
  Filled 2013-03-26: qty 1

## 2013-03-26 MED ORDER — CLINDAMYCIN PHOSPHATE 900 MG/50ML IV SOLN
INTRAVENOUS | Status: AC
  Filled 2013-03-26: qty 50

## 2013-03-26 MED ORDER — NEOSTIGMINE METHYLSULFATE 1 MG/ML IJ SOLN
INTRAMUSCULAR | Status: DC | PRN
  Administered 2013-03-26: 4 mg via INTRAVENOUS

## 2013-03-26 MED ORDER — MIDAZOLAM HCL 2 MG/2ML IJ SOLN
1.0000 mg | INTRAMUSCULAR | Status: DC | PRN
  Administered 2013-03-26: 2 mg via INTRAVENOUS

## 2013-03-26 MED ORDER — POVIDONE-IODINE 10 % OINT PACKET
TOPICAL_OINTMENT | CUTANEOUS | Status: DC | PRN
  Administered 2013-03-26: 1 via TOPICAL

## 2013-03-26 MED ORDER — FENTANYL CITRATE 0.05 MG/ML IJ SOLN
INTRAMUSCULAR | Status: AC
  Filled 2013-03-26: qty 2

## 2013-03-26 MED ORDER — CHLORHEXIDINE GLUCONATE 4 % EX LIQD: 1.0000 "application " | Freq: Once | CUTANEOUS | Status: DC

## 2013-03-26 MED ORDER — FENTANYL CITRATE 0.05 MG/ML IJ SOLN
INTRAMUSCULAR | Status: AC
  Filled 2013-03-26: qty 5

## 2013-03-26 MED ORDER — SUCCINYLCHOLINE CHLORIDE 20 MG/ML IJ SOLN
INTRAMUSCULAR | Status: DC | PRN
  Administered 2013-03-26: 120 mg via INTRAVENOUS

## 2013-03-26 MED ORDER — GLYCOPYRROLATE 0.2 MG/ML IJ SOLN
INTRAMUSCULAR | Status: AC
  Filled 2013-03-26: qty 5

## 2013-03-26 MED ORDER — GLYCOPYRROLATE 0.2 MG/ML IJ SOLN
INTRAMUSCULAR | Status: AC
  Filled 2013-03-26: qty 1

## 2013-03-26 MED ORDER — ONDANSETRON HCL 4 MG/2ML IJ SOLN: 4.0000 mg | Freq: Once | INTRAMUSCULAR | Status: DC | PRN

## 2013-03-26 MED ORDER — FENTANYL CITRATE 0.05 MG/ML IJ SOLN
INTRAMUSCULAR | Status: DC | PRN
  Administered 2013-03-26 (×10): 50 ug via INTRAVENOUS

## 2013-03-26 MED ORDER — SODIUM CHLORIDE 0.9 % IR SOLN
Status: DC | PRN
  Administered 2013-03-26: 1000 mL

## 2013-03-26 MED ORDER — KETOROLAC TROMETHAMINE 30 MG/ML IJ SOLN
30.0000 mg | Freq: Once | INTRAMUSCULAR | Status: AC
  Administered 2013-03-26: 30 mg via INTRAVENOUS

## 2013-03-26 MED ORDER — ALBUTEROL SULFATE (2.5 MG/3ML) 0.083% IN NEBU
INHALATION_SOLUTION | RESPIRATORY_TRACT | Status: AC
  Filled 2013-03-26: qty 3

## 2013-03-26 MED ORDER — SODIUM CHLORIDE 0.9 % IN NEBU
INHALATION_SOLUTION | RESPIRATORY_TRACT | Status: AC
Start: 2013-03-26 — End: 2013-03-26
  Filled 2013-03-26: qty 3

## 2013-03-26 MED ORDER — PROPOFOL 10 MG/ML IV BOLUS
INTRAVENOUS | Status: DC | PRN
  Administered 2013-03-26: 20 mg via INTRAVENOUS
  Administered 2013-03-26: 200 mg via INTRAVENOUS

## 2013-03-26 MED ORDER — DEXAMETHASONE SODIUM PHOSPHATE 4 MG/ML IJ SOLN
INTRAMUSCULAR | Status: AC
  Filled 2013-03-26: qty 1

## 2013-03-26 MED ORDER — FENTANYL CITRATE 0.05 MG/ML IJ SOLN
25.0000 ug | INTRAMUSCULAR | Status: DC | PRN
  Administered 2013-03-26 (×4): 50 ug via INTRAVENOUS

## 2013-03-26 MED ORDER — HEMOSTATIC AGENTS (NO CHARGE) OPTIME
TOPICAL | Status: DC | PRN
  Administered 2013-03-26: 1 via TOPICAL

## 2013-03-26 MED ORDER — PROPOFOL 10 MG/ML IV EMUL
INTRAVENOUS | Status: AC
  Filled 2013-03-26: qty 20

## 2013-03-26 MED ORDER — HYDROCODONE-ACETAMINOPHEN 5-325 MG PO TABS: 1.0000 | ORAL_TABLET | Freq: Four times a day (QID) | ORAL | Status: DC | PRN

## 2013-03-26 MED ORDER — CLINDAMYCIN PHOSPHATE 900 MG/50ML IV SOLN
900.0000 mg | Freq: Once | INTRAVENOUS | Status: DC
Start: 2013-03-26 — End: 2013-03-26

## 2013-03-26 MED ORDER — ALBUTEROL SULFATE (2.5 MG/3ML) 0.083% IN NEBU
1.2500 mg | INHALATION_SOLUTION | Freq: Once | RESPIRATORY_TRACT | Status: AC
  Administered 2013-03-26: 1.2 mg via RESPIRATORY_TRACT

## 2013-03-26 MED ORDER — BUPIVACAINE HCL (PF) 0.5 % IJ SOLN
INTRAMUSCULAR | Status: AC
  Filled 2013-03-26: qty 30

## 2013-03-26 MED ORDER — DEXAMETHASONE SODIUM PHOSPHATE 4 MG/ML IJ SOLN
4.0000 mg | Freq: Once | INTRAMUSCULAR | Status: AC
  Administered 2013-03-26: 4 mg via INTRAVENOUS

## 2013-03-26 MED ORDER — ONDANSETRON HCL 4 MG/2ML IJ SOLN
INTRAMUSCULAR | Status: AC
  Filled 2013-03-26: qty 2

## 2013-03-26 MED ORDER — GLYCOPYRROLATE 0.2 MG/ML IJ SOLN
0.2000 mg | Freq: Once | INTRAMUSCULAR | Status: AC
  Administered 2013-03-26: 0.2 mg via INTRAVENOUS

## 2013-03-26 MED ORDER — MIDAZOLAM HCL 2 MG/2ML IJ SOLN
INTRAMUSCULAR | Status: AC
  Filled 2013-03-26: qty 2

## 2013-03-26 MED ORDER — KETOROLAC TROMETHAMINE 30 MG/ML IJ SOLN
INTRAMUSCULAR | Status: AC
  Filled 2013-03-26: qty 1

## 2013-03-26 MED ORDER — ROCURONIUM BROMIDE 100 MG/10ML IV SOLN
INTRAVENOUS | Status: DC | PRN
  Administered 2013-03-26: 30 mg via INTRAVENOUS
  Administered 2013-03-26: 5 mg via INTRAVENOUS

## 2013-03-26 MED ORDER — BUPIVACAINE HCL (PF) 0.5 % IJ SOLN
INTRAMUSCULAR | Status: DC | PRN
  Administered 2013-03-26: 10 mL

## 2013-03-26 MED ORDER — LACTATED RINGERS IV SOLN
INTRAVENOUS | Status: DC
  Administered 2013-03-26 (×2): via INTRAVENOUS

## 2013-03-26 MED ORDER — LIDOCAINE HCL 1 % IJ SOLN
INTRAMUSCULAR | Status: DC | PRN
  Administered 2013-03-26: 50 mg via INTRADERMAL

## 2013-03-26 MED ORDER — NEOSTIGMINE METHYLSULFATE 1 MG/ML IJ SOLN
INTRAMUSCULAR | Status: AC
  Filled 2013-03-26: qty 1

## 2013-03-26 MED ORDER — ALBUTEROL SULFATE (2.5 MG/3ML) 0.083% IN NEBU
2.5000 mg | INHALATION_SOLUTION | Freq: Once | RESPIRATORY_TRACT | Status: AC
  Administered 2013-03-26: 2.5 mg via RESPIRATORY_TRACT

## 2013-03-26 MED ORDER — LIDOCAINE HCL (PF) 1 % IJ SOLN
INTRAMUSCULAR | Status: AC
  Filled 2013-03-26: qty 5

## 2013-03-26 MED ORDER — ROCURONIUM BROMIDE 50 MG/5ML IV SOLN
INTRAVENOUS | Status: AC
  Filled 2013-03-26: qty 1

## 2013-03-26 MED ORDER — SUCCINYLCHOLINE CHLORIDE 20 MG/ML IJ SOLN
INTRAMUSCULAR | Status: AC
  Filled 2013-03-26: qty 1

## 2013-03-26 MED ORDER — ONDANSETRON HCL 4 MG/2ML IJ SOLN
4.0000 mg | Freq: Once | INTRAMUSCULAR | Status: AC
  Administered 2013-03-26: 4 mg via INTRAVENOUS

## 2013-03-26 MED ORDER — GLYCOPYRROLATE 0.2 MG/ML IJ SOLN
INTRAMUSCULAR | Status: DC | PRN
  Administered 2013-03-26: .6 mg via INTRAVENOUS
  Administered 2013-03-26: .2 mg via INTRAVENOUS

## 2013-03-26 SURGICAL SUPPLY — 46 items
APPLIER CLIP LAPSCP 10X32 DD (CLIP) ×3 IMPLANT
BAG HAMPER (MISCELLANEOUS) ×3 IMPLANT
BAG SPEC RTRVL LRG 6X4 10 (ENDOMECHANICALS) ×1
CLOTH BEACON ORANGE TIMEOUT ST (SAFETY) ×3 IMPLANT
COVER LIGHT HANDLE STERIS (MISCELLANEOUS) ×6 IMPLANT
DECANTER SPIKE VIAL GLASS SM (MISCELLANEOUS) ×3 IMPLANT
DURAPREP 26ML APPLICATOR (WOUND CARE) ×3 IMPLANT
ELECT REM PT RETURN 9FT ADLT (ELECTROSURGICAL) ×3
ELECTRODE REM PT RTRN 9FT ADLT (ELECTROSURGICAL) ×1 IMPLANT
FILTER SMOKE EVAC LAPAROSHD (FILTER) ×3 IMPLANT
FORMALIN 10 PREFIL 120ML (MISCELLANEOUS) ×3 IMPLANT
GLOVE BIO SURGEON STRL SZ7.5 (GLOVE) ×3 IMPLANT
GLOVE BIOGEL PI IND STRL 8 (GLOVE) ×1 IMPLANT
GLOVE BIOGEL PI INDICATOR 8 (GLOVE) ×2
GLOVE ECLIPSE 6.5 STRL STRAW (GLOVE) ×2 IMPLANT
GLOVE ECLIPSE 8.5 STRL (GLOVE) ×2 IMPLANT
GLOVE INDICATOR 7.0 STRL GRN (GLOVE) ×4 IMPLANT
GLOVE INDICATOR 8.5 STRL (GLOVE) ×2 IMPLANT
GLOVE SS BIOGEL STRL SZ 6.5 (GLOVE) IMPLANT
GLOVE SUPERSENSE BIOGEL SZ 6.5 (GLOVE) ×2
GOWN STRL REUS W/TWL LRG LVL3 (GOWN DISPOSABLE) ×9 IMPLANT
HEMOSTAT SNOW SURGICEL 2X4 (HEMOSTASIS) ×3 IMPLANT
INST SET LAPROSCOPIC AP (KITS) ×3 IMPLANT
IV NS IRRIG 3000ML ARTHROMATIC (IV SOLUTION) IMPLANT
KIT ROOM TURNOVER APOR (KITS) ×3 IMPLANT
MANIFOLD NEPTUNE II (INSTRUMENTS) ×3 IMPLANT
NDL INSUFFLATION 14GA 120MM (NEEDLE) ×1 IMPLANT
NEEDLE INSUFFLATION 14GA 120MM (NEEDLE) ×3 IMPLANT
NS IRRIG 1000ML POUR BTL (IV SOLUTION) ×3 IMPLANT
PACK LAP CHOLE LZT030E (CUSTOM PROCEDURE TRAY) ×3 IMPLANT
PAD ARMBOARD 7.5X6 YLW CONV (MISCELLANEOUS) ×3 IMPLANT
POUCH SPECIMEN RETRIEVAL 10MM (ENDOMECHANICALS) ×3 IMPLANT
SET BASIN LINEN APH (SET/KITS/TRAYS/PACK) ×3 IMPLANT
SET TUBE IRRIG SUCTION NO TIP (IRRIGATION / IRRIGATOR) IMPLANT
SLEEVE ENDOPATH XCEL 5M (ENDOMECHANICALS) ×3 IMPLANT
SPONGE GAUZE 2X2 8PLY STER LF (GAUZE/BANDAGES/DRESSINGS) ×4
SPONGE GAUZE 2X2 8PLY STRL LF (GAUZE/BANDAGES/DRESSINGS) ×8 IMPLANT
STAPLER VISISTAT (STAPLE) ×3 IMPLANT
SUT VICRYL 0 UR6 27IN ABS (SUTURE) ×3 IMPLANT
TAPE CLOTH SURG 4X10 WHT LF (GAUZE/BANDAGES/DRESSINGS) ×2 IMPLANT
TROCAR ENDO BLADELESS 11MM (ENDOMECHANICALS) ×3 IMPLANT
TROCAR XCEL NON-BLD 5MMX100MML (ENDOMECHANICALS) ×3 IMPLANT
TROCAR XCEL UNIV SLVE 11M 100M (ENDOMECHANICALS) ×3 IMPLANT
TUBING INSUFFLATION (TUBING) ×3 IMPLANT
WARMER LAPAROSCOPE (MISCELLANEOUS) ×3 IMPLANT
YANKAUER SUCT 12FT TUBE ARGYLE (SUCTIONS) ×3 IMPLANT

## 2013-03-26 NOTE — Anesthesia Procedure Notes (Signed)
Procedure Name: Intubation Date/Time: 03/26/2013 7:40 AM Performed by: Glynn OctaveANIEL, Alexandra Posadas E Pre-anesthesia Checklist: Patient identified, Patient being monitored, Timeout performed, Emergency Drugs available and Suction available Patient Re-evaluated:Patient Re-evaluated prior to inductionOxygen Delivery Method: Circle System Utilized Preoxygenation: Pre-oxygenation with 100% oxygen Intubation Type: IV induction, Rapid sequence and Cricoid Pressure applied Ventilation: Mask ventilation without difficulty Laryngoscope Size: Mac and 3 Grade View: Grade I Tube type: Oral Tube size: 7.0 mm Number of attempts: 1 Airway Equipment and Method: stylet Placement Confirmation: ETT inserted through vocal cords under direct vision,  positive ETCO2 and breath sounds checked- equal and bilateral Secured at: 21 cm Tube secured with: Tape Dental Injury: Teeth and Oropharynx as per pre-operative assessment

## 2013-03-26 NOTE — Discharge Instructions (Signed)
Laparoscopic Cholecystectomy, Care After °Refer to this sheet in the next few weeks. These instructions provide you with information on caring for yourself after your procedure. Your health care provider may also give you more specific instructions. Your treatment has been planned according to current medical practices, but problems sometimes occur. Call your health care provider if you have any problems or questions after your procedure. °WHAT TO EXPECT AFTER THE PROCEDURE °After your procedure, it is typical to have the following: °· Pain at your incision sites. You will be given pain medicines to control the pain. °· Mild nausea or vomiting. This should improve after the first 24 hours. °· Bloating and possibly shoulder pain from the gas used during the procedure. This will improve after the first 24 hours. °HOME CARE INSTRUCTIONS  °· Change bandages (dressings) as directed by your health care provider. °· Keep the wound dry and clean. You may wash the wound gently with soap and water. Gently blot or dab the area dry. °· Do not take baths or use swimming pools or hot tubs for 2 weeks or until your health care provider approves. °· Only take over-the-counter or prescription medicines as directed by your health care provider. °· Continue your normal diet as directed by your health care provider. °· Do not lift anything heavier than 10 pounds (4.5 kg) until your health care provider approves. °· Do not play contact sports for 1 week or until your health care provider approves. °SEEK MEDICAL CARE IF:  °· You have redness, swelling, or increasing pain in the wound. °· You notice yellowish-white fluid (pus) coming from the wound. °· You have drainage from the wound that lasts longer than 1 day. °· You notice a bad smell coming from the wound or dressing. °· Your surgical cuts (incisions) break open. °SEEK IMMEDIATE MEDICAL CARE IF:  °· You develop a rash. °· You have difficulty breathing. °· You have chest pain. °· You  have a fever. °· You have increasing pain in the shoulders (shoulder strap areas). °· You have dizzy episodes or faint while standing. °· You have severe abdominal pain. °· You feel sick to your stomach (nauseous) or throw up (vomit) and this lasts for more than 1 day. °Document Released: 12/31/2004 Document Revised: 10/21/2012 Document Reviewed: 08/12/2012 °[employer]® Patient Information ©2014 [employer]. ° °

## 2013-03-26 NOTE — Interval H&P Note (Signed)
History and Physical Interval Note:  03/26/2013 7:22 AM  [name] [name]  has presented today for surgery, with the diagnosis of chronic cholecystitis  The various methods of treatment have been discussed with the patient and family. After consideration of risks, benefits and other options for treatment, the patient has consented to  Procedure(s): LAPAROSCOPIC CHOLECYSTECTOMY (N/A) as a surgical intervention .  The patient's history has been reviewed, patient examined, no change in status, stable for surgery.  I have reviewed the patient's chart and labs.  Questions were answered to the patient's satisfaction.     Franky MachoJENKINS,[name] A

## 2013-03-26 NOTE — Anesthesia Postprocedure Evaluation (Signed)
  Anesthesia Post-op Note  Patient: [name] [name]  Procedure(s) Performed: Procedure(s): LAPAROSCOPIC CHOLECYSTECTOMY (N/A)  Patient Location: PACU  Anesthesia Type:General  Level of Consciousness: awake, alert  and oriented  Airway and Oxygen Therapy: Patient Spontanous Breathing and Patient connected to face mask oxygen  Post-op Pain: mild  Post-op Assessment: Post-op Vital signs reviewed, Patient's Cardiovascular Status Stable, Respiratory Function Stable, Patent Airway and No signs of Nausea or vomiting  Post-op Vital Signs: Reviewed and stable  Complications: No apparent anesthesia complications

## 2013-03-26 NOTE — Transfer of Care (Signed)
Immediate Anesthesia Transfer of Care Note  Patient: [name] JeffersonAmber L [name]  Procedure(s) Performed: Procedure(s): LAPAROSCOPIC CHOLECYSTECTOMY (N/A)  Patient Location: PACU  Anesthesia Type:General  Level of Consciousness: awake, alert  and oriented  Airway & Oxygen Therapy: Patient Spontanous Breathing and Patient connected to face mask oxygen,  Patient congested when emerged from anesthesia, slight crowing.  Able to maintain sats, neb treatment started in PACU  Post-op Assessment: Report given to PACU RN  Post vital signs: Reviewed  Complications: No apparent anesthesia complications

## 2013-03-26 NOTE — Interval H&P Note (Signed)
History and Physical Interval Note:  03/26/2013 7:22 AM  [name] [name]  has presented today for surgery, with the diagnosis of chronic cholecystitis  The various methods of treatment have been discussed with the patient and family. After consideration of risks, benefits and other options for treatment, the patient has consented to  Procedure(s): LAPAROSCOPIC CHOLECYSTECTOMY (N/A) as a surgical intervention .  The patient's history has been reviewed, patient examined, no change in status, stable for surgery.  I have reviewed the patient's chart and labs.  Questions were answered to the patient's satisfaction.     [name]   

## 2013-03-26 NOTE — Anesthesia Preprocedure Evaluation (Signed)
Anesthesia Evaluation  Patient identified by MRN, date of birth, ID band Patient awake    Reviewed: Allergy & Precautions, H&P , NPO status , Patient's Chart, lab work & pertinent test results  Airway Mallampati: II TM Distance: >3 FB Neck ROM: Full    Dental  (+) Teeth Intact   Pulmonary asthma , Current Smoker,  breath sounds clear to auscultation        Cardiovascular negative cardio ROS  Rhythm:Regular     Neuro/Psych PSYCHIATRIC DISORDERS (chronic pain syndrome)  Neuromuscular disease    GI/Hepatic GERD-  Medicated,  Endo/Other    Renal/GU Renal disease (ureteral stone)     Musculoskeletal  (+) Fibromyalgia -  Abdominal   Peds  Hematology   Anesthesia Other Findings   Reproductive/Obstetrics                           Anesthesia Physical Anesthesia Plan  ASA: II  Anesthesia Plan: General   Post-op Pain Management:    Induction: Intravenous, Rapid sequence and Cricoid pressure planned  Airway Management Planned: Oral ETT  Additional Equipment:   Intra-op Plan:   Post-operative Plan: Extubation in OR  Informed Consent: I have reviewed the patients History and Physical, chart, labs and discussed the procedure including the risks, benefits and alternatives for the proposed anesthesia with the patient or authorized representative who has indicated his/her understanding and acceptance.     Plan Discussed with:   Anesthesia Plan Comments:         Anesthesia Quick Evaluation

## 2013-03-26 NOTE — Op Note (Signed)
Patient:  [name] [name]  DOB:  1990-05-03  MRN:  [id number]   Preop Diagnosis:  Chronic cholecystitis  Postop Diagnosis:  Same  Procedure:  Laparoscopic cholecystectomy  Surgeon:  Franky MachoMark Dyasia Firestine, M.D.  Anes:  General endotracheal  Indications:  Patient is a 23 year old white female who presents with biliary colic secondary to chronic cholecystitis. The risks and benefits of the procedure including bleeding, infection, hepatobiliary, and the possibility of an open procedure were fully explained to the patient, who gave informed consent.  Procedure note:  The patient was placed the supine position. After induction of general endotracheal anesthesia, the abdomen was prepped and draped using usual sterile technique with DuraPrep. Surgical site confirmation was performed.  A supraumbilical incision was made down to the fascia. A Veress needle was introduced into the abdominal cavity and confirmation of placement was done using the saline drop test. The abdomen was then insufflated to 16 mm mercury pressure. An 11 mm trocar was introduced into the abdominal cavity under direct visualization the difficulty. The patient was placed in reverse Trendelenburg position and additional 11 mm trocar was placed the epigastric region and 5 mm trochars were placed were upper quadrant and right flank regions. Liver was inspected and noted within normal limits. Gallbladder was retracted in a dynamic fashion in order to expose the triangle of Calot.  The cystic duct was first identified. Its junction to the infundibulum was fully identified. Endoclips placed proximally and distally on the cystic duct, and the cystic duct was divided. This was likewise done cystic artery. The gallbladder was then freed away from the gallbladder fossa using Bovie electrocautery. The gallbladder was delivered through the trocar site using an Endo Catch bag. The gallbladder fossa was inspected and no abnormal bleeding or bile leakage was  noted. Surgicel was placed the gallbladder fossa. All fluid and air were then evacuated from the abdominal cavity prior to removal of the trochars.  All wounds were irrigated with normal saline. All wounds were injected with 0.5% Sensorcaine. The supraumbilical fascia was reapproximated using 0 Vicryl interrupted suture. All skin incisions were closed using staples. Betadine ointment and dry sterile dressings were applied.  All tape and needle counts were correct at the end of the procedure. Patient was extubated in the operating room and transferred to PACU in stable condition.  Complications:  None  EBL:  Minimal  Specimen:  Gallbladder

## 2013-03-29 ENCOUNTER — Encounter (HOSPITAL_COMMUNITY): Payer: Self-pay | Admitting: General Surgery

## 2013-04-19 ENCOUNTER — Ambulatory Visit (INDEPENDENT_AMBULATORY_CARE_PROVIDER_SITE_OTHER): Payer: BC Managed Care – PPO | Admitting: Family Medicine

## 2013-04-19 ENCOUNTER — Encounter: Payer: Self-pay | Admitting: Family Medicine

## 2013-04-19 VITALS — BP 118/72 | Temp 97.9°F | Ht 60.5 in | Wt 272.0 lb

## 2013-04-19 DIAGNOSIS — X088XXA Exposure to other specified smoke, fire and flames, initial encounter: Secondary | ICD-10-CM

## 2013-04-19 DIAGNOSIS — R062 Wheezing: Secondary | ICD-10-CM

## 2013-04-19 DIAGNOSIS — J309 Allergic rhinitis, unspecified: Secondary | ICD-10-CM

## 2013-04-19 MED ORDER — ALBUTEROL SULFATE HFA 108 (90 BASE) MCG/ACT IN AERS: 2.0000 | INHALATION_SPRAY | RESPIRATORY_TRACT | Status: DC | PRN

## 2013-04-19 MED ORDER — PREDNISONE 20 MG PO TABS: ORAL_TABLET | ORAL | Status: AC
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
## 2013-04-20 ENCOUNTER — Telehealth: Payer: Self-pay | Admitting: Family Medicine

## 2013-04-20 MED ORDER — CEFPROZIL 500 MG PO TABS: 500.0000 mg | ORAL_TABLET | Freq: Two times a day (BID) | ORAL | Status: DC

## 2013-04-20 NOTE — Telephone Encounter (Signed)
Patient states that she is coughing and grayish colored mucous comes up with it now. No other symptoms. Was given albuterol inhaler and prednisone yesterday.

## 2013-04-20 NOTE — Telephone Encounter (Signed)
May add Cefzil 500 mg 1 twice a day for the next 10 days, followup if progressive coughing/shortness of breath or high fevers

## 2013-04-20 NOTE — Telephone Encounter (Signed)
Medication was sent in to pharmacy. Patient was notified.

## 2013-04-20 NOTE — Telephone Encounter (Signed)
Patient was told to call back today if she started coughing up mucous. She said she is coughing up greenish mucous.  [facility]

## 2013-04-27 ENCOUNTER — Ambulatory Visit (INDEPENDENT_AMBULATORY_CARE_PROVIDER_SITE_OTHER): Payer: BC Managed Care – PPO | Admitting: *Deleted

## 2013-04-27 DIAGNOSIS — IMO0001 Reserved for inherently not codable concepts without codable children: Secondary | ICD-10-CM

## 2013-04-27 DIAGNOSIS — Z309 Encounter for contraceptive management, unspecified: Secondary | ICD-10-CM

## 2013-04-27 MED ORDER — MEDROXYPROGESTERONE ACETATE 150 MG/ML IM SUSP
150.0000 mg | Freq: Once | INTRAMUSCULAR | Status: AC
  Administered 2013-04-27: 150 mg via INTRAMUSCULAR

## 2013-07-08 ENCOUNTER — Telehealth: Payer: Self-pay | Admitting: Family Medicine

## 2013-07-08 MED ORDER — IVERMECTIN 0.5 % EX LOTN: TOPICAL_LOTION | CUTANEOUS | Status: DC

## 2013-07-08 NOTE — Telephone Encounter (Signed)
Patient has lice and OTC meds are not working. She would like something called in.   [facility]

## 2013-07-08 NOTE — Telephone Encounter (Signed)
Verified with patient that it is lice she is experiencing. I then sent in [facility] per proto call.

## 2013-07-19 ENCOUNTER — Encounter: Payer: Self-pay | Admitting: Nurse Practitioner

## 2013-07-19 ENCOUNTER — Ambulatory Visit (INDEPENDENT_AMBULATORY_CARE_PROVIDER_SITE_OTHER): Payer: BC Managed Care – PPO | Admitting: Nurse Practitioner

## 2013-07-19 VITALS — BP 122/88 | Ht 60.5 in | Wt 261.0 lb

## 2013-07-19 DIAGNOSIS — R5381 Other malaise: Secondary | ICD-10-CM

## 2013-07-19 DIAGNOSIS — R5383 Other fatigue: Secondary | ICD-10-CM

## 2013-07-19 DIAGNOSIS — M62838 Other muscle spasm: Secondary | ICD-10-CM

## 2013-07-19 DIAGNOSIS — L83 Acanthosis nigricans: Secondary | ICD-10-CM

## 2013-07-19 DIAGNOSIS — Z0189 Encounter for other specified special examinations: Secondary | ICD-10-CM

## 2013-07-19 DIAGNOSIS — Z79899 Other long term (current) drug therapy: Secondary | ICD-10-CM

## 2013-07-19 DIAGNOSIS — Z13220 Encounter for screening for lipoid disorders: Secondary | ICD-10-CM

## 2013-07-19 MED ORDER — CYCLOBENZAPRINE HCL 10 MG PO TABS: 10.0000 mg | ORAL_TABLET | Freq: Three times a day (TID) | ORAL | Status: DC | PRN

## 2013-07-19 MED ORDER — IBUPROFEN 800 MG PO TABS: 800.0000 mg | ORAL_TABLET | Freq: Three times a day (TID) | ORAL | Status: DC | PRN

## 2013-07-22 ENCOUNTER — Encounter: Payer: Self-pay | Admitting: Nurse Practitioner

## 2013-07-22 NOTE — Progress Notes (Signed)
Subjective:  Presents for recheck. Low back pain is stable at this time. Having pain in the upper back and neck area. Worse with certain movements. No numbness or weakness. Frequent muscle spasms. Has TENS unit at home. Was seen by pain management but stopped because she wanted to limit use of narcotic pain med. Was taking large amounts at one point but has been able to stop on her own.   Objective:   BP 122/88  Ht 5' 0.5" (1.537 m)  Wt 261 lb (118.389 kg)  BMI 50.11 kg/m2 NAD. Alert, oriented. Lungs clear. Heart RRR. Acanthosis nigricans noted on posterior neck area. Very tight tender muscles noted in cervical, trapezius and upper back area. Good ROM of neck and shoulders with some tenderness. Hand and arm strength 5 + bilat. Sensation grossly intact. Strong radial pulses.  Assessment:  Muscle spasms of head and/or neck  Other malaise and fatigue - Plan: TSH, Insulin, fasting  Acanthosis nigricans  Encounter for long-term (current) use of other medications - Plan: Hepatic function panel  Screening for lipoid disorders - Plan: Lipid panel  Other specified examination - Plan: Basic metabolic panel  Plan:  Meds ordered this encounter  Medications  . NAPROXEN PO    Sig: Take by mouth.  . cyclobenzaprine (FLEXERIL) 10 MG tablet    Sig: Take 1 tablet (10 mg total) by mouth 3 (three) times daily as needed for muscle spasms.    Dispense:  30 tablet    Refill:  0    Order Specific Question:  Supervising Provider    Answer:  Merlyn AlbertLUKING, WILLIAM S [[id number]]  . ibuprofen (ADVIL,MOTRIN) 800 MG tablet    Sig: Take 1 tablet (800 mg total) by mouth every 8 (eight) hours as needed for mild pain.    Dispense:  30 tablet    Refill:  0    Order Specific Question:  Supervising Provider    Answer:  Merlyn AlbertLUKING, WILLIAM S [[id number]]   Restart use of TENS unit as directed. Massage therapy. Stretching exercises. Call back if worsens or persists.

## 2013-07-26 ENCOUNTER — Telehealth: Payer: Self-pay

## 2013-07-26 NOTE — Telephone Encounter (Signed)
Talked about this at last visit. Patient states she has always had cyst. Having back pain where cyst is located. But not sure if pain is coming from cyst or something else.

## 2013-07-26 NOTE — Telephone Encounter (Signed)
Message copied by Dereck LigasJOHNSON, Jayke Caul P on Mon Jul 26, 2013  8:26 AM ------      Message from: Campbell RichesHOSKINS, CAROLYN C      Created: Fri Jul 23, 2013  1:56 PM       Patient asked for a referral back to a specialist for a "cyst in her neck". I cannot find a specific note on this. Can someone talk to patient and find out more details? I will be happy to refer just need more information. Thanks! ------

## 2013-07-28 NOTE — Telephone Encounter (Signed)
Enid DerryBrendale,  This patient has seen Dr. Lovell SheehanJenkins in the past for her back; this seems to be the appropriate referral.

## 2013-07-29 ENCOUNTER — Other Ambulatory Visit: Payer: Self-pay | Admitting: Nurse Practitioner

## 2013-07-29 DIAGNOSIS — G950 Syringomyelia and syringobulbia: Secondary | ICD-10-CM

## 2013-07-29 DIAGNOSIS — M549 Dorsalgia, unspecified: Secondary | ICD-10-CM

## 2013-07-29 NOTE — Telephone Encounter (Signed)
Please initiate referral in system so that I may process °

## 2013-09-17 ENCOUNTER — Other Ambulatory Visit (HOSPITAL_COMMUNITY): Payer: Self-pay | Admitting: Neurosurgery

## 2013-09-17 DIAGNOSIS — G950 Syringomyelia and syringobulbia: Secondary | ICD-10-CM

## 2013-09-17 DIAGNOSIS — M542 Cervicalgia: Secondary | ICD-10-CM

## 2013-09-23 ENCOUNTER — Other Ambulatory Visit (HOSPITAL_COMMUNITY): Payer: Self-pay | Admitting: Neurosurgery

## 2013-09-23 ENCOUNTER — Ambulatory Visit (HOSPITAL_COMMUNITY)
Admission: RE | Admit: 2013-09-23 | Discharge: 2013-09-23 | Disposition: A | Discharge status: Home / Self Care | Payer: BC Managed Care – PPO | Source: Ambulatory Visit | Attending: Neurosurgery | Admitting: Neurosurgery

## 2013-09-23 DIAGNOSIS — M546 Pain in thoracic spine: Secondary | ICD-10-CM | POA: Diagnosis not present

## 2013-09-23 DIAGNOSIS — M542 Cervicalgia: Secondary | ICD-10-CM

## 2013-09-23 DIAGNOSIS — G950 Syringomyelia and syringobulbia: Secondary | ICD-10-CM | POA: Insufficient documentation

## 2013-10-13 ENCOUNTER — Ambulatory Visit (INDEPENDENT_AMBULATORY_CARE_PROVIDER_SITE_OTHER): Payer: BC Managed Care – PPO | Admitting: Family Medicine

## 2013-10-13 ENCOUNTER — Encounter: Payer: Self-pay | Admitting: Family Medicine

## 2013-10-13 VITALS — BP 110/74 | Ht 60.0 in | Wt 260.0 lb

## 2013-10-13 DIAGNOSIS — R21 Rash and other nonspecific skin eruption: Secondary | ICD-10-CM

## 2013-10-13 MED ORDER — PREDNISONE 20 MG PO TABS: ORAL_TABLET | ORAL | Status: DC

## 2013-10-13 MED ORDER — HYDROCODONE-ACETAMINOPHEN 5-325 MG PO TABS: 1.0000 | ORAL_TABLET | Freq: Four times a day (QID) | ORAL | Status: DC | PRN

## 2013-10-13 NOTE — Progress Notes (Signed)
   Subjective:    Patient ID: [name] [name], female    DOB: [birth year], 23 y.o.   MRN: [id number]  HPIStung by jellow jacket 2 days ago on left arm. Having swelling, itching and pain. Using benadryl and cold compresses.   No headache no chest pain with this. No swelling in the throat.  No rash elsewhere.  Impressive swelling and pain and itching at the site of the sting.  Review of Systems Otherwise negative    Objective:   Physical Exam  Alert HEENT normal. Lungs clear. Heart rare in rhythm. Localized impressive swelling erythema itching pruritus tenderness.      Assessment & Plan:  Impression severe local reaction to sting. Plan not enough for allergy workup. Not enough for EpiPen. Definitely avoidance encourage. Prednisone taper given. WSL

## 2013-10-27 ENCOUNTER — Ambulatory Visit (INDEPENDENT_AMBULATORY_CARE_PROVIDER_SITE_OTHER): Payer: BC Managed Care – PPO | Admitting: Nurse Practitioner

## 2013-10-27 ENCOUNTER — Encounter: Payer: Self-pay | Admitting: Nurse Practitioner

## 2013-10-27 ENCOUNTER — Telehealth: Payer: Self-pay | Admitting: Nurse Practitioner

## 2013-10-27 VITALS — BP 126/88 | Ht 60.0 in | Wt 263.0 lb

## 2013-10-27 DIAGNOSIS — M797 Fibromyalgia: Secondary | ICD-10-CM

## 2013-10-27 DIAGNOSIS — M549 Dorsalgia, unspecified: Secondary | ICD-10-CM

## 2013-10-27 DIAGNOSIS — Z23 Encounter for immunization: Secondary | ICD-10-CM

## 2013-10-27 DIAGNOSIS — Z30013 Encounter for initial prescription of injectable contraceptive: Secondary | ICD-10-CM

## 2013-10-27 DIAGNOSIS — G8929 Other chronic pain: Secondary | ICD-10-CM

## 2013-10-27 DIAGNOSIS — Z7189 Other specified counseling: Secondary | ICD-10-CM

## 2013-10-27 LAB — POCT URINE PREGNANCY: Preg Test, Ur: NEGATIVE

## 2013-10-27 MED ORDER — HYDROCODONE-ACETAMINOPHEN 5-325 MG PO TABS: 1.0000 | ORAL_TABLET | Freq: Four times a day (QID) | ORAL | Status: DC | PRN

## 2013-10-27 MED ORDER — MEDROXYPROGESTERONE ACETATE 150 MG/ML IM SUSP
150.0000 mg | Freq: Once | INTRAMUSCULAR | Status: AC
  Administered 2013-10-27: 150 mg via INTRAMUSCULAR

## 2013-10-27 MED ORDER — ALBUTEROL SULFATE HFA 108 (90 BASE) MCG/ACT IN AERS: 2.0000 | INHALATION_SPRAY | RESPIRATORY_TRACT | Status: DC | PRN

## 2013-10-27 MED ORDER — AMITRIPTYLINE HCL 10 MG PO TABS: ORAL_TABLET | ORAL | Status: DC

## 2013-10-27 MED ORDER — CYCLOBENZAPRINE HCL 10 MG PO TABS: 10.0000 mg | ORAL_TABLET | Freq: Three times a day (TID) | ORAL | Status: DC | PRN

## 2013-10-27 NOTE — Telephone Encounter (Signed)
Pt would like to know how to be tested for the specific gene that is causing her  Grandmothers cancer.   Advised her to speak with her insurance about whether they will cover the testing.   Please advise pt

## 2013-10-27 NOTE — Progress Notes (Signed)
Subjective:  Presents for routine followup. No further interventions from neurosurgery. Now is chronic pain management. Patient will be losing her insurance in a few months. Would like to switch to less expensive medications. Requesting to restart her Depo-Provera, has been off her 2 months, no menses. No sexual activity for over 6 months. At one time was taking pain medication on a daily basis, developed tolerance over time with constant increase of medication. At the very and was taking oxycodone 10 mg 6 times a day. Patient wants to avoid taking excessive amounts of narcotics. Needs backup pain medicine about 3/7 days of the week for what she calls "bad days". Usually needs pain medicine 3-4 times a day on those days.  Objective:   BP 126/88  Ht 5' (1.524 m)  Wt 263 lb (119.296 kg)  BMI 51.36 kg/m2 NAD. Alert, oriented. Lungs clear. Heart regular rate rhythm.  Assessment:  Problem List Items Addressed This Visit     Musculoskeletal and Integument   Fibromyalgia - Primary (Chronic)     Other   Chronic back pain (Chronic)   Relevant Medications      cyclobenzaprine (FLEXERIL) tablet      HYDROcodone-acetaminophen (NORCO/VICODIN) 5-325 MG per tablet    Other Visit Diagnoses   Encounter for chronic pain management        Encounter for initial prescription of injectable contraceptive        Relevant Medications       medroxyPROGESTERone (DEPO-PROVERA) injection 150 mg (Completed)    Other Relevant Orders       POCT urine pregnancy (Completed)    Encounter for immunization          Plan: Meds ordered this encounter  Medications  . albuterol (PROVENTIL HFA;VENTOLIN HFA) 108 (90 BASE) MCG/ACT inhaler    Sig: Inhale 2 puffs into the lungs every 4 (four) hours as needed for wheezing or shortness of breath.    Dispense:  1 Inhaler    Refill:  4    Order Specific Question:  Supervising Provider    Answer:  Merlyn AlbertLUKING, WILLIAM S [[id number]]  . cyclobenzaprine (FLEXERIL) 10 MG tablet    Sig:  Take 1 tablet (10 mg total) by mouth 3 (three) times daily as needed for muscle spasms.    Dispense:  90 tablet    Refill:  2    Order Specific Question:  Supervising Provider    Answer:  Merlyn AlbertLUKING, WILLIAM S [[id number]]  . amitriptyline (ELAVIL) 10 MG tablet    Sig: One po qhs x 1 week; if tolerated increase to 2 po qhs    Dispense:  60 tablet    Refill:  2    Order Specific Question:  Supervising Provider    Answer:  Merlyn AlbertLUKING, WILLIAM S [[id number]]  . DISCONTD: HYDROcodone-acetaminophen (NORCO/VICODIN) 5-325 MG per tablet    Sig: Take 1 tablet by mouth every 6 (six) hours as needed. Caution drowsiness    Dispense:  48 tablet    Refill:  0    Order Specific Question:  Supervising Provider    Answer:  Merlyn AlbertLUKING, WILLIAM S [[id number]]  . DISCONTD: HYDROcodone-acetaminophen (NORCO/VICODIN) 5-325 MG per tablet    Sig: Take 1 tablet by mouth every 6 (six) hours as needed. Caution drowsiness    Dispense:  48 tablet    Refill:  0    May fill 30 days from 10/27/13    Order Specific Question:  Supervising Provider    Answer:  Merlyn AlbertLUKING, WILLIAM S [[id number]]  . HYDROcodone-acetaminophen (  NORCO/VICODIN) 5-325 MG per tablet    Sig: Take 1 tablet by mouth every 6 (six) hours as needed. Caution drowsiness    Dispense:  48 tablet    Refill:  0    May fill 60 days from 10/27/13    Order Specific Question:  Supervising Provider    Answer:  Merlyn AlbertLUKING, WILLIAM S [[id number]]  . medroxyPROGESTERone (DEPO-PROVERA) injection 150 mg    Sig:    Has taken Flexeril 3 times a day without difficulty in the past, denies excessive drowsiness. Trial of amitriptyline, note patient was on Lyrica up until recently, will not restart this due to cost. Limit hydrocodone to no more than 48 per month, avoid taking daily only for severe pain. Flu vaccine today. Return in about 3 months (around 01/27/2014).

## 2013-10-27 NOTE — Telephone Encounter (Signed)
Need to know type of cancer. Also, our experience has been that is difficult to get testing paid for; sometimes specialist can do this especially if there is a family history of this type of cancer.

## 2013-10-28 NOTE — Telephone Encounter (Signed)
To my knowledge would most likely be BRCA1 and BRCA2 genetic testing. Another option would be referral to oncology to see if they can get it paid for.

## 2013-10-28 NOTE — Telephone Encounter (Signed)
Ovarian cancer. Pt wants order and then she will call insurance company to see if they pay for that test.

## 2013-10-28 NOTE — Telephone Encounter (Signed)
Discussed with pt. She will call insurance company and let us know what she wants us to do.

## 2013-11-15 ENCOUNTER — Encounter: Payer: Self-pay | Admitting: Nurse Practitioner

## 2014-01-12 ENCOUNTER — Ambulatory Visit (INDEPENDENT_AMBULATORY_CARE_PROVIDER_SITE_OTHER): Payer: BC Managed Care – PPO

## 2014-01-12 DIAGNOSIS — Z308 Encounter for other contraceptive management: Secondary | ICD-10-CM

## 2014-01-12 DIAGNOSIS — Z30013 Encounter for initial prescription of injectable contraceptive: Secondary | ICD-10-CM

## 2014-01-12 MED ORDER — MEDROXYPROGESTERONE ACETATE 150 MG/ML IM SUSP
150.0000 mg | Freq: Once | INTRAMUSCULAR | Status: AC
  Administered 2014-01-12: 150 mg via INTRAMUSCULAR

## 2014-01-24 ENCOUNTER — Ambulatory Visit (INDEPENDENT_AMBULATORY_CARE_PROVIDER_SITE_OTHER): Payer: BLUE CROSS/BLUE SHIELD | Admitting: Nurse Practitioner

## 2014-01-24 ENCOUNTER — Encounter: Payer: Self-pay | Admitting: Nurse Practitioner

## 2014-01-24 VITALS — BP 122/84 | Ht 60.0 in | Wt 262.4 lb

## 2014-01-24 DIAGNOSIS — M797 Fibromyalgia: Secondary | ICD-10-CM

## 2014-01-24 DIAGNOSIS — G8929 Other chronic pain: Secondary | ICD-10-CM

## 2014-01-24 DIAGNOSIS — M549 Dorsalgia, unspecified: Secondary | ICD-10-CM

## 2014-01-24 MED ORDER — HYDROCODONE-ACETAMINOPHEN 5-325 MG PO TABS: 1.0000 | ORAL_TABLET | Freq: Four times a day (QID) | ORAL | Status: DC | PRN

## 2014-01-24 MED ORDER — CYCLOBENZAPRINE HCL 10 MG PO TABS: 10.0000 mg | ORAL_TABLET | Freq: Three times a day (TID) | ORAL | Status: DC | PRN

## 2014-01-24 MED ORDER — GABAPENTIN 300 MG PO CAPS: 300.0000 mg | ORAL_CAPSULE | Freq: Two times a day (BID) | ORAL | Status: DC

## 2014-01-24 NOTE — Patient Instructions (Addendum)
Lactic acid, salicylic acid or topical urea are good for keratosis pilaris cetaphil for daily use

## 2014-01-25 ENCOUNTER — Encounter: Payer: Self-pay | Admitting: Nurse Practitioner

## 2014-01-25 NOTE — Progress Notes (Signed)
Subjective:  Presents for routine follow up. Could not take Amitriptyline due to extreme drowsiness even at 10 mg. Takes her hydrocodone at night especially on days she works. Will occasionally take during the day if severe pain. Some relief with Flexeril.  Objective:   BP 122/84 mmHg  Ht 5' (1.524 m)  Wt 262 lb 6 oz (119.013 kg)  BMI 51.24 kg/m2 NAD. Alert, oriented. Lungs clear. Heart RRR.   Assessment:  Problem List Items Addressed This Visit      Musculoskeletal and Integument   Fibromyalgia - Primary (Chronic)     Other   Chronic back pain (Chronic)   Relevant Medications      cyclobenzaprine (FLEXERIL) tablet      HYDROcodone-acetaminophen (NORCO/VICODIN) 5-325 MG per tablet       Plan:  Meds ordered this encounter  Medications  . DISCONTD: HYDROcodone-acetaminophen (NORCO/VICODIN) 5-325 MG per tablet    Sig: Take 1 tablet by mouth every 6 (six) hours as needed. Caution drowsiness    Dispense:  48 tablet    Refill:  0    May fill 60 days from 01/24/14    Order Specific Question:  Supervising Provider    Answer:  Merlyn AlbertLUKING, WILLIAM S [[id number]]  . cyclobenzaprine (FLEXERIL) 10 MG tablet    Sig: Take 1 tablet (10 mg total) by mouth 3 (three) times daily as needed for muscle spasms.    Dispense:  90 tablet    Refill:  2    Order Specific Question:  Supervising Provider    Answer:  Merlyn AlbertLUKING, WILLIAM S [[id number]]  . gabapentin (NEURONTIN) 300 MG capsule    Sig: Take 1 capsule (300 mg total) by mouth 2 (two) times daily.    Dispense:  60 capsule    Refill:  2    Order Specific Question:  Supervising Provider    Answer:  Merlyn AlbertLUKING, WILLIAM S [[id number]]  . DISCONTD: HYDROcodone-acetaminophen (NORCO/VICODIN) 5-325 MG per tablet    Sig: Take 1 tablet by mouth every 6 (six) hours as needed. Caution drowsiness    Dispense:  48 tablet    Refill:  0    May fill 30 days from 01/24/14    Order Specific Question:  Supervising Provider    Answer:  Merlyn AlbertLUKING, WILLIAM S [[id number]]  .  HYDROcodone-acetaminophen (NORCO/VICODIN) 5-325 MG per tablet    Sig: Take 1 tablet by mouth every 6 (six) hours as needed. Caution drowsiness    Dispense:  48 tablet    Refill:  0    Order Specific Question:  Supervising Provider    Answer:  Merlyn AlbertLUKING, WILLIAM S [[id number]]   Trial of Gabapentin. DC med and call if any adverse affects.  Return in about 3 months (around 04/25/2014) for med check. Reminded patient about physical; to call week before so labs can be ordered.

## 2014-02-10 ENCOUNTER — Encounter: Payer: Self-pay | Admitting: Nurse Practitioner

## 2014-02-10 ENCOUNTER — Ambulatory Visit (INDEPENDENT_AMBULATORY_CARE_PROVIDER_SITE_OTHER): Payer: BLUE CROSS/BLUE SHIELD | Admitting: Nurse Practitioner

## 2014-02-10 VITALS — BP 130/80 | Ht 60.0 in | Wt 268.4 lb

## 2014-02-10 DIAGNOSIS — B977 Papillomavirus as the cause of diseases classified elsewhere: Secondary | ICD-10-CM

## 2014-02-10 DIAGNOSIS — R896 Abnormal cytological findings in specimens from other organs, systems and tissues: Secondary | ICD-10-CM

## 2014-02-10 DIAGNOSIS — R5382 Chronic fatigue, unspecified: Secondary | ICD-10-CM

## 2014-02-10 DIAGNOSIS — Z124 Encounter for screening for malignant neoplasm of cervix: Secondary | ICD-10-CM

## 2014-02-10 DIAGNOSIS — Z01419 Encounter for gynecological examination (general) (routine) without abnormal findings: Secondary | ICD-10-CM

## 2014-02-10 DIAGNOSIS — Z0000 Encounter for general adult medical examination without abnormal findings: Secondary | ICD-10-CM

## 2014-02-10 DIAGNOSIS — R87629 Unspecified abnormal cytological findings in specimens from vagina: Secondary | ICD-10-CM

## 2014-02-11 LAB — PAP IG AND HPV HIGH-RISK: HPV DNA High Risk: DETECTED — AB

## 2014-02-13 ENCOUNTER — Encounter: Payer: Self-pay | Admitting: Nurse Practitioner

## 2014-02-13 NOTE — Progress Notes (Signed)
   Subjective:    Patient ID: [name] JeffersonAmber L Bray, female    DOB: May 15, 1990, 24 y.o.   MRN: [id number]  HPI presents for wellness exam. No bleeding on Depo Provera. No new partners since last physical. Needs dental exam. Has been looking into bariatric surgery. Active job.     Review of Systems  Constitutional: Negative for fever, activity change, appetite change and fatigue.  HENT: Negative for dental problem, ear pain, sinus pressure and sore throat.   Respiratory: Negative for cough, chest tightness, shortness of breath and wheezing.   Cardiovascular: Negative for chest pain.  Gastrointestinal: Negative for nausea, vomiting, abdominal pain, diarrhea, constipation and abdominal distention.  Genitourinary: Negative for dysuria, urgency, frequency, vaginal bleeding, vaginal discharge, enuresis, difficulty urinating, genital sores and pelvic pain.       Objective:   Physical Exam  Constitutional: She is oriented to person, place, and time. She appears well-developed. No distress.  HENT:  Right Ear: External ear normal.  Left Ear: External ear normal.  Mouth/Throat: Oropharynx is clear and moist.  Neck: Normal range of motion. Neck supple. No tracheal deviation present. No thyromegaly present.  Cardiovascular: Normal rate, regular rhythm and normal heart sounds.  Exam reveals no gallop.   No murmur heard. Pulmonary/Chest: Effort normal and breath sounds normal.  Abdominal: Soft. She exhibits no distension. There is no tenderness.  Genitourinary: Vagina normal and uterus normal. No vaginal discharge found.  External GU: no rashes or lesions. Vagina: no discharge. Cervix normal in appearance; no CMT. Bimanual exam limited due to abdominal girth.  Musculoskeletal: She exhibits no edema.  Lymphadenopathy:    She has no cervical adenopathy.  Neurological: She is alert and oriented to person, place, and time.  Skin: Skin is warm and dry. No rash noted.  Psychiatric: She has a normal mood and  affect. Her behavior is normal.  Vitals reviewed. Breast exam: dense tissue; no masses; axillae no adenopathy.        Assessment & Plan:   Problem List Items Addressed This Visit      Other   Morbid obesity    Other Visit Diagnoses    Well woman exam    -  Primary    Relevant Orders    Pap IG and HPV (high risk) DNA detection (Completed)    Lipid panel    Hepatic function panel    Basic metabolic panel    Screening for cervical cancer        Relevant Orders    Pap IG and HPV (high risk) DNA detection (Completed)    HPV (human papilloma virus) infection        Relevant Orders    Pap IG and HPV (high risk) DNA detection (Completed)    Chronic fatigue        Relevant Orders    TSH    Vit D  25 hydroxy (rtn osteoporosis monitoring)    Insulin, Fasting      Recommend healthy diet, regular activity and weight loss. Consider bariatric surgery.

## 2014-02-15 NOTE — Progress Notes (Signed)
Patient notified and verbalized understanding of the test results. No further questions. Referral placed.

## 2014-02-15 NOTE — Addendum Note (Signed)
Addended byOneal Deputy: Denielle Bayard D on: 02/15/2014 10:42 AM   Modules accepted: Orders

## 2014-03-22 ENCOUNTER — Encounter: Payer: BLUE CROSS/BLUE SHIELD | Admitting: Obstetrics & Gynecology

## 2014-03-31 ENCOUNTER — Ambulatory Visit: Payer: BC Managed Care – PPO

## 2014-04-06 ENCOUNTER — Ambulatory Visit (INDEPENDENT_AMBULATORY_CARE_PROVIDER_SITE_OTHER): Payer: BLUE CROSS/BLUE SHIELD | Admitting: *Deleted

## 2014-04-06 DIAGNOSIS — Z3042 Encounter for surveillance of injectable contraceptive: Secondary | ICD-10-CM

## 2014-04-06 MED ORDER — MEDROXYPROGESTERONE ACETATE 150 MG/ML IM SUSP
150.0000 mg | Freq: Once | INTRAMUSCULAR | Status: AC
  Administered 2014-04-06: 150 mg via INTRAMUSCULAR

## 2014-04-14 ENCOUNTER — Ambulatory Visit: Payer: BLUE CROSS/BLUE SHIELD

## 2014-04-14 ENCOUNTER — Telehealth: Payer: Self-pay | Admitting: Family Medicine

## 2014-04-14 NOTE — Telephone Encounter (Signed)
Pt's Rx insurance is rejecting Proventil HFA, please order ProAir HFA (specifically) and [facility]

## 2014-04-15 ENCOUNTER — Other Ambulatory Visit: Payer: Self-pay | Admitting: Nurse Practitioner

## 2014-04-15 MED ORDER — ALBUTEROL SULFATE HFA 108 (90 BASE) MCG/ACT IN AERS: 2.0000 | INHALATION_SPRAY | RESPIRATORY_TRACT | Status: DC | PRN

## 2014-04-25 ENCOUNTER — Ambulatory Visit (INDEPENDENT_AMBULATORY_CARE_PROVIDER_SITE_OTHER): Payer: BLUE CROSS/BLUE SHIELD | Admitting: Nurse Practitioner

## 2014-04-25 ENCOUNTER — Encounter: Payer: Self-pay | Admitting: Nurse Practitioner

## 2014-04-25 VITALS — BP 132/90 | Ht 60.0 in | Wt 275.0 lb

## 2014-04-25 DIAGNOSIS — R5383 Other fatigue: Secondary | ICD-10-CM | POA: Diagnosis not present

## 2014-04-25 DIAGNOSIS — Z79899 Other long term (current) drug therapy: Secondary | ICD-10-CM

## 2014-04-25 DIAGNOSIS — M797 Fibromyalgia: Secondary | ICD-10-CM

## 2014-04-25 DIAGNOSIS — G8929 Other chronic pain: Secondary | ICD-10-CM

## 2014-04-25 DIAGNOSIS — L83 Acanthosis nigricans: Secondary | ICD-10-CM | POA: Diagnosis not present

## 2014-04-25 DIAGNOSIS — M549 Dorsalgia, unspecified: Secondary | ICD-10-CM | POA: Diagnosis not present

## 2014-04-25 MED ORDER — GABAPENTIN 300 MG PO CAPS: 300.0000 mg | ORAL_CAPSULE | Freq: Two times a day (BID) | ORAL | Status: DC

## 2014-04-25 MED ORDER — CYCLOBENZAPRINE HCL 10 MG PO TABS: 10.0000 mg | ORAL_TABLET | Freq: Three times a day (TID) | ORAL | Status: DC | PRN

## 2014-04-25 MED ORDER — HYDROCODONE-ACETAMINOPHEN 5-325 MG PO TABS: 1.0000 | ORAL_TABLET | Freq: Four times a day (QID) | ORAL | Status: DC | PRN

## 2014-04-25 NOTE — Patient Instructions (Addendum)
As part of your visit today we have covered your chronic pain. You have been given prescription(s) for pain medicines.The [place] and [place] [facility] [place] require that any patient on pain medications must be seen every 3 months. You are expected to come in for a office visit before further pain medications are issued.   We will not refill medications or early nor will we give an extended month supply at the end of these prescriptions.It is your responsibility to keep up with medications. They will not be replaced.  It is your responsibility to schedule an office visit in 3-4 months to be seen before you are out of your medication. Do not call our office to request early refills or additional refills. Do not wait till the last moment to schedule the follow up visit. We highly recommend you schedule this now for 3 months.  We believe that most patients take their meds as prescribed but drug misuse and diversion is a serious problem in the [place]. Our office does standard measures to insure proper care to all. All patients are subject to random urine drug screens and random pill counts. Also all patients drug prescription records are reviewed on a regular basis in accordance with [place] medical board policies.  Remember, do not use alcohol or illegal drugs with your pain medications.    We are required by law to adhere to strict regulations. Failure on our part to follow these regulations could jeopardize our prescription license which in turn would cause us not to be able to care for you.Thank you for your understanding and following these policies.  Low glycemic (GI) diet Low in saturated diet

## 2014-04-29 DIAGNOSIS — L83 Acanthosis nigricans: Secondary | ICD-10-CM | POA: Insufficient documentation

## 2014-04-29 NOTE — Progress Notes (Signed)
Subjective:  Presents for follow up of chronic back pain. Taking flexeril TID, using ice/heat and TENS unit. Along with pain medicine, this allows her to function and perform ADLs. Working full time. No change in pain status. Also has fibromyalgia which has been stable. No changes in diet. Minimal exercise. Has not done labs which have been repeatedly ordered since July of last year.   Objective:   BP 132/90 mmHg  Ht 5' (1.524 m)  Wt 275 lb (124.739 kg)  BMI 53.71 kg/m2 NAD. Alert, oriented. Lungs clear. Heart RRR. Significant central obesity. Acanthosis nigricans noted posterior neck area. Lower extremities no edema.   Assessment:  Problem List Items Addressed This Visit      Musculoskeletal and Integument   Fibromyalgia (Chronic)     Other   Chronic back pain - Primary (Chronic)   Relevant Medications   cyclobenzaprine (FLEXERIL) 10 MG tablet   HYDROcodone-acetaminophen (NORCO/VICODIN) 5-325 MG per tablet   Morbid obesity   Relevant Orders   Lipid panel   Basic metabolic panel   Hemoglobin A1c   Insulin, random    Other Visit Diagnoses    Acanthosis nigricans        Relevant Orders    CBC with Differential/Platelet    Other fatigue        Relevant Orders    TSH    Vit D  25 hydroxy (rtn osteoporosis monitoring)    High risk medication use        Relevant Orders    Hepatic function panel       Plan: Meds ordered this encounter  Medications  . cyclobenzaprine (FLEXERIL) 10 MG tablet    Sig: Take 1 tablet (10 mg total) by mouth 3 (three) times daily as needed for muscle spasms.    Dispense:  90 tablet    Refill:  2    Order Specific Question:  Supervising Provider    Answer:  Merlyn AlbertLUKING, WILLIAM S [[id number]]  . gabapentin (NEURONTIN) 300 MG capsule    Sig: Take 1 capsule (300 mg total) by mouth 2 (two) times daily.    Dispense:  60 capsule    Refill:  2    Order Specific Question:  Supervising Provider    Answer:  Merlyn AlbertLUKING, WILLIAM S [[id number]]  . DISCONTD:  HYDROcodone-acetaminophen (NORCO/VICODIN) 5-325 MG per tablet    Sig: Take 1 tablet by mouth every 6 (six) hours as needed. Caution drowsiness    Dispense:  48 tablet    Refill:  0    Order Specific Question:  Supervising Provider    Answer:  Merlyn AlbertLUKING, WILLIAM S [[id number]]  . DISCONTD: HYDROcodone-acetaminophen (NORCO/VICODIN) 5-325 MG per tablet    Sig: Take 1 tablet by mouth every 6 (six) hours as needed. Caution drowsiness    Dispense:  48 tablet    Refill:  0    May fill 30 days from 04/25/14    Order Specific Question:  Supervising Provider    Answer:  Merlyn AlbertLUKING, WILLIAM S [[id number]]  . HYDROcodone-acetaminophen (NORCO/VICODIN) 5-325 MG per tablet    Sig: Take 1 tablet by mouth every 6 (six) hours as needed. Caution drowsiness    Dispense:  48 tablet    Refill:  0    May fill 60 days from 04/25/14    Order Specific Question:  Supervising Provider    Answer:  Merlyn AlbertLUKING, WILLIAM S [[id number]]   Strongly recommend patient get labs in near future. Discussed importance of regular activity, healthy diet and weight loss especially  for chronic back pain. Also discussed bariatric surgery option.  Return in about 3 months (around 07/25/2014) for recheck.

## 2014-05-09 ENCOUNTER — Other Ambulatory Visit: Payer: Self-pay | Admitting: Obstetrics & Gynecology

## 2014-05-09 ENCOUNTER — Encounter: Payer: Self-pay | Admitting: Obstetrics & Gynecology

## 2014-05-09 ENCOUNTER — Ambulatory Visit (INDEPENDENT_AMBULATORY_CARE_PROVIDER_SITE_OTHER): Payer: BLUE CROSS/BLUE SHIELD | Admitting: Obstetrics & Gynecology

## 2014-05-09 VITALS — BP 118/80 | HR 82 | Wt 274.0 lb

## 2014-05-09 DIAGNOSIS — R87810 Cervical high risk human papillomavirus (HPV) DNA test positive: Principal | ICD-10-CM

## 2014-05-09 DIAGNOSIS — N870 Mild cervical dysplasia: Secondary | ICD-10-CM

## 2014-05-09 DIAGNOSIS — N871 Moderate cervical dysplasia: Secondary | ICD-10-CM

## 2014-05-09 DIAGNOSIS — R87610 Atypical squamous cells of undetermined significance on cytologic smear of cervix (ASC-US): Secondary | ICD-10-CM

## 2014-05-09 NOTE — Progress Notes (Signed)
Patient ID: [name] JeffersonAmber L [name], female   DOB: 1990-01-28, 24 y.o.   MRN: [id number] Colposcopy Procedure Note  Indications: Pap smear 3 months ago showed: ASC cannot exclude high grade lesion [facility] 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  Prior cervical/vaginal disease: no follow up. Prior cervical treatment: no treatment.  Procedure Details  The risks and benefits of the procedure and Written informed consent obtained.  Speculum placed in vagina and excellent visualization of cervix achieved, cervix swabbed x 3 with acetic acid solution.  Findings: Cervix: no abnormal vasculature and visible lesion(s) at 11 o'clock; SCJ visualized 360 degrees without lesions and SCJ visualized - lesion at 11 o'clock. Vaginal inspection: normal without visible lesions. Vulvar colposcopy: vulvar colposcopy not performed.  Specimens: cervicl biopsy  Complications: none.  Plan: Return to discuss Pathology results in 1 week.

## 2014-05-17 ENCOUNTER — Encounter: Payer: Self-pay | Admitting: Obstetrics & Gynecology

## 2014-05-17 ENCOUNTER — Ambulatory Visit (INDEPENDENT_AMBULATORY_CARE_PROVIDER_SITE_OTHER): Payer: BLUE CROSS/BLUE SHIELD | Admitting: Obstetrics & Gynecology

## 2014-05-17 VITALS — BP 110/70 | HR 92 | Wt 270.0 lb

## 2014-05-17 DIAGNOSIS — R87613 High grade squamous intraepithelial lesion on cytologic smear of cervix (HGSIL): Secondary | ICD-10-CM | POA: Diagnosis not present

## 2014-05-17 NOTE — Progress Notes (Signed)
Patient ID: [name] [name], female   DOB: 21-Apr-1990, 24 y.o.   MRN: [id number] Preoperative History and Physical  [name] [name] is a 24 y.o. G1P1001 with No LMP recorded. Patient has had an injection. 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    PMH:    Past Medical History  Diagnosis Date  . Fibromyalgia   . Asthma   . Syringomyelia     thoracic spine  . GERD (gastroesophageal reflux disease)   . Chronic pain     Dr. Murray Hodgkins in [place]  . Chronic back pain   . DDD (degenerative disc disease)   . Scoliosis   . Bulging disc   . Spinal bifida, closed   . Depression   . Renal disorder     kidney stones  . Kidney stone     PSH:     Past Surgical History  Procedure Laterality Date  . Renal lithiasis removed    . Esophagogastroduodenoscopy  01/2010    circumferential distal esophageal erosions, hiatal hernia, excoriations/erosion in gastric body ?trauma from vomiting, bx negative and showed chronic gastritis  . Colonoscopy  01/2010    scattered petechiae and fibrotic-appearing mucosa of uncertain significance , ?resolving infection, bx benign  . Breath hydrogen test  08/08/2010       . Hydrogen breath test  08/06/2010    Procedure: HYDROGEN BREATH TEST;  Surgeon: Corbin Ade, MD;  Location: [facility];  Service: Gastroenterology;  Laterality: N/A;  . Cystoscopy w/ ureteral stent placement  05/28/2011    Procedure: CYSTOSCOPY WITH RETROGRADE PYELOGRAM/URETERAL STENT PLACEMENT;  Surgeon: Ky Barban, MD;  Location: [facility];  Service: Urology;  Laterality: Right;  Cystoscopy with Right Retrograde Pyelogram  . Stone extraction with basket  05/28/2011    Procedure: STONE EXTRACTION WITH BASKET;  Surgeon: Ky Barban, MD;  Location: [facility];  Service: Urology;  Laterality: Right;  . Cystoscopy with urethral dilatation  05/28/2011    Procedure: CYSTOSCOPY WITH URETHRAL DILATATION;  Surgeon: Ky Barban, MD;   Location: [facility];  Service: Urology;  Laterality: N/A;  . Esophagogastroduodenoscopy N/A 02/17/2013    Procedure: ESOPHAGOGASTRODUODENOSCOPY (EGD);  Surgeon: Corbin Ade, MD;  Location: [facility];  Service: Endoscopy;  Laterality: N/A;  11:30-moved to [address] to notify pt  . Cholecystectomy N/A 03/26/2013    Procedure: LAPAROSCOPIC CHOLECYSTECTOMY;  Surgeon: Dalia Heading, MD;  Location: [facility];  Service: General;  Laterality: N/A;    POb/GynH:      OB History    Gravida Para Term Preterm AB TAB SAB Ectopic Multiple Living   SH:   History  Substance Use Topics  . Smoking status: Current Every Day Smoker -- 1.00 packs/day for 10 years    Types: Cigarettes  . Smokeless tobacco: Never Used  . Alcohol Use: 0.0 oz/week     Comment: rare    FH:    Family History  Problem Relation Age of Onset  . Multiple sclerosis Father   . Cervical cancer Mother   . Hypertension Mother   . Anesthesia problems Neg Hx   . Hypotension Neg Hx   . Malignant hyperthermia Neg Hx   . Pseudochol deficiency Neg Hx   . Colon cancer Neg Hx   . Heart disease    . Lung disease    .  Asthma    . Diabetes    . Kidney disease       Allergies:  Allergies  Allergen Reactions  . Sulfamethoxazole Anaphylaxis  . Penicillins Other (See Comments)    Stomach cramps  . Sulfa Antibiotics Other (See Comments)    Childhood Allergy  . Sulfonamide Derivatives Other (See Comments)    Reaction occurred during childhood     Medications:       Current outpatient prescriptions:  .  cyclobenzaprine (FLEXERIL) 10 MG tablet, Take 1 tablet (10 mg total) by mouth 3 (three) times daily as needed for muscle spasms., Disp: 90 tablet, Rfl: 2 .  gabapentin (NEURONTIN) 300 MG capsule, Take 1 capsule (300 mg total) by mouth 2 (two) times daily., Disp: 60 capsule, Rfl: 2 .  HYDROcodone-acetaminophen (NORCO/VICODIN) 5-325 MG per tablet, Take 1 tablet by mouth every 6 (six) hours as needed. Caution  drowsiness, Disp: 48 tablet, Rfl: 0 .  albuterol (PROAIR HFA) 108 (90 BASE) MCG/ACT inhaler, Inhale 2 puffs into the lungs every 4 (four) hours as needed for wheezing or shortness of breath. (Patient not taking: Reported on 05/09/2014), Disp: 1 Inhaler, Rfl: 2  Review of Systems:   Review of Systems  Constitutional: Negative for fever, chills, weight loss, malaise/fatigue and diaphoresis.  HENT: Negative for hearing loss, ear pain, nosebleeds, congestion, sore throat, neck pain, tinnitus and ear discharge.   Eyes: Negative for blurred vision, double vision, photophobia, pain, discharge and redness.  Respiratory: Negative for cough, hemoptysis, sputum production, shortness of breath, wheezing and stridor.   Cardiovascular: Negative for chest pain, palpitations, orthopnea, claudication, leg swelling and PND.  Gastrointestinal: Positive for abdominal pain. Negative for heartburn, nausea, vomiting, diarrhea, constipation, blood in stool and melena.  Genitourinary: Negative for dysuria, urgency, frequency, hematuria and flank pain.  Musculoskeletal: Negative for myalgias, back pain, joint pain and falls.  Skin: Negative for itching and rash.  Neurological: Negative for dizziness, tingling, tremors, sensory change, speech change, focal weakness, seizures, loss of consciousness, weakness and headaches.  Endo/Heme/Allergies: Negative for environmental allergies and polydipsia. Does not bruise/bleed easily.  Psychiatric/Behavioral: Negative for depression, suicidal ideas, hallucinations, memory loss and substance abuse. The patient is not nervous/anxious and does not have insomnia.      PHYSICAL EXAM:  Blood pressure 110/70, pulse 92, weight 270 lb (122.471 kg).    Vitals reviewed. Constitutional: She is oriented to person, place, and time. She appears well-developed and well-nourished.  HENT:  Head: Normocephalic and atraumatic.  Right Ear: External ear normal.  Left Ear: External ear normal.   Nose: Nose normal.  Mouth/Throat: Oropharynx is clear and moist.  Eyes: Conjunctivae and EOM are normal. Pupils are equal, round, and reactive to light. Right eye exhibits no discharge. Left eye exhibits no discharge. No scleral icterus.  Neck: Normal range of motion. Neck supple. No tracheal deviation present. No thyromegaly present.  Cardiovascular: Normal rate, regular rhythm, normal heart sounds and intact distal pulses.  Exam reveals no gallop and no friction rub.   No murmur heard. Respiratory: Effort normal and breath sounds normal. No respiratory distress. She has no wheezes. She has no rales. She exhibits no tenderness.  GI: Soft. Bowel sounds are normal. She exhibits no distension and no mass. There is tenderness. There is no rebound and no guarding.  Genitourinary:       Vulva is normal without lesions Vagina is pink moist without discharge Cervix normal in appearance  Uterus is normal size, contour, position, consistency, mobility, non-tender Adnexa is  negative with normal sized ovaries by sonogram  Musculoskeletal: Normal range of motion. She exhibits no edema and no tenderness.  Neurological: She is alert and oriented to person, place, and time. She has normal reflexes. She displays normal reflexes. No cranial nerve deficit. She exhibits normal muscle tone. Coordination normal.  Skin: Skin is warm and dry. No rash noted. No erythema. No pallor.  Psychiatric: She has a normal mood and affect. Her behavior is normal. Judgment and thought content normal.    Labs: No results found for this or any previous visit (from the past 336 hour(s)).  EKG: No orders found for this or any previous visit.  Imaging Studies: No results found.    Assessment: HSIL  Patient Active Problem List   Diagnosis Date Noted  . Acanthosis nigricans 04/29/2014  . Morbid obesity 02/13/2014  . Fibromyalgia 04/03/2012  . Asthma 04/03/2012  . Kidney stones 04/03/2012  . Right anterior knee pain  08/29/2011  . Periumbilical pain 05/29/2010  . NAUSEA AND VOMITING 01/23/2010  . DIARRHEA 01/23/2010  . RUQ PAIN 01/23/2010  . Syringomyelia and syringobulbia 03/17/2008  . Chronic back pain 03/17/2008    Plan: Laser conization of the cervix 06/01/2014  [name] [name] 05/17/2014 9:50 AM

## 2014-05-26 NOTE — Patient Instructions (Addendum)
Your procedure is scheduled on: 06/01/2014  [facility]   AM.  Call this number if you have problems the morning of surgery: [phone]   Remember:   Do not drink or eat food:After Midnight.  :  Take these medicines the morning of surgery (Use  Albuterol inhaler)   Do not wear jewelry, make-up or nail polish.  Do not wear lotions, powders, or perfumes. You may wear deodorant.  Do not shave 48 hours prior to surgery. Men may shave face and neck.  Do not bring valuables to the hospital.  Contacts, dentures or bridgework may not be worn into surgery.  Leave suitcase in the car. After surgery it may be brought to your room.  For patients admitted to the hospital, checkout time is 11:00 AM the day of discharge.   Patients discharged the day of surgery will not be allowed to drive home.    Special Instructions: Shower using CHG night before surgery and shower the day of surgery use CHG.  Use special wash - you have one bottle of CHG for all showers.  You should use approximately 1/2 of the bottle for each shower.   Please read over the following fact sheets that you were given: Pain Booklet, MRSA Information, Surgical Site Infection Prevention and Care and Recovery After Surgery  Conization of the Cervix, Care After Refer to this sheet in the next few weeks. These instructions provide you with information on caring for yourself after your procedure. Your health care provider may also give you more specific instructions. Your treatment has been planned according to current medical practices but problems sometimes occur. Call your health care provider if you have any problems or questions after your procedure. WHAT TO EXPECT AFTER THE PROCEDURE After your procedure, it is typical to have the following sensations:  If you had a general anesthetic, you may be groggy for 2-3 hours after the procedure.  You may have cramps (similar to menstrual cramps) for about 1 week.   You  may have a bloody discharge or light to moderate bleeding for 1-2 weeks. The bleeding should not be heavy (for example, it should not soak 1 pad in less than 1 hour).  You may have a black vaginal discharge that looks similar to coffee grounds. This is from the paste that was applied to the cervix to control bleeding. This is normal. Recovery may take up to 3 weeks.  HOME CARE INSTRUCTIONS   Arrange for someone to drive you home after the procedure.  Only take medicines as directed by your health care provider. Do not take aspirin. It can cause bleeding.   Take showers for the first week. Do not take baths, swim, or use hot tubs until your health care provider says it is okay.   Do not douche, use tampons, or have sexual intercourse until your health care provider says it is okay.   Avoid strenuous activities, exercises, and heavy lifting for at least 7-14 days.  You may resume your normal diet unless your health care provider advises you differently.    If you are constipated, you may:  Take a mild laxative as directed by your health care provider.   Add fruit and bran to your diet.   Make sure to drink enough fluids to keep your urine clear or pale yellow.  Keep follow-up appointments with your health care provider. SEEK MEDICAL CARE IF:   You develop a rash.   You are  dizzy or lightheaded.   You feel nauseous.   You develop a bad smelling vaginal discharge. SEEK IMMEDIATE MEDICAL CARE IF:   You have blood clots or bleeding that is heavier than a normal menstrual period (for example, soaking a pad in less than 1 hour) or you develop bright red bleeding.   You have a fever over 101F (38.3C) or persistent symptoms for more than 2-3 days.   You have a fever over 101F (38.3C) and your symptoms suddenly get worse.  You have increasing cramps.   You faint.   You have pain when urinating.  You have bloody urine.   You start vomiting.   Your pain  is not relieved with your medicine.  General Anesthesia, Care After Refer to this sheet in the next few weeks. These instructions provide you with information on caring for yourself after your procedure. Your health care provider may also give you more specific instructions. Your treatment has been planned according to current medical practices, but problems sometimes occur. Call your health care provider if you have any problems or questions after your procedure. WHAT TO EXPECT AFTER THE PROCEDURE After the procedure, it is typical to experience: Sleepiness. Nausea and vomiting. HOME CARE INSTRUCTIONS For the first 24 hours after general anesthesia: Have a responsible person with you. Do not drive a car. If you are alone, do not take public transportation. Do not drink alcohol. Do not take medicine that has not been prescribed by your health care provider. Do not sign important papers or make important decisions. You may resume a normal diet and activities as directed by your health care provider. Change bandages (dressings) as directed. If you have questions or problems that seem related to general anesthesia, call the hospital and ask for the anesthetist or anesthesiologist on call. SEEK MEDICAL CARE IF: You have nausea and vomiting that continue the day after anesthesia. You develop a rash. SEEK IMMEDIATE MEDICAL CARE IF:  You have difficulty breathing. You have chest pain. You have any allergic problems. Document Released: 04/08/2000 Document Revised: 01/05/2013 Document Reviewed: 07/16/2012 [facility] Patient Information 2015 [employer], [place]. This information is not intended to replace advice given to you by your health care provider. Make sure you discuss any questions you have with your health care provider.   Your have severe or worsening pain. MAKE SURE YOU:  Understand these instructions.  Will watch your condition.  Will get help right away if you are not doing well or  get worse. Document Released: 12/31/2004 Document Revised: 01/05/2013 Document Reviewed: 06/26/2012 [facility] Patient Information 2015 [place], [place]. This information is not intended to replace advice given to you by your health care provider. Make sure you discuss any questions you have with your health care provider.

## 2014-05-27 ENCOUNTER — Encounter (HOSPITAL_COMMUNITY): Payer: Self-pay

## 2014-05-27 ENCOUNTER — Telehealth: Payer: Self-pay | Admitting: Family Medicine

## 2014-05-27 ENCOUNTER — Other Ambulatory Visit: Payer: Self-pay | Admitting: *Deleted

## 2014-05-27 ENCOUNTER — Other Ambulatory Visit: Payer: Self-pay | Admitting: Obstetrics & Gynecology

## 2014-05-27 ENCOUNTER — Encounter (HOSPITAL_COMMUNITY)
Admission: RE | Admit: 2014-05-27 | Discharge: 2014-05-27 | Disposition: A | Discharge status: Home / Self Care | Payer: BLUE CROSS/BLUE SHIELD | Source: Ambulatory Visit | Attending: Obstetrics & Gynecology | Admitting: Obstetrics & Gynecology

## 2014-05-27 DIAGNOSIS — R87613 High grade squamous intraepithelial lesion on cytologic smear of cervix (HGSIL): Secondary | ICD-10-CM | POA: Insufficient documentation

## 2014-05-27 DIAGNOSIS — Z01818 Encounter for other preprocedural examination: Secondary | ICD-10-CM | POA: Insufficient documentation

## 2014-05-27 HISTORY — DX: Adverse effect of unspecified anesthetic, initial encounter: T41.45XA

## 2014-05-27 HISTORY — DX: Other complications of anesthesia, initial encounter: T88.59XA

## 2014-05-27 LAB — CBC
HEMATOCRIT: 42.9 % (ref 36.0–46.0)
Hemoglobin: 14.7 g/dL (ref 12.0–15.0)
MCH: 29.6 pg (ref 26.0–34.0)
MCHC: 34.3 g/dL (ref 30.0–36.0)
MCV: 86.3 fL (ref 78.0–100.0)
PLATELETS: 242 10*3/uL (ref 150–400)
RBC: 4.97 MIL/uL (ref 3.87–5.11)
RDW: 12.6 % (ref 11.5–15.5)
WBC: 8.2 10*3/uL (ref 4.0–10.5)

## 2014-05-27 LAB — HCG, QUANTITATIVE, PREGNANCY: hCG, Beta Chain, Quant, S: <1 m[IU]/mL (ref <5)

## 2014-05-27 LAB — URINALYSIS, ROUTINE W REFLEX MICROSCOPIC
Bilirubin Urine: NEGATIVE
Glucose, UA: NEGATIVE mg/dL
Ketones, ur: NEGATIVE mg/dL
Nitrite: NEGATIVE
Protein, ur: NEGATIVE mg/dL
Specific Gravity, Urine: >1.03 — ABNORMAL HIGH (ref 1.005–1.030)
Urobilinogen, UA: 0.2 mg/dL (ref 0.0–1.0)
pH: 5 (ref 5.0–8.0)

## 2014-05-27 LAB — COMPREHENSIVE METABOLIC PANEL
ALBUMIN: 3.7 g/dL (ref 3.5–5.0)
ALK PHOS: 90 U/L (ref 38–126)
ALT: 25 U/L (ref 14–54)
AST: 22 U/L (ref 15–41)
Anion gap: 6 (ref 5–15)
BUN: 11 mg/dL (ref 6–20)
CALCIUM: 8.8 mg/dL — AB (ref 8.9–10.3)
CO2: 26 mmol/L (ref 22–32)
CREATININE: 0.8 mg/dL (ref 0.44–1.00)
Chloride: 105 mmol/L (ref 101–111)
Glucose, Bld: 118 mg/dL — ABNORMAL HIGH (ref 65–99)
POTASSIUM: 4.4 mmol/L (ref 3.5–5.1)
Sodium: 137 mmol/L (ref 135–145)
Total Bilirubin: 0.4 mg/dL (ref 0.3–1.2)
Total Protein: 6.3 g/dL — ABNORMAL LOW (ref 6.5–8.1)

## 2014-05-27 LAB — URINE MICROSCOPIC-ADD ON

## 2014-05-27 MED ORDER — ALBUTEROL SULFATE HFA 108 (90 BASE) MCG/ACT IN AERS: 2.0000 | INHALATION_SPRAY | RESPIRATORY_TRACT | Status: DC | PRN

## 2014-05-27 NOTE — Telephone Encounter (Signed)
Pt insurance did cover proair. It was $50. Called pharm and this is the cheapest inhaler.

## 2014-05-27 NOTE — Telephone Encounter (Signed)
Called pharm and proair is the cheapest inhaler. Pt notified. Pt advised to try and get coupon from the website

## 2014-05-27 NOTE — Telephone Encounter (Signed)
Patient says that she cannot afford the albuterol (PROAIR HFA) 108 (90 BASE) MCG/ACT inhaler because it is $50. She wants to know if there is a cheaper alternative we can call in?    [facility]

## 2014-05-27 NOTE — Telephone Encounter (Signed)
albuterol (PROAIR HFA) 108 (90 BASE) MCG/ACT inhaler  Pt states that when you issued this for her several weeks ago They wouldn't cover it with her insurance. States she needs  Something else they will cover. States she needs it before Wednesday When she is scheduled surgery and they have asked her to use  It [facility]

## 2014-05-27 NOTE — Telephone Encounter (Signed)
[facility] did not receive prior auth for med. [facility] and they state they have never filled this med for her. [facility] if not they will send prior auth letter. Pt notified. We will call back on Monday if med needs to be changed.

## 2014-06-01 ENCOUNTER — Ambulatory Visit (HOSPITAL_COMMUNITY): Payer: BLUE CROSS/BLUE SHIELD | Admitting: Anesthesiology

## 2014-06-01 ENCOUNTER — Ambulatory Visit (HOSPITAL_COMMUNITY)
Admission: RE | Admit: 2014-06-01 | Discharge: 2014-06-01 | Disposition: A | Discharge status: Home / Self Care | Payer: BLUE CROSS/BLUE SHIELD | Source: Ambulatory Visit | Attending: Obstetrics & Gynecology | Admitting: Obstetrics & Gynecology

## 2014-06-01 ENCOUNTER — Encounter (HOSPITAL_COMMUNITY): Payer: Self-pay

## 2014-06-01 ENCOUNTER — Encounter (HOSPITAL_COMMUNITY)
Admission: RE | Disposition: A | Discharge status: Home / Self Care | Payer: Self-pay | Source: Ambulatory Visit | Attending: Obstetrics & Gynecology

## 2014-06-01 DIAGNOSIS — Z9049 Acquired absence of other specified parts of digestive tract: Secondary | ICD-10-CM | POA: Insufficient documentation

## 2014-06-01 DIAGNOSIS — Z87442 Personal history of urinary calculi: Secondary | ICD-10-CM | POA: Diagnosis not present

## 2014-06-01 DIAGNOSIS — Z880 Allergy status to penicillin: Secondary | ICD-10-CM | POA: Diagnosis not present

## 2014-06-01 DIAGNOSIS — Z882 Allergy status to sulfonamides status: Secondary | ICD-10-CM | POA: Diagnosis not present

## 2014-06-01 DIAGNOSIS — J45909 Unspecified asthma, uncomplicated: Secondary | ICD-10-CM | POA: Insufficient documentation

## 2014-06-01 DIAGNOSIS — F17210 Nicotine dependence, cigarettes, uncomplicated: Secondary | ICD-10-CM | POA: Diagnosis not present

## 2014-06-01 DIAGNOSIS — Z881 Allergy status to other antibiotic agents status: Secondary | ICD-10-CM | POA: Insufficient documentation

## 2014-06-01 DIAGNOSIS — Z6841 Body Mass Index (BMI) 40.0 and over, adult: Secondary | ICD-10-CM | POA: Diagnosis not present

## 2014-06-01 DIAGNOSIS — F329 Major depressive disorder, single episode, unspecified: Secondary | ICD-10-CM | POA: Diagnosis not present

## 2014-06-01 DIAGNOSIS — R87613 High grade squamous intraepithelial lesion on cytologic smear of cervix (HGSIL): Secondary | ICD-10-CM | POA: Diagnosis not present

## 2014-06-01 DIAGNOSIS — K219 Gastro-esophageal reflux disease without esophagitis: Secondary | ICD-10-CM | POA: Insufficient documentation

## 2014-06-01 DIAGNOSIS — N871 Moderate cervical dysplasia: Secondary | ICD-10-CM | POA: Insufficient documentation

## 2014-06-01 HISTORY — DX: Other symptoms and signs involving the musculoskeletal system: R29.898

## 2014-06-01 HISTORY — PX: CERVICAL CONIZATION W/BX: SHX1330

## 2014-06-01 HISTORY — PX: HOLMIUM LASER APPLICATION: SHX5852

## 2014-06-01 SURGERY — CONE BIOPSY, CERVIX
Anesthesia: General

## 2014-06-01 MED ORDER — KETOROLAC TROMETHAMINE 30 MG/ML IJ SOLN
30.0000 mg | Freq: Once | INTRAMUSCULAR | Status: AC
  Administered 2014-06-01: 30 mg via INTRAVENOUS
  Filled 2014-06-01: qty 1

## 2014-06-01 MED ORDER — SUCCINYLCHOLINE CHLORIDE 20 MG/ML IJ SOLN
INTRAMUSCULAR | Status: AC
  Filled 2014-06-01: qty 1

## 2014-06-01 MED ORDER — LIDOCAINE HCL (CARDIAC) 20 MG/ML IV SOLN
INTRAVENOUS | Status: DC | PRN
  Administered 2014-06-01: 40 mg via INTRAVENOUS

## 2014-06-01 MED ORDER — FENTANYL CITRATE (PF) 250 MCG/5ML IJ SOLN
INTRAMUSCULAR | Status: DC | PRN
  Administered 2014-06-01: 50 ug via INTRAVENOUS
  Administered 2014-06-01 (×3): 25 ug via INTRAVENOUS

## 2014-06-01 MED ORDER — CIPROFLOXACIN IN D5W 400 MG/200ML IV SOLN
400.0000 mg | INTRAVENOUS | Status: AC
  Administered 2014-06-01: 400 mg via INTRAVENOUS
  Filled 2014-06-01: qty 200

## 2014-06-01 MED ORDER — MIDAZOLAM HCL 2 MG/2ML IJ SOLN
1.0000 mg | INTRAMUSCULAR | Status: DC | PRN
Start: 2014-06-01 — End: 2014-06-01
  Administered 2014-06-01: 2 mg via INTRAVENOUS

## 2014-06-01 MED ORDER — ONDANSETRON HCL 8 MG PO TABS: 8.0000 mg | ORAL_TABLET | Freq: Three times a day (TID) | ORAL | Status: DC | PRN

## 2014-06-01 MED ORDER — ROCURONIUM BROMIDE 50 MG/5ML IV SOLN
INTRAVENOUS | Status: AC
  Filled 2014-06-01: qty 1

## 2014-06-01 MED ORDER — FENTANYL CITRATE (PF) 100 MCG/2ML IJ SOLN
25.0000 ug | INTRAMUSCULAR | Status: AC
  Administered 2014-06-01 (×2): 25 ug via INTRAVENOUS

## 2014-06-01 MED ORDER — GLYCOPYRROLATE 0.2 MG/ML IJ SOLN
0.2000 mg | Freq: Once | INTRAMUSCULAR | Status: AC
  Administered 2014-06-01: 0.2 mg via INTRAVENOUS

## 2014-06-01 MED ORDER — GLYCOPYRROLATE 0.2 MG/ML IJ SOLN
INTRAMUSCULAR | Status: AC
  Filled 2014-06-01: qty 1

## 2014-06-01 MED ORDER — WATER FOR IRRIGATION, STERILE IR SOLN
Status: DC | PRN
  Administered 2014-06-01: 1000 mL

## 2014-06-01 MED ORDER — FERRIC SUBSULFATE SOLN
Status: DC | PRN
  Administered 2014-06-01: 1 via TOPICAL

## 2014-06-01 MED ORDER — KETOROLAC TROMETHAMINE 10 MG PO TABS: 10.0000 mg | ORAL_TABLET | Freq: Three times a day (TID) | ORAL | Status: DC | PRN

## 2014-06-01 MED ORDER — PROPOFOL 10 MG/ML IV BOLUS
INTRAVENOUS | Status: DC | PRN
  Administered 2014-06-01 (×2): 50 mg via INTRAVENOUS
  Administered 2014-06-01: 200 mg via INTRAVENOUS

## 2014-06-01 MED ORDER — LIDOCAINE HCL (PF) 1 % IJ SOLN
INTRAMUSCULAR | Status: AC
  Filled 2014-06-01: qty 5

## 2014-06-01 MED ORDER — CLINDAMYCIN PHOSPHATE 900 MG/50ML IV SOLN
900.0000 mg | INTRAVENOUS | Status: AC
  Administered 2014-06-01: 900 mg via INTRAVENOUS
  Filled 2014-06-01: qty 50

## 2014-06-01 MED ORDER — FENTANYL CITRATE (PF) 250 MCG/5ML IJ SOLN
INTRAMUSCULAR | Status: AC
  Filled 2014-06-01: qty 5

## 2014-06-01 MED ORDER — ONDANSETRON HCL 4 MG/2ML IJ SOLN
4.0000 mg | Freq: Once | INTRAMUSCULAR | Status: AC
Start: 2014-06-01 — End: 2014-06-01
  Administered 2014-06-01: 4 mg via INTRAVENOUS

## 2014-06-01 MED ORDER — LACTATED RINGERS IV SOLN
INTRAVENOUS | Status: DC
  Administered 2014-06-01: 08:00:00 via INTRAVENOUS

## 2014-06-01 MED ORDER — PROPOFOL 10 MG/ML IV BOLUS
INTRAVENOUS | Status: AC
  Filled 2014-06-01: qty 20

## 2014-06-01 MED ORDER — FENTANYL CITRATE (PF) 100 MCG/2ML IJ SOLN
25.0000 ug | INTRAMUSCULAR | Status: DC | PRN
  Administered 2014-06-01 (×3): 50 ug via INTRAVENOUS
  Filled 2014-06-01 (×2): qty 2

## 2014-06-01 MED ORDER — ONDANSETRON HCL 4 MG/2ML IJ SOLN: 4.0000 mg | Freq: Once | INTRAMUSCULAR | Status: DC | PRN

## 2014-06-01 MED ORDER — FENTANYL CITRATE (PF) 100 MCG/2ML IJ SOLN
INTRAMUSCULAR | Status: AC
  Filled 2014-06-01: qty 2

## 2014-06-01 MED ORDER — HYDROCODONE-ACETAMINOPHEN 5-325 MG PO TABS: 1.0000 | ORAL_TABLET | Freq: Four times a day (QID) | ORAL | Status: DC | PRN

## 2014-06-01 MED ORDER — ONDANSETRON HCL 4 MG/2ML IJ SOLN
INTRAMUSCULAR | Status: AC
  Filled 2014-06-01: qty 2

## 2014-06-01 MED ORDER — MIDAZOLAM HCL 2 MG/2ML IJ SOLN
INTRAMUSCULAR | Status: AC
  Filled 2014-06-01: qty 2

## 2014-06-01 MED ORDER — ACETIC ACID 5 % SOLN
Status: DC | PRN
Start: 2014-06-01 — End: 2014-06-01
  Administered 2014-06-01: 1 via TOPICAL

## 2014-06-01 SURGICAL SUPPLY — 29 items
APPLICATOR COTTON TIP 6IN STRL (MISCELLANEOUS) ×5 IMPLANT
CLOTH BEACON ORANGE TIMEOUT ST (SAFETY) ×3 IMPLANT
COAGULATOR SUCT 6 FR SWTCH (ELECTROSURGICAL) ×1
COAGULATOR SUCT SWTCH 10FR 6 (ELECTROSURGICAL) ×2 IMPLANT
COVER LIGHT HANDLE STERIS (MISCELLANEOUS) ×6 IMPLANT
ELECT REM PT RETURN 9FT ADLT (ELECTROSURGICAL) ×3
ELECTRODE REM PT RTRN 9FT ADLT (ELECTROSURGICAL) ×1 IMPLANT
FORMALIN 10 PREFIL 120ML (MISCELLANEOUS) ×5 IMPLANT
FORMALIN 10 PREFIL 480ML (MISCELLANEOUS) ×4 IMPLANT
GAUZE SPONGE 4X4 16PLY XRAY LF (GAUZE/BANDAGES/DRESSINGS) ×3 IMPLANT
GLOVE BIOGEL PI IND STRL 7.0 (GLOVE) IMPLANT
GLOVE BIOGEL PI IND STRL 8 (GLOVE) ×1 IMPLANT
GLOVE BIOGEL PI INDICATOR 7.0 (GLOVE) ×2
GLOVE BIOGEL PI INDICATOR 8 (GLOVE) ×2
GLOVE ECLIPSE 6.5 STRL STRAW (GLOVE) ×2 IMPLANT
GLOVE ECLIPSE 8.0 STRL XLNG CF (GLOVE) ×3 IMPLANT
GOWN STRL REUS W/TWL LRG LVL3 (GOWN DISPOSABLE) ×3 IMPLANT
GOWN STRL REUS W/TWL XL LVL3 (GOWN DISPOSABLE) ×3 IMPLANT
KIT ROOM TURNOVER APOR (KITS) ×3 IMPLANT
LASER FIBER DISP 1000U (UROLOGICAL SUPPLIES) ×3 IMPLANT
MANIFOLD NEPTUNE II (INSTRUMENTS) ×3 IMPLANT
NS IRRIG 1000ML POUR BTL (IV SOLUTION) ×3 IMPLANT
PACK BASIC III (CUSTOM PROCEDURE TRAY) ×3
PACK SRG BSC III STRL LF ECLPS (CUSTOM PROCEDURE TRAY) ×1 IMPLANT
PAD ARMBOARD 7.5X6 YLW CONV (MISCELLANEOUS) ×3 IMPLANT
PREFILTER SMOKE EVAC (FILTER) ×3 IMPLANT
TOWEL OR 17X26 4PK STRL BLUE (TOWEL DISPOSABLE) ×3 IMPLANT
TUBING SMOKE EVAC CO2 (TUBING) ×3 IMPLANT
WATER STERILE IRR 1000ML POUR (IV SOLUTION) ×3 IMPLANT

## 2014-06-01 NOTE — Op Note (Signed)
Preoperative diagnosis:  1.  High Grade Squamous Intraepithelial lesion  Postoperative Diagnosis:  Same as above  Procedure:  Cervical conization using laser,  ablation of cervical bed using laser  Surgeon:  Rockne CoonsLuther H Kendyll Huettner Jr MD  Anesthesia:  Laryngeal mask airway  Findings:  Patient had never had an abnormal Pap smear before.  However her last Pap returned as atypical squamous cells cannot rule out high-grade dysplasia lesion.  I performed a colposcopy in the office and found a cervical lesion.  A biopsy returned revealing high-grade dysplasia.  As a result we decided to proceed with a conization of the cervix for both therapeutic and diagnostic reasons.    Today at the time of surgery a repeat colposcopy was performed using 3% acetic acid and the lesion was once again confirmed.  There  were no new findings today.  Description of operation:  Patient was taken to the operating room and placed in the supine position where she underwent laryngeal mask airway anesthesia.  She was then placed in the high lithotomy position using candy cane stirrups.  She was then draped out for a laser procedure.  The microscope was used and 3% acetic acid was placed on the cervix.  The holmium laser was then employed at a power of 2.5 and rates between 10 and 15.  I achieved a couple millimeter margin around lesions both at 12:00 and 6:00 the laser was used to perform a conization.  The specimen was removed and sent to pathology for evaluation.  As is always the case with laser I did achieve at an appropriate margin around the disease with shrinkage of the tissue during the procedure it may appear to be a positive lateral margin.  However the surgical margin is indeed clear.  I then used the laser to ablate the conization bed to a depth of 5-7 mm laterally coning down to 9 mm centrally and began getting good surgical margin.  Additional hemostasis was achieved using Monsel solution.  In the conization bed was completely  hemostatic.  Blood loss for the procedure was none.  The patient received 400 mg of ciprofloxacin and 900 mg of Cleocin preoperatively.  The patient was awakened from anesthesia and taken to the recovery room in good stable condition with all counts being correct.  She will be followed up in the office in one week for evaluation of the conization bed.  Lazaro ArmsEURE,Sasha Rueth H, MD 06/01/2014 9:12 AM  Please Note:  Due to the patient's obesity and the depth of the vagina I had to use a traditional speculum, not a coated speculum.  I was unable to visualize the cervix and "flip" it up with the longest coated speculum. The patient was already under LMA general anesthesia and she underwent an enormous amount of difficulty in getting her IV started.  Of note, I have previously asked to have a longer laser approved speculum available as many of our patients were at the edge of our available speculum lengths.  Due to my knowledge of this being a contact/nearcontact laser and my extensive use of the Holmium YAG laser I felt it safe to continue with a non coated speculum. I allowed the nursing staff to come to their conclusion regarding this issue as it relates to patient safety.   Their final statement made regarding this issue was "it is discouraged and not recommended to be used with a traditional speculum."   I have a long experience with CO2, Holmium:YAG and topical lasers throughout my  nearly 326 year career and have never experienced any injuries or adverse outcomes through the direct use of the laser. My previous use includes intraperitoneal ablation of endometriosis and other lesions and hysteroscopic resection of submucosal fibroids and removal of large polyps.  My position is that a Ho:YAG laser is a near contact laser, not an infinite beam type laser, CO2, for instance, which is really the lasers that these standards were initiated for. As such any laser injury which would occur would be a result of me firing the  laser at an inappropriate time and placed in a near contact/contact position. There is a long history of safe use of metal urological guidewires and snares which are not laser safety coated.  The attached reference speaks to that contact 1-5 mm safety of a Ho:YAG laser.  There are other articles which also speak to this safety.   https://owens.org/Http://www.ncbi.nlm.nih.gov/pubmed/9355949  I decided to continue with the case given all of the factors involved.  In contrast, had this been a CO2 laser or another so called infinite beam type laser, I would not have continued.  The patient did not suffer any unintended laser and/or thermal energy injuries during the case.  All went as planned.

## 2014-06-01 NOTE — Transfer of Care (Signed)
Immediate Anesthesia Transfer of Care Note  Patient: [name] [name]  Procedure(s) Performed: Procedure(s): LASER CONIZATION OF CERVIX (N/A) HOLMIUM LASER APPLICATION (N/A)  Patient Location: PACU  Anesthesia Type:General  Level of Consciousness: awake  Airway & Oxygen Therapy: Patient Spontanous Breathing and Patient connected to face mask oxygen  Post-op Assessment: Report given to RN  Post vital signs: Reviewed and stable  Last Vitals:  Filed Vitals:   06/01/14 0710  BP: 128/85  Pulse:   Temp:   Resp: 21    Complications: No apparent anesthesia complications

## 2014-06-01 NOTE — H&P (Signed)
Preoperative History and Physical  [name] [name] [name] [name] is a 24 y.o. G1P1001 with No LMP recorded. Patient has had an injection. admitted for a laser conization of the cervix due to high grade cervical dysplasia on colposcopically directed biopsy.  Adequate colposcopy large lesion  PMH:    Past Medical History  Diagnosis Date  . Fibromyalgia   . Asthma   . Syringomyelia     thoracic spine  . GERD (gastroesophageal reflux disease)   . Chronic pain     Dr. Murray Hodgkins in [place]  . Chronic back pain   . DDD (degenerative disc disease)   . Scoliosis   . Bulging disc   . Spinal bifida, closed   . Depression   . Renal disorder     kidney stones  . Kidney stone   . Complication of anesthesia     larynaspasm after extubation following Cholecystectomy  . Popping of temporomandibular joint on opening of jaw     PSH:     Past Surgical History  Procedure Laterality Date  . Renal lithiasis removed    . Esophagogastroduodenoscopy  01/2010    circumferential distal esophageal erosions, hiatal hernia, excoriations/erosion in gastric body ?trauma from vomiting, bx negative and showed chronic gastritis  . Colonoscopy  01/2010    scattered petechiae and fibrotic-appearing mucosa of uncertain significance , ?resolving infection, bx benign  . Breath hydrogen test  08/08/2010       . Hydrogen breath test  08/06/2010    Procedure: HYDROGEN BREATH TEST;  Surgeon: Corbin Ade, MD;  Location: [facility];  Service: Gastroenterology;  Laterality: N/A;  . Cystoscopy w/ ureteral stent placement  05/28/2011    Procedure: CYSTOSCOPY WITH RETROGRADE PYELOGRAM/URETERAL STENT PLACEMENT;  Surgeon: Ky Barban, MD;  Location: [facility];  Service: Urology;  Laterality: Right;  Cystoscopy with Right Retrograde Pyelogram  . Stone extraction with basket  05/28/2011    Procedure: STONE EXTRACTION WITH BASKET;  Surgeon: Ky Barban, MD;  Location: [facility];  Service: Urology;  Laterality: Right;  . Cystoscopy with urethral  dilatation  05/28/2011    Procedure: CYSTOSCOPY WITH URETHRAL DILATATION;  Surgeon: Ky Barban, MD;  Location: [facility];  Service: Urology;  Laterality: N/A;  . Esophagogastroduodenoscopy N/A 02/17/2013    Procedure: ESOPHAGOGASTRODUODENOSCOPY (EGD);  Surgeon: Corbin Ade, MD;  Location: [facility];  Service: Endoscopy;  Laterality: N/A;  11:30-moved to [address] to notify pt  . Cholecystectomy N/A 03/26/2013    Procedure: LAPAROSCOPIC CHOLECYSTECTOMY;  Surgeon: Dalia Heading, MD;  Location: [facility];  Service: General;  Laterality: N/A;    POb/GynH:      OB History    Gravida Para Term Preterm AB TAB SAB Ectopic Multiple Living   SH:   History  Substance Use Topics  . Smoking status: Current Every Day Smoker -- 1.00 packs/day for 10 years    Types: Cigarettes  . Smokeless tobacco: Never Used  . Alcohol Use: 0.0 oz/week     Comment: rare    FH:    Family History  Problem Relation Age of Onset  . Multiple sclerosis Father   . Cervical cancer Mother   . Hypertension Mother   . Anesthesia problems Neg Hx   . Hypotension Neg Hx   . Malignant hyperthermia Neg Hx   . Pseudochol deficiency Neg Hx   . Colon cancer Neg Hx   .  Heart disease    . Lung disease    . Asthma    . Diabetes    . Kidney disease       Allergies:  Allergies  Allergen Reactions  . Sulfamethoxazole Anaphylaxis  . Penicillins Other (See Comments)    Stomach cramps  . Sulfa Antibiotics Other (See Comments)    Childhood Allergy  . Sulfonamide Derivatives Other (See Comments)    Reaction occurred during childhood     Medications:       Current facility-administered medications:  .  clindamycin (CLEOCIN) IVPB 900 mg, 900 mg, Intravenous, On Call to OR **AND** ciprofloxacin (CIPRO) IVPB 400 mg, 400 mg, Intravenous, On Call to OR, Lazaro ArmsLuther H Berwyn Bigley, MD .  glycopyrrolate (ROBINUL) injection 0.2 mg, 0.2 mg, Intravenous, Once, Laurene FootmanLuis Gonzalez, MD .  ketorolac (TORADOL) 30 MG/ML  injection 30 mg, 30 mg, Intravenous, Once, Lazaro ArmsLuther H Puja Caffey, MD .  lactated ringers infusion, , Intravenous, Continuous, Laurene FootmanLuis Gonzalez, MD .  midazolam (VERSED) injection 1-2 mg, 1-2 mg, Intravenous, Q5 min PRN, Laurene FootmanLuis Gonzalez, MD .  ondansetron [facility](ZOFRAN) injection 4 mg, 4 mg, Intravenous, Once, Laurene FootmanLuis Gonzalez, MD  Review of Systems:   Review of Systems  Constitutional: Negative for fever, chills, weight loss, malaise/fatigue and diaphoresis.  HENT: Negative for hearing loss, ear pain, nosebleeds, congestion, sore throat, neck pain, tinnitus and ear discharge.   Eyes: Negative for blurred vision, double vision, photophobia, pain, discharge and redness.  Respiratory: Negative for cough, hemoptysis, sputum production, shortness of breath, wheezing and stridor.   Cardiovascular: Negative for chest pain, palpitations, orthopnea, claudication, leg swelling and PND.  Gastrointestinal: Positive for abdominal pain. Negative for heartburn, nausea, vomiting, diarrhea, constipation, blood in stool and melena.  Genitourinary: Negative for dysuria, urgency, frequency, hematuria and flank pain.  Musculoskeletal: Negative for myalgias, back pain, joint pain and falls.  Skin: Negative for itching and rash.  Neurological: Negative for dizziness, tingling, tremors, sensory change, speech change, focal weakness, seizures, loss of consciousness, weakness and headaches.  Endo/Heme/Allergies: Negative for environmental allergies and polydipsia. Does not bruise/bleed easily.  Psychiatric/Behavioral: Negative for depression, suicidal ideas, hallucinations, memory loss and substance abuse. The patient is not nervous/anxious and does not have insomnia.      PHYSICAL EXAM:  Blood pressure 128/85, pulse 95, temperature 97.8 F (36.6 C), temperature source Oral, resp. rate 21, height 5' 0.5" (1.537 m), weight 273 lb (123.832 kg), SpO2 97 %.    Vitals reviewed. Constitutional: She is oriented to person, place, and time.  She appears well-developed and well-nourished.  HENT:  Head: Normocephalic and atraumatic.  Right Ear: External ear normal.  Left Ear: External ear normal.  Nose: Nose normal.  Mouth/Throat: Oropharynx is clear and moist.  Eyes: Conjunctivae and EOM are normal. Pupils are equal, round, and reactive to light. Right eye exhibits no discharge. Left eye exhibits no discharge. No scleral icterus.  Neck: Normal range of motion. Neck supple. No tracheal deviation present. No thyromegaly present.  Cardiovascular: Normal rate, regular rhythm, normal heart sounds and intact distal pulses.  Exam reveals no gallop and no friction rub.   No murmur heard. Respiratory: Effort normal and breath sounds normal. No respiratory distress. She has no wheezes. She has no rales. She exhibits no tenderness.  GI: Soft. Bowel sounds are normal. She exhibits no distension and no mass. There is tenderness. There is no rebound and no guarding.  Genitourinary:       Vulva is normal without lesions Vagina is pink moist without discharge  Cervix normal in appearance se colposcopy note Uterus is normal size, contour, position, consistency, mobility, non-tender Adnexa is negative with normal sized ovaries by sonogram  Musculoskeletal: Normal range of motion. She exhibits no edema and no tenderness.  Neurological: She is alert and oriented to person, place, and time. She has normal reflexes. She displays normal reflexes. No cranial nerve deficit. She exhibits normal muscle tone. Coordination normal.  Skin: Skin is warm and dry. No rash noted. No erythema. No pallor.  Psychiatric: She has a normal mood and affect. Her behavior is normal. Judgment and thought content normal.    Labs: Results for orders placed or performed during the hospital encounter of 05/27/14 (from the past 336 hour(s))  CBC   Collection Time: 05/27/14  9:05 AM  Result Value Ref Range   WBC 8.2 4.0 - 10.5 K/uL   RBC 4.97 3.87 - 5.11 MIL/uL   Hemoglobin  14.7 12.0 - 15.0 g/dL   HCT 86.542.9 78.436.0 - 69.646.0 %   MCV 86.3 78.0 - 100.0 fL   MCH 29.6 26.0 - 34.0 pg   MCHC 34.3 30.0 - 36.0 g/dL   RDW 29.512.6 28.411.5 - 13.215.5 %   Platelets 242 150 - 400 K/uL  Comprehensive metabolic panel   Collection Time: 05/27/14  9:05 AM  Result Value Ref Range   Sodium 137 135 - 145 mmol/[name]   Potassium 4.4 3.5 - 5.1 mmol/[name]   Chloride 105 101 - 111 mmol/[name]   CO2 26 22 - 32 mmol/[name]   Glucose, Bld 118 (H) 65 - 99 mg/dL   BUN 11 6 - 20 mg/dL   Creatinine, Ser 4.400.80 0.44 - 1.00 mg/dL   Calcium 8.8 ([name]) 8.9 - 10.3 mg/dL   Total Protein 6.3 ([name]) 6.5 - 8.1 g/dL   Albumin 3.7 3.5 - 5.0 g/dL   AST 22 15 - 41 U/[name]   ALT 25 14 - 54 U/[name]   Alkaline Phosphatase 90 38 - 126 U/[name]   Total Bilirubin 0.4 0.3 - 1.2 mg/dL   GFR calc non Af Amer >60 >60 mL/min   GFR calc Af Amer >60 >60 mL/min   Anion gap 6 5 - 15  Urinalysis, Routine w reflex microscopic   Collection Time: 05/27/14  9:05 AM  Result Value Ref Range   Color, Urine YELLOW YELLOW   APPearance CLEAR CLEAR   Specific Gravity, Urine >1.030 (H) 1.005 - 1.030   pH 5.0 5.0 - 8.0   Glucose, UA NEGATIVE NEGATIVE mg/dL   Hgb urine dipstick MODERATE (A) NEGATIVE   Bilirubin Urine NEGATIVE NEGATIVE   Ketones, ur NEGATIVE NEGATIVE mg/dL   Protein, ur NEGATIVE NEGATIVE mg/dL   Urobilinogen, UA 0.2 0.0 - 1.0 mg/dL   Nitrite NEGATIVE NEGATIVE   Leukocytes, UA TRACE (A) NEGATIVE  Urine microscopic-add on   Collection Time: 05/27/14  9:05 AM  Result Value Ref Range   Squamous Epithelial / LPF MANY (A) RARE   WBC, UA 0-2 <3 WBC/hpf  hCG, quantitative, pregnancy   Collection Time: 05/27/14  9:30 AM  Result Value Ref Range   hCG, Beta Chain, Quant, S <1 <5 mIU/mL    EKG: No orders found for this or any previous visit.  Imaging Studies: No results found.    Assessment: High grade cervical dysplasia Patient Active Problem List   Diagnosis Date Noted  . Acanthosis nigricans 04/29/2014  . Morbid obesity 02/13/2014  .  Fibromyalgia 04/03/2012  . Asthma 04/03/2012  . Kidney stones 04/03/2012  . Right  anterior knee pain 08/29/2011  . Periumbilical pain 05/29/2010  . NAUSEA AND VOMITING 01/23/2010  . DIARRHEA 01/23/2010  . RUQ PAIN 01/23/2010  . Syringomyelia and syringobulbia 03/17/2008  . Chronic back pain 03/17/2008    Plan: Laser conization of the cervix  Damien Cisar H 06/01/2014 7:27 AM

## 2014-06-01 NOTE — Anesthesia Preprocedure Evaluation (Signed)
Anesthesia Evaluation  Patient identified by MRN, date of birth, ID band Patient awake    Reviewed: Allergy & Precautions, H&P , NPO status , Patient's Chart, lab work & pertinent test results  History of Anesthesia Complications (+) history of anesthetic complications (brief laryngospasm p extubation)  Airway Mallampati: II  TM Distance: >3 FB Neck ROM: Full    Dental  (+) Teeth Intact   Pulmonary asthma (inactive) , Current Smoker,  breath sounds clear to auscultation        Cardiovascular negative cardio ROS  Rhythm:Regular     Neuro/Psych PSYCHIATRIC DISORDERS (chronic pain syndrome) Depression  Neuromuscular disease    GI/Hepatic GERD-  Medicated and Controlled,  Endo/Other  Morbid obesity  Renal/GU Renal disease (ureteral stone)     Musculoskeletal  (+) Fibromyalgia -  Abdominal   Peds  Hematology   Anesthesia Other Findings   Reproductive/Obstetrics                             Anesthesia Physical Anesthesia Plan  ASA: II  Anesthesia Plan: General   Post-op Pain Management:    Induction: Intravenous  Airway Management Planned: LMA  Additional Equipment:   Intra-op Plan:   Post-operative Plan: Extubation in OR  Informed Consent: I have reviewed the patients History and Physical, chart, labs and discussed the procedure including the risks, benefits and alternatives for the proposed anesthesia with the patient or authorized representative who has indicated his/her understanding and acceptance.     Plan Discussed with:   Anesthesia Plan Comments:         Anesthesia Quick Evaluation

## 2014-06-01 NOTE — Discharge Instructions (Signed)
Conization of the Cervix  Cervical conization is the cutting (excision) of a cone-shaped portion of the cervix. The procedure is performed through the vagina in either your health care provider's office or an operating room. This procedure is usually done when there is abnormal bleeding from the cervix. It can also be done to evaluate an abnormal Pap test or if an abnormality is seen on the cervix during an exam. The tissue is then examined to see if there are precancerous cells or cancer present.   Conization of the cervix is not done during a menstrual period or pregnancy.   LET YOUR HEALTH CARE PROVIDER KNOW ABOUT:  · Any allergies you have.    · All medicines you are taking, including vitamins, herbs, eye drops, creams, and over-the-counter medicines.    · Previous problems you or members of your family have had with the use of anesthetics.    · Any blood disorders you have.    · Previous surgeries you have had.    · Medical conditions you have.    · Your smoking habits.    · The possibility of being pregnant.    RISKS AND COMPLICATIONS   Generally, conization of the cervix is a safe procedure. However, as with any procedure, complications can occur. Possible complications include:  · Heavy bleeding several days or weeks after the procedure. Light bleeding or spotting after the procedure is normal.  · Infection (rare).  · Damage to the cervix or surrounding organs (uncommon).    · Problems with the anesthesia.    · Increased risk of preterm labor in future pregnancies.  BEFORE THE PROCEDURE  · Do not eat or drink anything for 6-8 hours before the procedure.    · Do not take aspirin or blood thinners for at least a week before the procedure or as directed by your health care provider.    · Arrange for someone to take you home after the procedure.    PROCEDURE  There are three different methods to perform conization of the cervix. These include:   · The cold knife method. In this method a small cone-shaped sample  of tissue is cut out with a knife (scalpel) from the cervical canal and the transformation zone (where the normal cells end and the abnormal cells begin).    · The LEEP method. In this method a small cone-shaped sample of tissue is cut out with a thin wire that can burn (cauterize) the cervical tissue with an electrical current.    · Laser treatment. In this method a small cone-shaped sample of tissue is cut out and then cauterized with a laser beam to prevent bleeding.    The procedure will be performed as follows:   · Depending on the method, you will either be given a medicine to make you sleep (general anesthetic) or a numbing medicine (local anesthetic). A medicine that numbs the cervix (cervical block) may be given.    · A lubricated device called a speculum will be inserted into the vagina to spread open the walls of the vagina. This will help your health care provider see the inside of the vagina and cervix better.    · The tissue from the cervix will be removed and examined.    · The results of the procedure will help your health care provider decide if further treatment is necessary. They will also help your health care provider decide on the best treatment if your results are abnormal.  AFTER THE PROCEDURE  · If you had a general anesthetic, you may be groggy for 2-3 hours after   to 3 weeks.   You may have some cramping for about 1 week.   You may have bloody discharge or light bleeding for 1-2 weeks.   You may have black discharge coming from the vagina. This is from the paste used on the cervix to prevent bleeding. This is normal discharge.

## 2014-06-01 NOTE — Anesthesia Postprocedure Evaluation (Addendum)
  Anesthesia Post-op Note  Patient: [name] [name]  Procedure(s) Performed: Procedure(s): LASER CONIZATION OF CERVIX (N/A) HOLMIUM LASER APPLICATION (N/A)  Patient Location: PACU  Anesthesia Type:General  Level of Consciousness: awake, alert  and oriented  Airway and Oxygen Therapy: Patient Spontanous Breathing  Post-op Pain: none  Post-op Assessment: Post-op Vital signs reviewed, Patient's Cardiovascular Status Stable, Respiratory Function Stable, Patent Airway and No signs of Nausea or vomiting  Post-op Vital Signs: Reviewed and stable  Last Vitals:  Filed Vitals:   06/01/14 1019  BP: 142/97  Pulse: 100  Temp: 36.4 C  Resp: 14    Complications: No apparent anesthesia complications, late entry

## 2014-06-01 NOTE — OR Nursing (Signed)
Difficult IV  Stick,  Picc nurse accessed right antecubital with use of ultrasound.   Patient had total of 9 sticks

## 2014-06-01 NOTE — Anesthesia Procedure Notes (Signed)
Procedure Name: LMA Insertion Date/Time: 06/01/2014 8:14 AM Performed by: [name] [name], [name] [name]: Patient identified, Patient being monitored, Emergency Drugs available, Timeout performed and Suction available Patient Re-evaluated:Patient Re-evaluated prior to inductionOxygen Delivery Method: Circle System Utilized Preoxygenation: Pre-oxygenation with 100% oxygen Intubation Type: IV induction Ventilation: Mask ventilation without difficulty LMA: LMA inserted LMA Size: 4.0 Number of attempts: 1 Placement Confirmation: positive ETCO2 and breath sounds checked- equal and bilateral

## 2014-06-02 ENCOUNTER — Encounter (HOSPITAL_COMMUNITY): Payer: Self-pay | Admitting: Obstetrics & Gynecology

## 2014-06-07 ENCOUNTER — Ambulatory Visit (INDEPENDENT_AMBULATORY_CARE_PROVIDER_SITE_OTHER): Payer: BLUE CROSS/BLUE SHIELD | Admitting: Obstetrics & Gynecology

## 2014-06-07 ENCOUNTER — Encounter: Payer: Self-pay | Admitting: Obstetrics & Gynecology

## 2014-06-07 ENCOUNTER — Telehealth: Payer: Self-pay | Admitting: *Deleted

## 2014-06-07 VITALS — BP 110/70 | HR 92 | Ht 60.5 in | Wt 276.0 lb

## 2014-06-07 DIAGNOSIS — Z9889 Other specified postprocedural states: Secondary | ICD-10-CM

## 2014-06-07 MED ORDER — DOXYCYCLINE HYCLATE 50 MG PO CAPS: 100.0000 mg | ORAL_CAPSULE | Freq: Two times a day (BID) | ORAL | Status: DC

## 2014-06-07 NOTE — Telephone Encounter (Signed)
I spoke with Dr. Despina HiddenEure before calling the pt and he advised that it is ok to have a little spotting because he medicated her cervix again but if the bleeding got worse to call us back and we would work her in. I advised the pt of this and she verbalized understanding.

## 2014-06-07 NOTE — Addendum Note (Signed)
Addended by: Lazaro ArmsEURE, LUTHER H on: 06/07/2014 11:22 AM   Modules accepted: Orders

## 2014-06-07 NOTE — Progress Notes (Signed)
Patient ID: [name] [name], female   DOB: Nov 27, 1990, 24 y.o.   MRN: [id number]  HPI: Patient returns for routine postoperative follow-up having undergone laser conization of cervix  on 06/01/2014.  The patient's immediate postoperative recovery has been unremarkable. Since hospital discharge the patient reports no problems.  Specifically no bleeding Please note I reviewed with the patient the issue with the laser speculum and gave her a copy of my op note and read it aloud to her in the spirit of full disclosure   Current Outpatient Prescriptions: albuterol (PROAIR HFA) 108 (90 BASE) MCG/ACT inhaler, Inhale 2 puffs into the lungs every 4 (four) hours as needed for wheezing or shortness of breath., Disp: 1 Inhaler, Rfl: 2 cyclobenzaprine (FLEXERIL) 10 MG tablet, Take 1 tablet (10 mg total) by mouth 3 (three) times daily as needed for muscle spasms., Disp: 90 tablet, Rfl: 2 gabapentin (NEURONTIN) 300 MG capsule, Take 1 capsule (300 mg total) by mouth 2 (two) times daily., Disp: 60 capsule, Rfl: 2 HYDROcodone-acetaminophen (NORCO/VICODIN) 5-325 MG per tablet, Take 1 tablet by mouth every 6 (six) hours as needed. Caution drowsiness (Patient taking differently: Take 1 tablet by mouth every 6 (six) hours as needed for moderate pain. Caution drowsiness), Disp: 48 tablet, Rfl: 0 HYDROcodone-acetaminophen (NORCO/VICODIN) 5-325 MG per tablet, Take 1 tablet by mouth every 6 (six) hours as needed., Disp: 30 tablet, Rfl: 0 ketorolac (TORADOL) 10 MG tablet, Take 1 tablet (10 mg total) by mouth every 8 (eight) hours as needed. (Patient not taking: Reported on 06/07/2014), Disp: 15 tablet, Rfl: 0 ondansetron (ZOFRAN) 8 MG tablet, Take 1 tablet (8 mg total) by mouth every 8 (eight) hours as needed for nausea. (Patient not taking: Reported on 06/07/2014), Disp: 12 tablet, Rfl: 0  No current facility-administered medications for this visit.    Blood pressure 110/70, pulse 92, height 5' 0.5" (1.537 m), weight 276 lb  (125.193 kg).  Physical Exam: Cervix dry eschar in place Monsel's placed  Diagnostic Tests: none  Pathology: HSIL negative margins  Impression: S/P laser conization of cervix  Plan: Follow up 1 month  Follow up: 1  months  Lazaro ArmsEURE,Jamira Barfuss H, MD

## 2014-06-07 NOTE — Addendum Note (Signed)
Addended by: Richardson ChiquitoRAVIS, ASHLEY M on: 06/07/2014 11:38 AM   Modules accepted: Orders

## 2014-06-08 ENCOUNTER — Encounter: Payer: Self-pay | Admitting: Obstetrics & Gynecology

## 2014-06-08 ENCOUNTER — Ambulatory Visit (INDEPENDENT_AMBULATORY_CARE_PROVIDER_SITE_OTHER): Payer: BLUE CROSS/BLUE SHIELD | Admitting: Obstetrics & Gynecology

## 2014-06-08 VITALS — BP 120/80 | HR 108 | Wt 276.0 lb

## 2014-06-08 DIAGNOSIS — Z9889 Other specified postprocedural states: Secondary | ICD-10-CM

## 2014-06-08 NOTE — Progress Notes (Signed)
Patient ID: [name] JeffersonAmber L Bray, female   DOB: 1990/10/01, 24 y.o.   MRN: [id number] Had some bleeding after I saw her yesterday  Exam Minimal blood in vault Monsel's reapplied to laser cone bed  Keep appt as scheduled Follow up prn Care instructions reviewed

## 2014-06-22 ENCOUNTER — Ambulatory Visit: Payer: BLUE CROSS/BLUE SHIELD

## 2014-07-05 ENCOUNTER — Ambulatory Visit (INDEPENDENT_AMBULATORY_CARE_PROVIDER_SITE_OTHER): Payer: BLUE CROSS/BLUE SHIELD | Admitting: Obstetrics & Gynecology

## 2014-07-05 ENCOUNTER — Encounter: Payer: Self-pay | Admitting: Obstetrics & Gynecology

## 2014-07-05 VITALS — BP 110/80 | HR 80 | Wt 274.0 lb

## 2014-07-05 DIAGNOSIS — Z9889 Other specified postprocedural states: Secondary | ICD-10-CM

## 2014-07-05 DIAGNOSIS — R87613 High grade squamous intraepithelial lesion on cytologic smear of cervix (HGSIL): Secondary | ICD-10-CM

## 2014-07-05 NOTE — Progress Notes (Signed)
Patient ID: [name] [name], female   DOB: 05/19/1990, 24 y.o.   MRN: [id number]  HPI: Patient returns for routine postoperative follow-up having undergone laser conization of the cervix  on May 2018.  The patient's immediate postoperative recovery has been unremarkable. Since hospital discharge the patient reports some post op cervical bleeding.   Current Outpatient Prescriptions: cyclobenzaprine (FLEXERIL) 10 MG tablet, Take 1 tablet (10 mg total) by mouth 3 (three) times daily as needed for muscle spasms., Disp: 90 tablet, Rfl: 2 gabapentin (NEURONTIN) 300 MG capsule, Take 1 capsule (300 mg total) by mouth 2 (two) times daily., Disp: 60 capsule, Rfl: 2 HYDROcodone-acetaminophen (NORCO/VICODIN) 5-325 MG per tablet, Take 1 tablet by mouth every 6 (six) hours as needed. Caution drowsiness (Patient taking differently: Take 1 tablet by mouth every 6 (six) hours as needed for moderate pain. Caution drowsiness), Disp: 48 tablet, Rfl: 0 albuterol (PROAIR HFA) 108 (90 BASE) MCG/ACT inhaler, Inhale 2 puffs into the lungs every 4 (four) hours as needed for wheezing or shortness of breath. (Patient not taking: Reported on 06/08/2014), Disp: 1 Inhaler, Rfl: 2 doxycycline (VIBRAMYCIN) 50 MG capsule, Take 2 capsules (100 mg total) by mouth 2 (two) times daily. (Patient not taking: Reported on 07/05/2014), Disp: 40 capsule, Rfl: 0 HYDROcodone-acetaminophen (NORCO/VICODIN) 5-325 MG per tablet, Take 1 tablet by mouth every 6 (six) hours as needed. (Patient not taking: Reported on 06/08/2014), Disp: 30 tablet, Rfl: 0 ketorolac (TORADOL) 10 MG tablet, Take 1 tablet (10 mg total) by mouth every 8 (eight) hours as needed. (Patient not taking: Reported on 06/07/2014), Disp: 15 tablet, Rfl: 0 ondansetron (ZOFRAN) 8 MG tablet, Take 1 tablet (8 mg total) by mouth every 8 (eight) hours as needed for nausea. (Patient not taking: Reported on 06/07/2014), Disp: 12 tablet, Rfl: 0  No current facility-administered medications for this  visit.    Blood pressure 110/80, pulse 80, weight 274 lb (124.286 kg).  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

## 2014-07-25 ENCOUNTER — Ambulatory Visit: Payer: BLUE CROSS/BLUE SHIELD | Admitting: Nurse Practitioner

## 2014-07-26 ENCOUNTER — Ambulatory Visit (INDEPENDENT_AMBULATORY_CARE_PROVIDER_SITE_OTHER): Payer: BLUE CROSS/BLUE SHIELD | Admitting: Nurse Practitioner

## 2014-07-26 ENCOUNTER — Encounter: Payer: Self-pay | Admitting: Nurse Practitioner

## 2014-07-26 VITALS — BP 116/82 | Ht 60.0 in | Wt 275.4 lb

## 2014-07-26 DIAGNOSIS — Z79891 Long term (current) use of opiate analgesic: Secondary | ICD-10-CM

## 2014-07-26 DIAGNOSIS — G950 Syringomyelia and syringobulbia: Secondary | ICD-10-CM | POA: Diagnosis not present

## 2014-07-26 DIAGNOSIS — G8929 Other chronic pain: Secondary | ICD-10-CM | POA: Diagnosis not present

## 2014-07-26 DIAGNOSIS — M549 Dorsalgia, unspecified: Secondary | ICD-10-CM

## 2014-07-26 MED ORDER — HYDROCODONE-ACETAMINOPHEN 5-325 MG PO TABS: 1.0000 | ORAL_TABLET | Freq: Four times a day (QID) | ORAL | Status: DC | PRN

## 2014-07-26 MED ORDER — BACLOFEN 10 MG PO TABS: 10.0000 mg | ORAL_TABLET | Freq: Three times a day (TID) | ORAL | Status: DC

## 2014-07-26 NOTE — Patient Instructions (Signed)
-   Melatonin 5mg

## 2014-07-28 ENCOUNTER — Encounter: Payer: Self-pay | Admitting: Nurse Practitioner

## 2014-07-28 DIAGNOSIS — Z79891 Long term (current) use of opiate analgesic: Secondary | ICD-10-CM | POA: Insufficient documentation

## 2014-07-28 NOTE — Progress Notes (Signed)
Subjective:  Presents for recheck on her chronic back pain. Flexeril is no longer working for her muscle spasms, requesting a different medication. Has a stressful job. Some insomnia. Does not take any medication for this. Takes 0-3 pain pills per day depending on level of pain usually related to activity particularly at work. This allows her to continue working and performing responsibilities at home. she was given a few extra pain pills after her cervical conization in May. No extra medicine since.  Objective:   BP 116/82 mmHg  Ht 5' (1.524 m)  Wt 275 lb 6 oz (124.909 kg)  BMI 53.78 kg/m2 NAD. Alert, oriented. Lungs clear. Heart regular rate rhythm.  Assessment:  Problem List Items Addressed This Visit      Nervous and Auditory   Syringomyelia and syringobulbia (Chronic)     Other   Chronic back pain (Chronic)   Relevant Medications   baclofen (LIORESAL) 10 MG tablet   HYDROcodone-acetaminophen (NORCO/VICODIN) 5-325 MG per tablet   Encounter for long-term opiate analgesic use - Primary     Plan:  Meds ordered this encounter  Medications  . baclofen (LIORESAL) 10 MG tablet    Sig: Take 1 tablet (10 mg total) by mouth 3 (three) times daily. Prn muscle spasms    Dispense:  30 each    Refill:  0    Order Specific Question:  Supervising Provider    Answer:  Merlyn AlbertLUKING, WILLIAM S [[id number]]  . DISCONTD: HYDROcodone-acetaminophen (NORCO/VICODIN) 5-325 MG per tablet    Sig: Take 1 tablet by mouth every 6 (six) hours as needed. Caution drowsiness    Dispense:  48 tablet    Refill:  0    May fill 60 days from 07/26/14    Order Specific Question:  Supervising Provider    Answer:  Merlyn AlbertLUKING, WILLIAM S [[id number]]  . DISCONTD: HYDROcodone-acetaminophen (NORCO/VICODIN) 5-325 MG per tablet    Sig: Take 1 tablet by mouth every 6 (six) hours as needed. Caution drowsiness    Dispense:  48 tablet    Refill:  0    May fill 30 days from 07/26/14    Order Specific Question:  Supervising Provider    Answer:   Merlyn AlbertLUKING, WILLIAM S [[id number]]  . HYDROcodone-acetaminophen (NORCO/VICODIN) 5-325 MG per tablet    Sig: Take 1 tablet by mouth every 6 (six) hours as needed. Caution drowsiness    Dispense:  48 tablet    Refill:  0    Order Specific Question:  Supervising Provider    Answer:  Merlyn AlbertLUKING, WILLIAM S [[id number]]   Switch to baclofen. Restart use of TENS unit for muscle spasms. Continue to work on weight loss. Try melatonin 5 mg for sleep. If insomnia persists, recommend trial of trazodone. Patient to call us back if symptoms persist. Return in about 3 months (around 10/26/2014) for recheck. Control substances agreement and opioid risk tool completed.

## 2014-08-03 ENCOUNTER — Other Ambulatory Visit: Payer: Self-pay | Admitting: *Deleted

## 2014-08-03 MED ORDER — IVERMECTIN 0.5 % EX LOTN: TOPICAL_LOTION | CUTANEOUS | Status: DC

## 2014-09-15 ENCOUNTER — Encounter: Payer: Self-pay | Admitting: Nurse Practitioner

## 2014-09-15 ENCOUNTER — Encounter: Payer: Self-pay | Admitting: Family Medicine

## 2014-09-15 ENCOUNTER — Ambulatory Visit (INDEPENDENT_AMBULATORY_CARE_PROVIDER_SITE_OTHER): Payer: BLUE CROSS/BLUE SHIELD | Admitting: Nurse Practitioner

## 2014-09-15 VITALS — BP 110/80 | Temp 98.3°F | Ht 60.0 in | Wt 268.0 lb

## 2014-09-15 DIAGNOSIS — A084 Viral intestinal infection, unspecified: Secondary | ICD-10-CM

## 2014-09-15 MED ORDER — ONDANSETRON 8 MG PO TBDP: 8.0000 mg | ORAL_TABLET | Freq: Three times a day (TID) | ORAL | Status: DC | PRN

## 2014-09-15 MED ORDER — BACLOFEN 10 MG PO TABS: 10.0000 mg | ORAL_TABLET | Freq: Three times a day (TID) | ORAL | Status: DC

## 2014-09-16 ENCOUNTER — Encounter: Payer: Self-pay | Admitting: Nurse Practitioner

## 2014-09-16 NOTE — Progress Notes (Signed)
Subjective:   Presents with complaints of sudden onset chills vomiting and diarrhea that began earlier this morning. Last vomiting episode was about 30 minutes ago. Occasional abdominal pain. Off-and-on fever. Mild headache only with vomiting. Taking sips of clear liquids. Has  Voided this morning. No dysuria.  Objective:   BP 110/80 mmHg  Temp(Src) 98.3 F (36.8 C) (Oral)  Ht 5' (1.524 m)  Wt 268 lb (121.564 kg)  BMI 52.34 kg/m2  NAD. Alert, oriented. TMs normal limit. Pharynx clear moist. Neck supple with minimal adenopathy. Lungs clear. Heart regular rate rhythm. Abdomen soft nondistended with active bowel sounds 4; minimal epigastric area tenderness. No rebound or guarding.  Assessment: Viral gastroenteritis  Plan:  Meds ordered this encounter  Medications  . ondansetron (ZOFRAN-ODT) 8 MG disintegrating tablet    Sig: Take 1 tablet (8 mg total) by mouth every 8 (eight) hours as needed for nausea or vomiting.    Dispense:  30 tablet    Refill:  0    Order Specific Question:  Supervising Provider    Answer:  Merlyn Albert [[id number]]  . baclofen (LIORESAL) 10 MG tablet    Sig: Take 1 tablet (10 mg total) by mouth 3 (three) times daily. Prn muscle spasms    Dispense:  30 each    Refill:  0    Order Specific Question:  Supervising Provider    Answer:  Merlyn Albert [[id number]]     reviewed dietary measures , symptomatic care and warning signs. Call back in a.m. If symptoms persist, sooner if worse.

## 2014-09-24 IMAGING — NM NM HEPATO W/GB/PHARM/[PERSON_NAME]
2 series · 12 of 12 positions shown · non-contrast
Comparison: 01/25/2004

Correlation:  CT abdomen 04/03/2012

RADIOPHARMACEUTICALS:  5 mIi7c-MMm Choletec

CLINICAL DATA: Abdominal pain, nausea

EXAM:
NUCLEAR MEDICINE HEPATOBILIARY IMAGING WITH GALLBLADDER EF
TECHNIQUE: Sequential images of the abdomen were obtained [DATE] minutes
following intravenous administration of radiopharmaceutical. After
slow intravenous infusion of 2.54 micrograms Cholecystokinin,
gallbladder ejection fraction was determined.

[hida · 3.20mm/px · 6 of 60 frames shown (1 of 2)]
[frame 6/60]
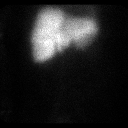
[frame 16/60]
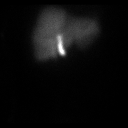
[frame 26/60]
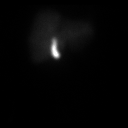
[frame 36/60]
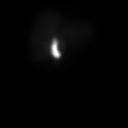
[frame 46/60]
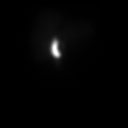
[frame 56/60]
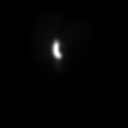

[hida · 3.20mm/px · 6 of 30 frames shown (2 of 2)]
[frame 3/30]
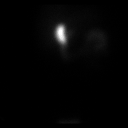
[frame 8/30]
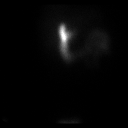
[frame 13/30]
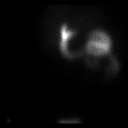
[frame 18/30]
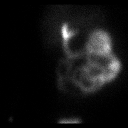
[frame 23/30]
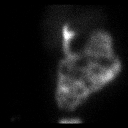
[frame 28/30]
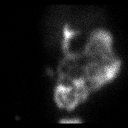

[12 of 12 positions shown; findings below may reference images not displayed]

FINDINGS: Normal tracer extraction from bloodstream indicating normal
hepatocellular function.

Prompt excretion of tracer into biliary tree.

Gallbladder appears to overlie the CBD on the anterior view,
confirmed on prior CT.

Gallbladder visualizes by 12 min.

Small bowel not visualized until following CCK stimulation.

Normal emptying of tracer from the gallbladder recurrence following
CCK administration.

Normal gallbladder ejection fraction of 93%.

No symptoms following CCK.
IMPRESSION: Normal exam.

## 2014-10-03 ENCOUNTER — Emergency Department (HOSPITAL_COMMUNITY)
Admission: EM | Admit: 2014-10-03 | Discharge: 2014-10-03 | Disposition: A | Discharge status: Home / Self Care | Payer: BLUE CROSS/BLUE SHIELD | Attending: Emergency Medicine | Admitting: Emergency Medicine

## 2014-10-03 ENCOUNTER — Encounter (HOSPITAL_COMMUNITY): Payer: Self-pay | Admitting: *Deleted

## 2014-10-03 ENCOUNTER — Emergency Department (HOSPITAL_COMMUNITY): Payer: BLUE CROSS/BLUE SHIELD

## 2014-10-03 DIAGNOSIS — Z8739 Personal history of other diseases of the musculoskeletal system and connective tissue: Secondary | ICD-10-CM | POA: Insufficient documentation

## 2014-10-03 DIAGNOSIS — F419 Anxiety disorder, unspecified: Secondary | ICD-10-CM | POA: Insufficient documentation

## 2014-10-03 DIAGNOSIS — J45901 Unspecified asthma with (acute) exacerbation: Secondary | ICD-10-CM | POA: Diagnosis not present

## 2014-10-03 DIAGNOSIS — Z8659 Personal history of other mental and behavioral disorders: Secondary | ICD-10-CM | POA: Insufficient documentation

## 2014-10-03 DIAGNOSIS — Q059 Spina bifida, unspecified: Secondary | ICD-10-CM | POA: Diagnosis not present

## 2014-10-03 DIAGNOSIS — G8929 Other chronic pain: Secondary | ICD-10-CM | POA: Diagnosis not present

## 2014-10-03 DIAGNOSIS — Z880 Allergy status to penicillin: Secondary | ICD-10-CM | POA: Insufficient documentation

## 2014-10-03 DIAGNOSIS — Z87442 Personal history of urinary calculi: Secondary | ICD-10-CM | POA: Diagnosis not present

## 2014-10-03 DIAGNOSIS — Z79899 Other long term (current) drug therapy: Secondary | ICD-10-CM | POA: Insufficient documentation

## 2014-10-03 DIAGNOSIS — R079 Chest pain, unspecified: Secondary | ICD-10-CM | POA: Diagnosis present

## 2014-10-03 DIAGNOSIS — Z8719 Personal history of other diseases of the digestive system: Secondary | ICD-10-CM | POA: Diagnosis not present

## 2014-10-03 DIAGNOSIS — Z720 Tobacco use: Secondary | ICD-10-CM | POA: Insufficient documentation

## 2014-10-03 DIAGNOSIS — R0789 Other chest pain: Secondary | ICD-10-CM | POA: Insufficient documentation

## 2014-10-03 MED ORDER — IBUPROFEN 800 MG PO TABS: 800.0000 mg | ORAL_TABLET | Freq: Once | ORAL | Status: AC

## 2014-10-03 MED ORDER — IBUPROFEN 800 MG PO TABS: 800.0000 mg | ORAL_TABLET | Freq: Three times a day (TID) | ORAL | Status: DC

## 2014-10-03 MED ORDER — IBUPROFEN 800 MG PO TABS
800.0000 mg | ORAL_TABLET | Freq: Once | ORAL | Status: AC
  Administered 2014-10-03: 800 mg via ORAL
  Filled 2014-10-03: qty 1

## 2014-10-03 NOTE — ED Notes (Signed)
Pt c/o knot to middle of chest with palpation; pt states she has also had sob that has gotten progressively worse since 1200 today

## 2014-10-03 NOTE — Discharge Instructions (Signed)

## 2014-10-04 NOTE — ED Provider Notes (Signed)
CSN: [id number]     Arrival date & time 10/03/14  1928 History   First MD Initiated Contact with Patient 10/03/14 2120     Chief Complaint  Patient presents with  . Shortness of Breath      HPI  Patient presents for evaluation of chest pain and shortness of breath. States she is at work. Alteplase states he was "really stressful" she felt a "lump under the skin over my sternum". States "it's gone now". Progressive shortness of breath today. Slowly she got quite anxious. However she is insistent that this was something that she can feel with her hand just imagine that her tightness in her chest. Dry nonproductive cough yesterday but not today no fevers chills or other new symptoms.  Past Medical History  Diagnosis Date  . Fibromyalgia   . Asthma   . Syringomyelia     thoracic spine  . GERD (gastroesophageal reflux disease)   . Chronic pain     Dr. Murray Hodgkins in [place]  . Chronic back pain   . DDD (degenerative disc disease)   . Scoliosis   . Bulging disc   . Spinal bifida, closed   . Depression   . Renal disorder     kidney stones  . Kidney stone   . Complication of anesthesia     larynaspasm after extubation following Cholecystectomy  . Popping of temporomandibular joint on opening of jaw    Past Surgical History  Procedure Laterality Date  . Renal lithiasis removed    . Esophagogastroduodenoscopy  01/2010    circumferential distal esophageal erosions, hiatal hernia, excoriations/erosion in gastric body ?trauma from vomiting, bx negative and showed chronic gastritis  . Colonoscopy  01/2010    scattered petechiae and fibrotic-appearing mucosa of uncertain significance , ?resolving infection, bx benign  . Breath hydrogen test  08/08/2010       . Hydrogen breath test  08/06/2010    Procedure: HYDROGEN BREATH TEST;  Surgeon: Corbin Ade, MD;  Location: [facility];  Service: Gastroenterology;  Laterality: N/A;  . Cystoscopy w/ ureteral stent placement  05/28/2011    Procedure: CYSTOSCOPY  WITH RETROGRADE PYELOGRAM/URETERAL STENT PLACEMENT;  Surgeon: Ky Barban, MD;  Location: [facility];  Service: Urology;  Laterality: Right;  Cystoscopy with Right Retrograde Pyelogram  . Stone extraction with basket  05/28/2011    Procedure: STONE EXTRACTION WITH BASKET;  Surgeon: Ky Barban, MD;  Location: [facility];  Service: Urology;  Laterality: Right;  . Cystoscopy with urethral dilatation  05/28/2011    Procedure: CYSTOSCOPY WITH URETHRAL DILATATION;  Surgeon: Ky Barban, MD;  Location: [facility];  Service: Urology;  Laterality: N/A;  . Esophagogastroduodenoscopy N/A 02/17/2013    Procedure: ESOPHAGOGASTRODUODENOSCOPY (EGD);  Surgeon: Corbin Ade, MD;  Location: [facility];  Service: Endoscopy;  Laterality: N/A;  11:30-moved to [address] to notify pt  . Cholecystectomy N/A 03/26/2013    Procedure: LAPAROSCOPIC CHOLECYSTECTOMY;  Surgeon: Dalia Heading, MD;  Location: [facility];  Service: General;  Laterality: N/A;  . Cervical conization w/bx N/A 06/01/2014    Procedure: LASER CONIZATION OF CERVIX;  Surgeon: Lazaro Arms, MD;  Location: [facility];  Service: Gynecology;  Laterality: N/A;  . Holmium laser application N/A 06/01/2014    Procedure: HOLMIUM LASER APPLICATION;  Surgeon: Lazaro Arms, MD;  Location: [facility];  Service: Gynecology;  Laterality: N/A;   Family History  Problem Relation Age of Onset  . Multiple sclerosis Father   . Cervical  cancer Mother   . Hypertension Mother   . Anesthesia problems Neg Hx   . Hypotension Neg Hx   . Malignant hyperthermia Neg Hx   . Pseudochol deficiency Neg Hx   . Colon cancer Neg Hx   . Heart disease    . Lung disease    . Asthma    . Diabetes    . Kidney disease     Social History  Substance Use Topics  . Smoking status: Current Every Day Smoker -- 1.00 packs/day for 10 years    Types: Cigarettes  . Smokeless tobacco: Never Used  . Alcohol Use: 0.0 oz/week     Comment: rare   OB History    Gravida Para Term Preterm AB TAB  SAB Ectopic Multiple Living   Review of Systems  Constitutional: Negative for fever, chills, diaphoresis, appetite change and fatigue.  HENT: Negative for mouth sores, sore throat and trouble swallowing.   Eyes: Negative for visual disturbance.  Respiratory: Positive for shortness of breath. Negative for cough, chest tightness and wheezing.   Cardiovascular: Positive for chest pain.  Gastrointestinal: Negative for nausea, vomiting, abdominal pain, diarrhea and abdominal distention.  Endocrine: Negative for polydipsia, polyphagia and polyuria.  Genitourinary: Negative for dysuria, frequency and hematuria.  Musculoskeletal: Negative for gait problem.  Skin: Negative for color change, pallor and rash.  Neurological: Negative for dizziness, syncope, light-headedness and headaches.  Hematological: Does not bruise/bleed easily.  Psychiatric/Behavioral: Negative for behavioral problems and confusion.      Allergies  Sulfamethoxazole; Penicillins; Sulfa antibiotics; and Sulfonamide derivatives  Home Medications   Prior to Admission medications   Medication Sig Start Date End Date Taking? Authorizing Provider  albuterol (PROAIR HFA) 108 (90 BASE) MCG/ACT inhaler Inhale 2 puffs into the lungs every 4 (four) hours as needed for wheezing or shortness of breath. Patient not taking: Reported on 06/08/2014 05/27/14   Babs Sciara, MD  baclofen (LIORESAL) 10 MG tablet Take 1 tablet (10 mg total) by mouth 3 (three) times daily. Prn muscle spasms 09/15/14   Campbell Riches, NP  HYDROcodone-acetaminophen (NORCO/VICODIN) 5-325 MG per tablet Take 1 tablet by mouth every 6 (six) hours as needed. Caution drowsiness 07/26/14   Campbell Riches, NP  ibuprofen (ADVIL,MOTRIN) 800 MG tablet Take 1 tablet (800 mg total) by mouth 3 (three) times daily. 10/03/14   Rolland Porter, MD  ondansetron (ZOFRAN-ODT) 8 MG disintegrating tablet Take 1 tablet (8 mg total) by mouth every 8 (eight) hours as  needed for nausea or vomiting. 09/15/14   Campbell Riches, NP   BP 118/89 mmHg  Pulse 92  Temp(Src) 97.6 F (36.4 C) (Oral)  Resp 20  Ht 5' (1.524 m)  Wt 265 lb (120.203 kg)  BMI 51.75 kg/m2  SpO2 99% Physical Exam  Constitutional: She is oriented to person, place, and time. She appears well-developed and well-nourished. No distress.  HENT:  Head: Normocephalic.  Eyes: Conjunctivae are normal. Pupils are equal, round, and reactive to light. No scleral icterus.  Neck: Normal range of motion. Neck supple. No thyromegaly present.  Cardiovascular: Normal rate and regular rhythm.  Exam reveals no gallop and no friction rub.   No murmur heard. Pulmonary/Chest: Effort normal and breath sounds normal. No respiratory distress. She has no wheezes. She has no rales.  Resting comfortably with her eyes closed later the room. Normal exam of the chest clear lungs. No palpable masses  or abnormalities along the sternal border sternum or anterior chest.  Abdominal: Soft. Bowel sounds are normal. She exhibits no distension. There is no tenderness. There is no rebound.  Musculoskeletal: Normal range of motion.  Neurological: She is alert and oriented to person, place, and time.  Skin: Skin is warm and dry. No rash noted.  Psychiatric: She has a normal mood and affect. Her behavior is normal.    ED Course  Procedures (including critical care time) Labs Review Labs Reviewed - No data to display  Imaging Review Dg Chest 2 View  10/03/2014   CLINICAL DATA:  Acute shortness of breath and chest pain today.  EXAM: CHEST  2 VIEW  COMPARISON:  11/18/2007 chest radiograph  FINDINGS: The cardiomediastinal silhouette is unremarkable.  There is no evidence of focal airspace disease, pulmonary edema, suspicious pulmonary nodule/mass, pleural effusion, or pneumothorax. No acute bony abnormalities are identified.  IMPRESSION: No active cardiopulmonary disease.   Electronically Signed   By: Harmon Pier M.D.   On:  10/03/2014 22:49   I have personally reviewed and evaluated these images and lab results as part of my medical decision-making.   EKG Interpretation None      MDM   Final diagnoses:  Chest wall pain    Normal x-ray. Reassuring exam. Likely episode of anxiety versus costochondritis. Plan is expectant management. Motrin as needed.    Rolland Porter, MD 10/04/14 [phone]

## 2014-10-05 ENCOUNTER — Encounter: Payer: Self-pay | Admitting: Nurse Practitioner

## 2014-10-05 ENCOUNTER — Ambulatory Visit (INDEPENDENT_AMBULATORY_CARE_PROVIDER_SITE_OTHER): Payer: BLUE CROSS/BLUE SHIELD | Admitting: Nurse Practitioner

## 2014-10-05 VITALS — BP 142/96 | Ht 60.0 in | Wt 261.0 lb

## 2014-10-05 DIAGNOSIS — F418 Other specified anxiety disorders: Secondary | ICD-10-CM | POA: Diagnosis not present

## 2014-10-05 MED ORDER — ETONOGESTREL-ETHINYL ESTRADIOL 0.12-0.015 MG/24HR VA RING: VAGINAL_RING | VAGINAL | Status: DC

## 2014-10-05 MED ORDER — ESCITALOPRAM OXALATE 10 MG PO TABS: 10.0000 mg | ORAL_TABLET | Freq: Every day | ORAL | Status: DC

## 2014-10-05 MED ORDER — CLONAZEPAM 0.5 MG PO TABS
0.5000 mg | ORAL_TABLET | Freq: Two times a day (BID) | ORAL | Status: DC | PRN
Start: 2014-10-05 — End: 2015-02-24

## 2014-10-06 DIAGNOSIS — F418 Other specified anxiety disorders: Secondary | ICD-10-CM | POA: Insufficient documentation

## 2014-10-06 NOTE — Progress Notes (Signed)
Subjective:  Presents for follow-up on her anxiety. Was seen at local ED on 9/19 for a knot in the upper mid chest area. Diagnosed with chest wall pain. Not has resolved but area remains sore with touch and movement. Has noticed increase anxiety for the past 3-4 weeks. Part of her stress comes from work although this may be improving in the future due to a change in her job responsibilities. Also having significant stress related to her family, continues to live with her mother and stepfather; problems related to her new stepfather. Has had some counseling in the past which worked well. Complaints of fatigue, sleep disturbance including early morning awakenings, emotional lability, social isolation and decreased appetite. Denies any suicidal thoughts or ideation. Boyfriend is present today. Patient request NuvaRing as contraceptive.  Objective:   BP 142/96 mmHg  Ht 5' (1.524 m)  Wt 261 lb (118.389 kg)  BMI 50.97 kg/m2 NAD. Alert, oriented. Lungs clear. Heart regular rate rhythm. Thoughts logical coherent and relevant. Dressed appropriately. Crying at times during office visit.  Assessment:  Problem List Items Addressed This Visit      Other   Depression with anxiety - Primary     Plan:  Meds ordered this encounter  Medications  . escitalopram (LEXAPRO) 10 MG tablet    Sig: Take 1 tablet (10 mg total) by mouth daily.    Dispense:  30 tablet    Refill:  2    Order Specific Question:  Supervising Allyanna Appleman    Answer:  Merlyn Albert [[id number]]  . etonogestrel-ethinyl estradiol (NUVARING) 0.12-0.015 MG/24HR vaginal ring    Sig: Insert vaginally and leave in place for 3 consecutive weeks, then remove for 1 week.    Dispense:  1 each    Refill:  12    Order Specific Question:  Supervising Emerie Vanderkolk    Answer:  Merlyn Albert [[id number]]  . clonazePAM (KLONOPIN) 0.5 MG tablet    Sig: Take 1 tablet (0.5 mg total) by mouth 2 (two) times daily as needed for anxiety.    Dispense:  30 tablet   Refill:  0    Order Specific Question:  Supervising Orvetta Danielski    Answer:  Merlyn Albert [[id number]]  . ondansetron (ZOFRAN-ODT) 8 MG disintegrating tablet    Sig: TAKE 1 TABLET (8 MG TOTAL) BY MOUTH EVERY 8 (EIGHT) HOURS AS NEEDED FOR NAUSEA OR VOMITING.    Refill:  0    Discussed importance of stress reduction. Patient is looking into moving out on her own. Defers counseling at this time. Has taken Lexapro without difficulty in the past, states this worked very well. Given prescription for Klonopin but explained that it is a category X for pregnancy, explaining that she must be on reliable birth control if she decides to take this medicine. Use sparingly. Restart NuvaRing as directed. Seek help immediately if any suicidal ideation. Return in about 3 months (around 01/04/2015) for recheck. Call back sooner if needed.

## 2014-10-19 ENCOUNTER — Encounter: Payer: Self-pay | Admitting: Nurse Practitioner

## 2014-10-19 ENCOUNTER — Other Ambulatory Visit: Payer: Self-pay | Admitting: Nurse Practitioner

## 2014-10-24 ENCOUNTER — Other Ambulatory Visit: Payer: Self-pay | Admitting: Nurse Practitioner

## 2014-10-24 MED ORDER — HYDROCODONE-ACETAMINOPHEN 5-325 MG PO TABS: 1.0000 | ORAL_TABLET | Freq: Four times a day (QID) | ORAL | Status: DC | PRN

## 2014-10-26 ENCOUNTER — Encounter: Payer: Self-pay | Admitting: Nurse Practitioner

## 2014-10-26 ENCOUNTER — Ambulatory Visit (INDEPENDENT_AMBULATORY_CARE_PROVIDER_SITE_OTHER): Payer: BLUE CROSS/BLUE SHIELD | Admitting: Nurse Practitioner

## 2014-10-26 VITALS — BP 132/86 | Ht 60.0 in | Wt 263.0 lb

## 2014-10-26 DIAGNOSIS — F418 Other specified anxiety disorders: Secondary | ICD-10-CM

## 2014-10-26 DIAGNOSIS — M549 Dorsalgia, unspecified: Secondary | ICD-10-CM | POA: Diagnosis not present

## 2014-10-26 DIAGNOSIS — M797 Fibromyalgia: Secondary | ICD-10-CM

## 2014-10-26 DIAGNOSIS — Z79891 Long term (current) use of opiate analgesic: Secondary | ICD-10-CM | POA: Diagnosis not present

## 2014-10-26 DIAGNOSIS — G8929 Other chronic pain: Secondary | ICD-10-CM

## 2014-10-26 MED ORDER — HYDROCODONE-ACETAMINOPHEN 5-325 MG PO TABS: 1.0000 | ORAL_TABLET | Freq: Four times a day (QID) | ORAL | Status: DC | PRN

## 2014-10-26 MED ORDER — BACLOFEN 10 MG PO TABS: 10.0000 mg | ORAL_TABLET | Freq: Three times a day (TID) | ORAL | Status: DC

## 2014-10-27 ENCOUNTER — Encounter: Payer: Self-pay | Admitting: Nurse Practitioner

## 2014-10-27 NOTE — Progress Notes (Signed)
Subjective:  Presents for recheck on her pain management. Taking pain med on a when necessary basis, does not need it every day. Takes no more than 1-3 per day depending on pain level. Stress level much improved. Her job has changed. Also is moving out of her parents house. Has had a flareup of her back pain and fibromyalgia over the past month. Has cut back her smoking from one pack per day to 3 packs every 2 weeks. Plans to work more on her weight loss and activity. This patient was seen today for chronic pain  The medication list was reviewed and updated.   -Compliance with medication: yes  - Number patient states they take daily: 1-3 at most  -when was the last dose patient took? Not today  The patient was advised the importance of maintaining medication and not using illegal substances with these.  Refills needed: yes  The patient was educated that we can provide 3 monthly scripts for their medication, it is their responsibility to follow the instructions.  Side effects or complications from medications: none  Patient is aware that pain medications are meant to minimize the severity of the pain to allow their pain levels to improve to allow for better function. They are aware of that pain medications cannot totally remove their pain.  Due for UDT ( at least once per year) : hold at this time     Objective:   BP 132/86 mmHg  Ht 5' (1.524 m)  Wt 263 lb (119.296 kg)  BMI 51.36 kg/m2  NAD. Alert, oriented. Lungs clear. Heart regular rate rhythm. Cheerful affect.  Assessment:  Problem List Items Addressed This Visit      Musculoskeletal and Integument   Fibromyalgia (Chronic)     Other   Chronic back pain (Chronic)   Relevant Medications   baclofen (LIORESAL) 10 MG tablet   HYDROcodone-acetaminophen (NORCO/VICODIN) 5-325 MG tablet   Depression with anxiety   Encounter for long-term opiate analgesic use - Primary   Morbid obesity (HCC)     Plan:  Meds ordered this  encounter  Medications  . baclofen (LIORESAL) 10 MG tablet    Sig: Take 1 tablet (10 mg total) by mouth 3 (three) times daily. Prn muscle spasms    Dispense:  30 each    Refill:  0    Order Specific Question:  Supervising Provider    Answer:  Merlyn AlbertLUKING, WILLIAM S [[id number]]  . DISCONTD: HYDROcodone-acetaminophen (NORCO/VICODIN) 5-325 MG tablet    Sig: Take 1 tablet by mouth every 6 (six) hours as needed. Caution drowsiness    Dispense:  48 tablet    Refill:  0    May fill 30 days from 10/24/14    Order Specific Question:  Supervising Provider    Answer:  Merlyn AlbertLUKING, WILLIAM S [[id number]]  . HYDROcodone-acetaminophen (NORCO/VICODIN) 5-325 MG tablet    Sig: Take 1 tablet by mouth every 6 (six) hours as needed. Caution drowsiness    Dispense:  48 tablet    Refill:  0    May fill 60 days from 10/24/14    Order Specific Question:  Supervising Provider    Answer:  Merlyn AlbertLUKING, WILLIAM S [[id number]]    continue weight loss and activity efforts. Use no more than 48 tablets per month. Return in about 3 months (around 01/26/2015) for recheck.

## 2014-10-29 ENCOUNTER — Other Ambulatory Visit: Payer: Self-pay | Admitting: Family Medicine

## 2014-11-24 ENCOUNTER — Encounter: Payer: Self-pay | Admitting: Family Medicine

## 2014-11-24 ENCOUNTER — Ambulatory Visit (INDEPENDENT_AMBULATORY_CARE_PROVIDER_SITE_OTHER): Payer: BLUE CROSS/BLUE SHIELD | Admitting: Family Medicine

## 2014-11-24 VITALS — BP 122/70 | Temp 98.4°F | Ht 60.5 in | Wt 262.0 lb

## 2014-11-24 DIAGNOSIS — J019 Acute sinusitis, unspecified: Secondary | ICD-10-CM

## 2014-11-24 DIAGNOSIS — B9689 Other specified bacterial agents as the cause of diseases classified elsewhere: Secondary | ICD-10-CM

## 2014-11-24 MED ORDER — CEFPROZIL 500 MG PO TABS: 500.0000 mg | ORAL_TABLET | Freq: Two times a day (BID) | ORAL | Status: DC

## 2014-11-24 MED ORDER — ONDANSETRON HCL 8 MG PO TABS: 8.0000 mg | ORAL_TABLET | Freq: Three times a day (TID) | ORAL | Status: DC | PRN

## 2014-11-24 NOTE — Progress Notes (Signed)
   Subjective:    Patient ID: [name] JeffersonAmber L Bray, female    DOB: [birth year], 24 y.o.   MRN: [id number]  Cough This is a new problem. Episode onset: 1 week. Associated symptoms include headaches. Associated symptoms comments: Nausea, vomiting. Treatments tried: benadryl, mucinex.   Symptoms been present over the past 7 days worse over the past couple days   Review of Systems  Respiratory: Positive for cough.   Neurological: Positive for headaches.   Patient relates head congestion drainage coughing nausea some vomiting related to the coughing    Objective:   Physical Exam  Mild sinus tenderness eardrums normal throat is normal neck is supple lungs clear no crackles or wheezes      Assessment & Plan:  Viral syndrome secondary rhinosinusitis antibiotics prescribed warning signs discussed follow-up ongoing trouble.

## 2014-12-05 ENCOUNTER — Emergency Department (HOSPITAL_COMMUNITY)
Admission: EM | Admit: 2014-12-05 | Discharge: 2014-12-06 | Disposition: A | Discharge status: Home / Self Care | Payer: BLUE CROSS/BLUE SHIELD | Attending: Emergency Medicine | Admitting: Emergency Medicine

## 2014-12-05 ENCOUNTER — Encounter (HOSPITAL_COMMUNITY): Payer: Self-pay | Admitting: Emergency Medicine

## 2014-12-05 DIAGNOSIS — J45909 Unspecified asthma, uncomplicated: Secondary | ICD-10-CM | POA: Diagnosis not present

## 2014-12-05 DIAGNOSIS — Z87828 Personal history of other (healed) physical injury and trauma: Secondary | ICD-10-CM | POA: Diagnosis not present

## 2014-12-05 DIAGNOSIS — Z8719 Personal history of other diseases of the digestive system: Secondary | ICD-10-CM | POA: Insufficient documentation

## 2014-12-05 DIAGNOSIS — M419 Scoliosis, unspecified: Secondary | ICD-10-CM | POA: Insufficient documentation

## 2014-12-05 DIAGNOSIS — F17210 Nicotine dependence, cigarettes, uncomplicated: Secondary | ICD-10-CM | POA: Insufficient documentation

## 2014-12-05 DIAGNOSIS — Z87442 Personal history of urinary calculi: Secondary | ICD-10-CM | POA: Diagnosis not present

## 2014-12-05 DIAGNOSIS — F410 Panic disorder [episodic paroxysmal anxiety] without agoraphobia: Secondary | ICD-10-CM | POA: Insufficient documentation

## 2014-12-05 DIAGNOSIS — G8929 Other chronic pain: Secondary | ICD-10-CM | POA: Diagnosis not present

## 2014-12-05 DIAGNOSIS — Z880 Allergy status to penicillin: Secondary | ICD-10-CM | POA: Diagnosis not present

## 2014-12-05 DIAGNOSIS — N939 Abnormal uterine and vaginal bleeding, unspecified: Secondary | ICD-10-CM

## 2014-12-05 DIAGNOSIS — F329 Major depressive disorder, single episode, unspecified: Secondary | ICD-10-CM | POA: Diagnosis not present

## 2014-12-05 DIAGNOSIS — Z792 Long term (current) use of antibiotics: Secondary | ICD-10-CM | POA: Diagnosis not present

## 2014-12-05 NOTE — ED Provider Notes (Signed)
CSN: [id number]     Arrival date & time 12/05/14  2329 History   First MD Initiated Contact with Patient 12/05/14 2337     Chief Complaint  Patient presents with  . Vaginal Bleeding     (Consider location/radiation/quality/duration/timing/severity/associated sxs/prior Treatment) Patient is a 24 y.o. female presenting with vaginal bleeding. The history is provided by the patient.  Vaginal Bleeding Quality:  Bright red Severity:  Severe Timing:  Constant Progression:  Worsening Chronicity:  New Menstrual history:  Regular Number of tampons used:  3 Context: spontaneously   Relieved by:  None tried Worsened by:  Nothing tried Ineffective treatments:  None tried Associated symptoms: no back pain, no dizziness, no fever and no nausea  Abdominal pain: occasional cramping.   Risk factors: unprotected sex    [name] [name] is a 24 y.o. G1P1001 who presents to the ED with heavy vaginal bleeding. LMP 2 weeks ago and lasted 4 days then started bleeding again yesterday. Tonight bleeding became head at 9 pm.and she has used 3 tampons in 3 hours. She also passed clots when she went to the toilet. Current sex partner x 2 months, unprotected sex. Hx of conization for abnormal pap smear.    Past Medical History  Diagnosis Date  . Fibromyalgia   . Asthma   . Syringomyelia (HCC)     thoracic spine  . GERD (gastroesophageal reflux disease)   . Chronic pain     Dr. Murray Hodgkins in [place]  . Chronic back pain   . DDD (degenerative disc disease)   . Scoliosis   . Bulging disc   . Spinal bifida, closed   . Depression   . Renal disorder     kidney stones  . Kidney stone   . Complication of anesthesia     larynaspasm after extubation following Cholecystectomy  . Popping of temporomandibular joint on opening of jaw   . Panic attack    Past Surgical History  Procedure Laterality Date  . Renal lithiasis removed    . Esophagogastroduodenoscopy  01/2010    circumferential distal esophageal erosions,  hiatal hernia, excoriations/erosion in gastric body ?trauma from vomiting, bx negative and showed chronic gastritis  . Colonoscopy  01/2010    scattered petechiae and fibrotic-appearing mucosa of uncertain significance , ?resolving infection, bx benign  . Breath hydrogen test  08/08/2010       . Hydrogen breath test  08/06/2010    Procedure: HYDROGEN BREATH TEST;  Surgeon: Corbin Ade, MD;  Location: [facility];  Service: Gastroenterology;  Laterality: N/A;  . Cystoscopy w/ ureteral stent placement  05/28/2011    Procedure: CYSTOSCOPY WITH RETROGRADE PYELOGRAM/URETERAL STENT PLACEMENT;  Surgeon: Ky Barban, MD;  Location: [facility];  Service: Urology;  Laterality: Right;  Cystoscopy with Right Retrograde Pyelogram  . Stone extraction with basket  05/28/2011    Procedure: STONE EXTRACTION WITH BASKET;  Surgeon: Ky Barban, MD;  Location: [facility];  Service: Urology;  Laterality: Right;  . Cystoscopy with urethral dilatation  05/28/2011    Procedure: CYSTOSCOPY WITH URETHRAL DILATATION;  Surgeon: Ky Barban, MD;  Location: [facility];  Service: Urology;  Laterality: N/A;  . Esophagogastroduodenoscopy N/A 02/17/2013    Procedure: ESOPHAGOGASTRODUODENOSCOPY (EGD);  Surgeon: Corbin Ade, MD;  [facility]: [facility];  Service: Endoscopy;  Laterality: N/A;  11:30-moved to [address] to notify pt  . Cholecystectomy N/A 03/26/2013    Procedure: LAPAROSCOPIC CHOLECYSTECTOMY;  Surgeon: Dalia Heading, MD;  Location: [facility];  Service: General;  Laterality: N/A;  . Cervical conization w/bx N/A 06/01/2014    Procedure: LASER CONIZATION OF CERVIX;  Surgeon: Lazaro ArmsLuther H Eure, MD;  Location: [facility];  Service: Gynecology;  Laterality: N/A;  . Holmium laser application N/A 06/01/2014    Procedure: HOLMIUM LASER APPLICATION;  Surgeon: Lazaro ArmsLuther H Eure, MD;  Location: [facility];  Service: Gynecology;  Laterality: N/A;   Family History  Problem Relation Age of Onset  . Multiple sclerosis Father   . Cervical cancer  Mother   . Hypertension Mother   . Anesthesia problems Neg Hx   . Hypotension Neg Hx   . Malignant hyperthermia Neg Hx   . Pseudochol deficiency Neg Hx   . Colon cancer Neg Hx   . Heart disease    . Lung disease    . Asthma    . Diabetes    . Kidney disease     Social History  Substance Use Topics  . Smoking status: Current Every Day Smoker -- 1.00 packs/day for 10 years    Types: Cigarettes  . Smokeless tobacco: Never Used  . Alcohol Use: 0.0 oz/week     Comment: rare   OB History    Gravida Para Term Preterm AB TAB SAB Ectopic Multiple Living   1 1 1       1      Review of Systems  Constitutional: Negative for fever.  Gastrointestinal: Negative for nausea. Abdominal pain: occasional cramping.  Genitourinary: Positive for vaginal bleeding.  Musculoskeletal: Negative for back pain.  Neurological: Negative for dizziness.      Allergies  Sulfamethoxazole; Penicillins; Sulfa antibiotics; and Sulfonamide derivatives  Home Medications   Prior to Admission medications   Medication Sig Start Date End Date Taking? Authorizing Provider  baclofen (LIORESAL) 10 MG tablet Take 1 tablet (10 mg total) by mouth 3 (three) times daily. Prn muscle spasms 10/26/14  Yes Campbell Richesarolyn C [name], NP  clonazePAM (KLONOPIN) 0.5 MG tablet Take 1 tablet (0.5 mg total) by mouth 2 (two) times daily as needed for anxiety. 10/05/14  Yes Campbell Richesarolyn C [name], NP  HYDROcodone-acetaminophen (NORCO/VICODIN) 5-325 MG tablet Take 1 tablet by mouth every 6 (six) hours as needed. Caution drowsiness 10/26/14  Yes Campbell Richesarolyn C [name], NP  ondansetron (ZOFRAN) 8 MG tablet Take 1 tablet (8 mg total) by mouth every 8 (eight) hours as needed for nausea. 11/24/14  Yes Babs SciaraScott A Luking, MD  PROAIR HFA 108 (90 BASE) MCG/ACT inhaler INHALE 2 PUFFS INTO THE LUNGS EVERY 4 (FOUR) HOURS AS NEEDED FOR WHEEZING OR SHORTNESS OF BREATH. 10/31/14  Yes Babs SciaraScott A Luking, MD  cefPROZIL (CEFZIL) 500 MG tablet Take 1 tablet (500 mg total) by  mouth 2 (two) times daily. 11/24/14   Babs SciaraScott A Luking, MD  etonogestrel-ethinyl estradiol (NUVARING) 0.12-0.015 MG/24HR vaginal ring Insert vaginally and leave in place for 3 consecutive weeks, then remove for 1 week. 10/05/14   Campbell Richesarolyn C [name], NP  megestrol (MEGACE) 40 MG tablet Take 1 tablet (40 mg total) by mouth daily. 12/06/14   Rael Tilly Orlene OchM Leniyah Martell, NP   BP 122/79 mmHg  Pulse 95  Temp(Src) 97.7 F (36.5 C)  Resp 20  Ht 5' (1.524 m)  Wt 118.842 kg  BMI 51.17 kg/m2  SpO2 100%  LMP 12/04/2014 Physical Exam  Constitutional: She is oriented to person, place, and time. She appears well-developed and well-nourished. No distress.  HENT:  Head: Normocephalic and atraumatic.  Eyes: EOM are normal.  Neck: Neck supple.  Cardiovascular: Normal rate  and regular rhythm.   Pulmonary/Chest: Effort normal and breath sounds normal.  Abdominal: Soft. Bowel sounds are normal. There is no tenderness.  Genitourinary:  External genitalia without lesions. Moderate blood vaginal vault. No vaginal laceration noted, no cervical lesions noted. No CMT, no adnexal tenderness, uterus without palpable enlargement.  Musculoskeletal: Normal range of motion.  Neurological: She is alert and oriented to person, place, and time. No cranial nerve deficit.  Skin: Skin is warm and dry.  Psychiatric: She has a normal mood and affect. Her behavior is normal.  Nursing note and vitals reviewed.   ED Course  Procedures (including critical care time) Labs Review Results for orders placed or performed during the hospital encounter of 12/05/14 (from the past 24 hour(s))  CBC with Differential     Status: Abnormal   Collection Time: 12/06/14 12:00 AM  Result Value Ref Range   WBC 11.5 (H) 4.0 - 10.5 K/uL   RBC 5.03 3.87 - 5.11 MIL/uL   Hemoglobin 15.1 (H) 12.0 - 15.0 g/dL   HCT 16.1 09.6 - 04.5 %   MCV 87.7 78.0 - 100.0 fL   MCH 30.0 26.0 - 34.0 pg   MCHC 34.2 30.0 - 36.0 g/dL   RDW 40.9 81.1 - 91.4 %   Platelets 296 150  - 400 K/uL   Neutrophils Relative % 61 %   Neutro Abs 7.1 1.7 - 7.7 K/uL   Lymphocytes Relative 25 %   Lymphs Abs 2.9 0.7 - 4.0 K/uL   Monocytes Relative 9 %   Monocytes Absolute 1.0 0.1 - 1.0 K/uL   Eosinophils Relative 4 %   Eosinophils Absolute 0.4 0.0 - 0.7 K/uL   Basophils Relative 1 %   Basophils Absolute 0.1 0.0 - 0.1 K/uL  Basic metabolic panel     Status: Abnormal   Collection Time: 12/06/14 12:00 AM  Result Value Ref Range   Sodium 142 135 - 145 mmol/L   Potassium 3.5 3.5 - 5.1 mmol/L   Chloride 103 101 - 111 mmol/L   CO2 31 22 - 32 mmol/L   Glucose, Bld 105 (H) 65 - 99 mg/dL   BUN 15 6 - 20 mg/dL   Creatinine, Ser 7.82 (H) 0.44 - 1.00 mg/dL   Calcium 9.8 8.9 - 95.6 mg/dL   GFR calc non Af Amer >60 >60 mL/min   GFR calc Af Amer >60 >60 mL/min   Anion gap 8 5 - 15  Wet prep, genital     Status: Abnormal   Collection Time: 12/06/14 12:03 AM  Result Value Ref Range   Yeast Wet Prep HPF POC NONE SEEN NONE SEEN   Trich, Wet Prep NONE SEEN NONE SEEN   Clue Cells Wet Prep HPF POC NONE SEEN NONE SEEN   WBC, Wet Prep HPF POC FEW (A) NONE SEEN   Sperm NONE SEEN   I-Stat Beta hCG blood, ED ([facility], [facility], [facility] only)     Status: None   Collection Time: 12/06/14 12:09 AM  Result Value Ref Range   I-stat hCG, quantitative <5.0 <5 mIU/mL   Comment 3              MDM  24 y.o. female with vaginal bleeding x 2 days that is between regular cycle. Stable for d/c without dizziness, abdominal pain or anemia. Discussed with the patient clinical and lab findings and plan of care and all questioned fully answered. She will call Dr. Forestine Chute office in the morning for follow up.   Final diagnoses:  Abnormal vaginal bleeding     [facility], NP 12/06/14 1610  Loren Racer, MD 12/06/14 [phone]

## 2014-12-05 NOTE — ED Notes (Signed)
Pt c/o heavy bleeding tonight. Pt states she is soaking tampon every hour.

## 2014-12-06 LAB — CBC WITH DIFFERENTIAL/PLATELET
Basophils Absolute: 0.1 10*3/uL (ref 0.0–0.1)
Basophils Relative: 1 %
EOS ABS: 0.4 10*3/uL (ref 0.0–0.7)
Eosinophils Relative: 4 %
HCT: 44.1 % (ref 36.0–46.0)
HEMOGLOBIN: 15.1 g/dL — AB (ref 12.0–15.0)
LYMPHS ABS: 2.9 10*3/uL (ref 0.7–4.0)
LYMPHS PCT: 25 %
MCH: 30 pg (ref 26.0–34.0)
MCHC: 34.2 g/dL (ref 30.0–36.0)
MCV: 87.7 fL (ref 78.0–100.0)
Monocytes Absolute: 1 10*3/uL (ref 0.1–1.0)
Monocytes Relative: 9 %
NEUTROS PCT: 61 %
Neutro Abs: 7.1 10*3/uL (ref 1.7–7.7)
Platelets: 296 10*3/uL (ref 150–400)
RBC: 5.03 MIL/uL (ref 3.87–5.11)
RDW: 13.5 % (ref 11.5–15.5)
WBC: 11.5 10*3/uL — AB (ref 4.0–10.5)

## 2014-12-06 LAB — WET PREP, GENITAL
CLUE CELLS WET PREP: NONE SEEN
Sperm: NONE SEEN
TRICH WET PREP: NONE SEEN
YEAST WET PREP: NONE SEEN

## 2014-12-06 LAB — BASIC METABOLIC PANEL
ANION GAP: 8 (ref 5–15)
BUN: 15 mg/dL (ref 6–20)
CHLORIDE: 103 mmol/L (ref 101–111)
CO2: 31 mmol/L (ref 22–32)
Calcium: 9.8 mg/dL (ref 8.9–10.3)
Creatinine, Ser: 1.09 mg/dL — ABNORMAL HIGH (ref 0.44–1.00)
GFR calc Af Amer: >60 mL/min (ref >60)
GFR calc non Af Amer: >60 mL/min (ref >60)
Glucose, Bld: 105 mg/dL — ABNORMAL HIGH (ref 65–99)
POTASSIUM: 3.5 mmol/L (ref 3.5–5.1)
SODIUM: 142 mmol/L (ref 135–145)

## 2014-12-06 LAB — I-STAT BETA HCG BLOOD, ED (MC, WL, AP ONLY)

## 2014-12-06 MED ORDER — MEGESTROL ACETATE 40 MG PO TABS: 40.0000 mg | ORAL_TABLET | Freq: Every day | ORAL | Status: DC

## 2014-12-06 MED ORDER — MEGESTROL ACETATE 40 MG PO TABS
ORAL_TABLET | ORAL | Status: AC
  Filled 2014-12-06: qty 1

## 2014-12-06 MED ORDER — MEGESTROL ACETATE 40 MG PO TABS
40.0000 mg | ORAL_TABLET | Freq: Every day | ORAL | Status: DC
  Administered 2014-12-06: 40 mg via ORAL
  Filled 2014-12-06 (×2): qty 1

## 2014-12-06 NOTE — Discharge Instructions (Signed)
Call Dr. Forestine ChuteEure's office in the morning and tell them you were in the ED with heavy bleeding and need a follow up appointment. Your blood work tonight shows that the bleeding you have had has not caused you to be anemic.  We are starting you on medication to help stop the bleeding until you can follow up with Dr. Despina HiddenEure.  Abnormal Uterine Bleeding Abnormal uterine bleeding can affect women at various stages in life, including teenagers, women in their reproductive years, pregnant women, and women who have reached menopause. Several kinds of uterine bleeding are considered abnormal, including:  Bleeding or spotting between periods.   Bleeding after sexual intercourse.   Bleeding that is heavier or more than normal.   Periods that last longer than usual.  Bleeding after menopause.  Many cases of abnormal uterine bleeding are minor and simple to treat, while others are more serious. Any type of abnormal bleeding should be evaluated by your health care provider. Treatment will depend on the cause of the bleeding. HOME CARE INSTRUCTIONS Monitor your condition for any changes. The following actions may help to alleviate any discomfort you are experiencing:  Avoid the use of tampons and douches as directed by your health care provider.  Change your pads frequently. You should get regular pelvic exams and Pap tests. Keep all follow-up appointments for diagnostic tests as directed by your health care provider.  SEEK MEDICAL CARE IF:   Your bleeding lasts more than 1 week.   You feel dizzy at times.  SEEK IMMEDIATE MEDICAL CARE IF:   You pass out.   You are changing pads every 15 to 30 minutes.   You have abdominal pain.  You have a fever.   You become sweaty or weak.   You are passing large blood clots from the vagina.   You start to feel nauseous and vomit. MAKE SURE YOU:   Understand these instructions.  Will watch your condition.  Will get help right away if you are  not doing well or get worse.   This information is not intended to replace advice given to you by your health care provider. Make sure you discuss any questions you have with your health care provider.   Document Released: 12/31/2004 Document Revised: 01/05/2013 Document Reviewed: 07/30/2012 Elsevier Interactive Patient Education [employer]2016 [employer] Inc[employer]

## 2014-12-07 ENCOUNTER — Ambulatory Visit (INDEPENDENT_AMBULATORY_CARE_PROVIDER_SITE_OTHER): Payer: BLUE CROSS/BLUE SHIELD | Admitting: Advanced Practice Midwife

## 2014-12-07 ENCOUNTER — Encounter: Payer: Self-pay | Admitting: Advanced Practice Midwife

## 2014-12-07 VITALS — BP 100/60 | HR 76 | Wt 260.0 lb

## 2014-12-07 DIAGNOSIS — N939 Abnormal uterine and vaginal bleeding, unspecified: Secondary | ICD-10-CM

## 2014-12-07 LAB — RPR: RPR Ser Ql: NONREACTIVE

## 2014-12-07 LAB — HIV ANTIBODY (ROUTINE TESTING W REFLEX): HIV Screen 4th Generation wRfx: NONREACTIVE

## 2014-12-07 LAB — GC/CHLAMYDIA PROBE AMP (~~LOC~~) NOT AT ARMC
Chlamydia: NEGATIVE
Neisseria Gonorrhea: NEGATIVE

## 2014-12-07 MED ORDER — PNV PRENATAL PLUS MULTIVITAMIN 27-1 MG PO TABS: 1.0000 | ORAL_TABLET | Freq: Every day | ORAL | Status: DC

## 2014-12-07 NOTE — Progress Notes (Signed)
[facility] Visit  Patient name: [name] [name] MRN [id number]  Date of birth: [birth year]  CC & HPI:  [name] [name] is a 24 y.o. Caucasian female presenting today for F/U from ED 11/21.  Had a regularly scheduled period for 4 days, then stopped for a few days, then had 8 days of heavy bleeding.  Took only 1 Megace yesterday, as rx'd by ED, and bleeding has stopped (most likely coincidence).  Trying to get pregnant. Hgb was 11.3  Pertinent History Reviewed:  Medical & Surgical Hx:   Past Medical History  Diagnosis Date  . Fibromyalgia   . Asthma   . Syringomyelia (HCC)     thoracic spine  . GERD (gastroesophageal reflux disease)   . Chronic pain     Dr. Murray Hodgkins in [place]  . Chronic back pain   . DDD (degenerative disc disease)   . Scoliosis   . Bulging disc   . Spinal bifida, closed   . Depression   . Renal disorder     kidney stones  . Kidney stone   . Complication of anesthesia     larynaspasm after extubation following Cholecystectomy  . Popping of temporomandibular joint on opening of jaw   . Panic attack    Past Surgical History  Procedure Laterality Date  . Renal lithiasis removed    . Esophagogastroduodenoscopy  01/2010    circumferential distal esophageal erosions, hiatal hernia, excoriations/erosion in gastric body ?trauma from vomiting, bx negative and showed chronic gastritis  . Colonoscopy  01/2010    scattered petechiae and fibrotic-appearing mucosa of uncertain significance , ?resolving infection, bx benign  . Breath hydrogen test  08/08/2010       . Hydrogen breath test  08/06/2010    Procedure: HYDROGEN BREATH TEST;  Surgeon: Corbin Ade, MD;  Location: [facility];  Service: Gastroenterology;  Laterality: N/A;  . Cystoscopy w/ ureteral stent placement  05/28/2011    Procedure: CYSTOSCOPY WITH RETROGRADE PYELOGRAM/URETERAL STENT PLACEMENT;  Surgeon: Ky Barban, MD;  Location: [facility];  Service: Urology;  Laterality: Right;  Cystoscopy with Right  Retrograde Pyelogram  . Stone extraction with basket  05/28/2011    Procedure: STONE EXTRACTION WITH BASKET;  Surgeon: Ky Barban, MD;  Location: [facility];  Service: Urology;  Laterality: Right;  . Cystoscopy with urethral dilatation  05/28/2011    Procedure: CYSTOSCOPY WITH URETHRAL DILATATION;  Surgeon: Ky Barban, MD;  Location: [facility];  Service: Urology;  Laterality: N/A;  . Esophagogastroduodenoscopy N/A 02/17/2013    Procedure: ESOPHAGOGASTRODUODENOSCOPY (EGD);  Surgeon: Corbin Ade, MD;  [facility];  Service: Endoscopy;  Laterality: N/A;  11:30-moved to [address] to notify pt  . Cholecystectomy N/A 03/26/2013    Procedure: LAPAROSCOPIC CHOLECYSTECTOMY;  Surgeon: Dalia Heading, MD;  Location: [facility];  Service: General;  Laterality: N/A;  . Cervical conization w/bx N/A 06/01/2014    Procedure: LASER CONIZATION OF CERVIX;  Surgeon: Lazaro Arms, MD;  Location: [facility];  Service: Gynecology;  Laterality: N/A;  . Holmium laser application N/A 06/01/2014    Procedure: HOLMIUM LASER APPLICATION;  Surgeon: Lazaro Arms, MD;  Location: [facility];  Service: Gynecology;  Laterality: N/A;   Family History  Problem Relation Age of Onset  . Multiple sclerosis Father   . Cervical cancer Mother   . Hypertension Mother   . Anesthesia problems Neg Hx   . Hypotension Neg Hx   . Malignant hyperthermia Neg  Hx   . Pseudochol deficiency Neg Hx   . Colon cancer Neg Hx   . Heart disease    . Lung disease    . Asthma    . Diabetes    . Kidney disease      Current outpatient prescriptions:  .  baclofen (LIORESAL) 10 MG tablet, Take 1 tablet (10 mg total) by mouth 3 (three) times daily. Prn muscle spasms, Disp: 30 each, Rfl: 0 .  HYDROcodone-acetaminophen (NORCO/VICODIN) 5-325 MG tablet, Take 1 tablet by mouth every 6 (six) hours as needed. Caution drowsiness, Disp: 48 tablet, Rfl: 0 .  megestrol (MEGACE) 40 MG tablet, Take 1 tablet (40 mg total) by mouth daily., Disp: 20 tablet,  Rfl: 0 .  clonazePAM (KLONOPIN) 0.5 MG tablet, Take 1 tablet (0.5 mg total) by mouth 2 (two) times daily as needed for anxiety. (Patient not taking: Reported on 12/07/2014), Disp: 30 tablet, Rfl: 0 .  Prenatal Vit-Fe Fumarate-FA (PNV PRENATAL PLUS MULTIVITAMIN) 27-1 MG TABS, Take 1 tablet by mouth daily., Disp: 30 tablet, Rfl: 11 .  PROAIR HFA 108 (90 BASE) MCG/ACT inhaler, INHALE 2 PUFFS INTO THE LUNGS EVERY 4 (FOUR) HOURS AS NEEDED FOR WHEEZING OR SHORTNESS OF BREATH. (Patient not taking: Reported on 12/07/2014), Disp: 8.5 Inhaler, Rfl: 2 Social History: Reviewed -  reports that she has been smoking Cigarettes.  She has a 10 pack-year smoking history. She has never used smokeless tobacco.  Review of Systems:   Constitutional: Negative for fever and chills Eyes: Negative for visual disturbances Respiratory: Negative for shortness of breath, dyspnea Cardiovascular: Negative for chest pain or palpitations  Gastrointestinal: Negative for vomiting, diarrhea and constipation; no abdominal pain Genitourinary: Negative for dysuria and urgency, vaginal irritation or itching Musculoskeletal: Negative for back pain, joint pain, myalgias  Neurological: Negative for dizziness and headaches    Objective Findings:    Physical Examination: General appearance - well appearing, and in no distress Mental status - alert, oriented to person, place, and time Chest:  Normal respiratory effort Heart - normal rate and regular rhythm Abdomen:  Soft, nontender Pelvic: Scant amount of blood, cx non friable Musculoskeletal:  Normal range of motion without pain Extremities:  No edema    No results found for this or any previous visit (from the past 24 hour(s)).    Assessment & Plan:  A:   AUB, resolved P:  Start PNV.  If cycle remains abnormal, consider 1 month of COC's   Return if symptoms worsen or fail to improve.  CRESENZO-DISHMAN,Carliyah Cotterman CNM 12/07/2014 9:08 AM

## 2014-12-13 ENCOUNTER — Encounter (HOSPITAL_COMMUNITY): Payer: Self-pay | Admitting: Emergency Medicine

## 2014-12-13 ENCOUNTER — Telehealth: Payer: Self-pay | Admitting: Advanced Practice Midwife

## 2014-12-13 ENCOUNTER — Emergency Department (HOSPITAL_COMMUNITY)
Admission: EM | Admit: 2014-12-13 | Discharge: 2014-12-13 | Disposition: A | Discharge status: Home / Self Care | Payer: BLUE CROSS/BLUE SHIELD | Attending: Emergency Medicine | Admitting: Emergency Medicine

## 2014-12-13 DIAGNOSIS — F17210 Nicotine dependence, cigarettes, uncomplicated: Secondary | ICD-10-CM | POA: Diagnosis not present

## 2014-12-13 DIAGNOSIS — J45909 Unspecified asthma, uncomplicated: Secondary | ICD-10-CM | POA: Diagnosis not present

## 2014-12-13 DIAGNOSIS — Z8739 Personal history of other diseases of the musculoskeletal system and connective tissue: Secondary | ICD-10-CM | POA: Diagnosis not present

## 2014-12-13 DIAGNOSIS — Z880 Allergy status to penicillin: Secondary | ICD-10-CM | POA: Insufficient documentation

## 2014-12-13 DIAGNOSIS — Z8659 Personal history of other mental and behavioral disorders: Secondary | ICD-10-CM | POA: Insufficient documentation

## 2014-12-13 DIAGNOSIS — Z3202 Encounter for pregnancy test, result negative: Secondary | ICD-10-CM | POA: Insufficient documentation

## 2014-12-13 DIAGNOSIS — G8929 Other chronic pain: Secondary | ICD-10-CM | POA: Diagnosis not present

## 2014-12-13 DIAGNOSIS — N938 Other specified abnormal uterine and vaginal bleeding: Secondary | ICD-10-CM

## 2014-12-13 DIAGNOSIS — Z87442 Personal history of urinary calculi: Secondary | ICD-10-CM | POA: Diagnosis not present

## 2014-12-13 DIAGNOSIS — Q059 Spina bifida, unspecified: Secondary | ICD-10-CM | POA: Diagnosis not present

## 2014-12-13 DIAGNOSIS — Z8719 Personal history of other diseases of the digestive system: Secondary | ICD-10-CM | POA: Diagnosis not present

## 2014-12-13 DIAGNOSIS — Z79899 Other long term (current) drug therapy: Secondary | ICD-10-CM | POA: Diagnosis not present

## 2014-12-13 DIAGNOSIS — N939 Abnormal uterine and vaginal bleeding, unspecified: Secondary | ICD-10-CM | POA: Diagnosis present

## 2014-12-13 LAB — CBC WITH DIFFERENTIAL/PLATELET
BASOS ABS: 0.1 10*3/uL (ref 0.0–0.1)
BASOS PCT: 1 %
EOS ABS: 0.5 10*3/uL (ref 0.0–0.7)
Eosinophils Relative: 5 %
HEMATOCRIT: 39.3 % (ref 36.0–46.0)
HEMOGLOBIN: 13.7 g/dL (ref 12.0–15.0)
Lymphocytes Relative: 21 %
Lymphs Abs: 2.3 10*3/uL (ref 0.7–4.0)
MCH: 30 pg (ref 26.0–34.0)
MCHC: 34.9 g/dL (ref 30.0–36.0)
MCV: 86.2 fL (ref 78.0–100.0)
MONO ABS: 0.5 10*3/uL (ref 0.1–1.0)
Monocytes Relative: 5 %
NEUTROS ABS: 7.5 10*3/uL (ref 1.7–7.7)
NEUTROS PCT: 68 %
Platelets: 272 10*3/uL (ref 150–400)
RBC: 4.56 MIL/uL (ref 3.87–5.11)
RDW: 13.2 % (ref 11.5–15.5)
WBC: 10.9 10*3/uL — ABNORMAL HIGH (ref 4.0–10.5)

## 2014-12-13 LAB — HCG, SERUM, QUALITATIVE: PREG SERUM: NEGATIVE

## 2014-12-13 MED ORDER — FERROUS SULFATE 325 (65 FE) MG PO TABS: 325.0000 mg | ORAL_TABLET | Freq: Every day | ORAL | Status: DC

## 2014-12-13 MED ORDER — SODIUM CHLORIDE 0.9 % IV BOLUS (SEPSIS)
1000.0000 mL | Freq: Once | INTRAVENOUS | Status: AC
  Administered 2014-12-13: 1000 mL via INTRAVENOUS

## 2014-12-13 MED ORDER — MEGESTROL ACETATE 40 MG PO TABS
40.0000 mg | ORAL_TABLET | Freq: Every day | ORAL | Status: DC
  Administered 2014-12-13: 40 mg via ORAL
  Filled 2014-12-13 (×2): qty 1

## 2014-12-13 NOTE — ED Provider Notes (Addendum)
CSN: [id number]     Arrival date & time 12/13/14  1451 History   First MD Initiated Contact with Patient 12/13/14 1629     Chief Complaint  Patient presents with  . Vaginal Bleeding      HPI  Patient presents with evaluation of vaginal bleeding. Seen and evaluated here a week ago. Given a prescription for Megace. Had been given a dosage in the emergency room.  Seen by OB/GYN in the next day her bleeding had improved. Was advised to not fill or take the Megace. However, within 24 hours bleeding had resumed. Using multiple tampons or pads per day. Lightheaded today and presents here. No pain. No discharge. No fever.  Ricki RodriguezRivas he was on Depo-Provera. Her last dose was in March or April. She had regular menses starting in July 3-4 days per month. Had that this month. Then had 1 day or 2 no bleeding before heavier bleeding with multiple pads and tampons and clots per day started about one week ago  Past Medical History  Diagnosis Date  . Fibromyalgia   . Asthma   . Syringomyelia (HCC)     thoracic spine  . GERD (gastroesophageal reflux disease)   . Chronic pain     Dr. [place]artko [place]  . Chronic back pain   . DDD (degenerative disc disease)   . Scoliosis   . Bulging disc   . Spinal bifida, closed   . Depression   . Renal disorder     kidney stones  . Kidney stone   . Complication of anesthesia     larynaspasm after extubation following Cholecystectomy  . Popping of temporomandibular joint on opening of jaw   . Panic attack    Past Surgical History  Procedure Laterality Date  . Renal lithiasis removed    . Esophagogastroduodenoscopy  01/2010    circumferential distal esophageal erosions, hiatal hernia, excoriations/erosion in gastric body ?trauma from vomiting, bx negative and showed chronic gastritis  . Colonoscopy  01/2010    scattered petechiae and fibrotic-appearing mucosa of uncertain significance , ?resolving infection, bx benign  . Breath hydrogen test  08/08/2010       .  Hydrogen breath test  08/06/2010    Procedure: HYDROGEN BREATH TEST;  Surgeon: Corbin Adeobert M Rourk, MD;  Location: [facility];  Service: Gastroenterology;  Laterality: N/A;  . Cystoscopy w/ ureteral stent placement  05/28/2011    Procedure: CYSTOSCOPY WITH RETROGRADE PYELOGRAM/URETERAL STENT PLACEMENT;  Surgeon: Ky BarbanMohammad I Javaid, MD;  Location: [facility];  Service: Urology;  Laterality: Right;  Cystoscopy with Right Retrograde Pyelogram  . Stone extraction with basket  05/28/2011    Procedure: STONE EXTRACTION WITH BASKET;  Surgeon: Ky BarbanMohammad I Javaid, MD;  Location: [facility];  Service: Urology;  Laterality: Right;  . Cystoscopy with urethral dilatation  05/28/2011    Procedure: CYSTOSCOPY WITH URETHRAL DILATATION;  Surgeon: Ky BarbanMohammad I Javaid, MD;  Location: [facility];  Service: Urology;  Laterality: N/A;  . Esophagogastroduodenoscopy N/A 02/17/2013    Procedure: ESOPHAGOGASTRODUODENOSCOPY (EGD);  Surgeon: Corbin Adeobert M Rourk, MD;  Location: [facility];  Service: Endoscopy;  Laterality: N/A;  11:30-moved to [address] to notify pt  . Cholecystectomy N/A 03/26/2013    Procedure: LAPAROSCOPIC CHOLECYSTECTOMY;  Surgeon: Dalia HeadingMark A Jenkins, MD;  Location: [facility];  Service: General;  Laterality: N/A;  . Cervical conization w/bx N/A 06/01/2014    Procedure: LASER CONIZATION OF CERVIX;  Surgeon: Lazaro ArmsLuther H Eure, MD;  Location: [facility];  Service: Gynecology;  Laterality:  N/A;  . Holmium laser application N/A 06/01/2014    Procedure: HOLMIUM LASER APPLICATION;  Surgeon: Lazaro Arms, MD;  Location: [facility];  Service: Gynecology;  Laterality: N/A;   Family History  Problem Relation Age of Onset  . Multiple sclerosis Father   . Cervical cancer Mother   . Hypertension Mother   . Anesthesia problems Neg Hx   . Hypotension Neg Hx   . Malignant hyperthermia Neg Hx   . Pseudochol deficiency Neg Hx   . Colon cancer Neg Hx   . Heart disease    . Lung disease    . Asthma    . Diabetes    . Kidney disease     Social History   Substance Use Topics  . Smoking status: Current Every Day Smoker -- 1.00 packs/day for 10 years    Types: Cigarettes  . Smokeless tobacco: Never Used  . Alcohol Use: 0.0 oz/week     Comment: rare   OB History    Gravida Para Term Preterm AB TAB SAB Ectopic Multiple Living   Review of Systems  Constitutional: Negative for fever, chills, diaphoresis, appetite change and fatigue.  HENT: Negative for mouth sores, sore throat and trouble swallowing.   Eyes: Negative for visual disturbance.  Respiratory: Negative for cough, chest tightness, shortness of breath and wheezing.   Cardiovascular: Negative for chest pain.  Gastrointestinal: Negative for nausea, vomiting, abdominal pain, diarrhea and abdominal distention.  Endocrine: Negative for polydipsia, polyphagia and polyuria.  Genitourinary: Positive for vaginal bleeding. Negative for dysuria, frequency and hematuria.  Musculoskeletal: Negative for gait problem.  Skin: Negative for color change, pallor and rash.  Neurological: Positive for dizziness and weakness. Negative for syncope, light-headedness and headaches.  Hematological: Does not bruise/bleed easily.  Psychiatric/Behavioral: Negative for behavioral problems and confusion.      Allergies  Sulfamethoxazole; Penicillins; Sulfa antibiotics; and Sulfonamide derivatives  Home Medications   Prior to Admission medications   Medication Sig Start Date End Date Taking? Authorizing Provider  baclofen (LIORESAL) 10 MG tablet Take 1 tablet (10 mg total) by mouth 3 (three) times daily. Prn muscle spasms 10/26/14  Yes Campbell Riches, NP  diphenhydrAMINE (BENADRYL) 25 MG tablet Take 25-50 mg by mouth daily as needed for itching or allergies.   Yes Historical Provider, MD  HYDROcodone-acetaminophen (NORCO/VICODIN) 5-325 MG tablet Take 1 tablet by mouth every 6 (six) hours as needed. Caution drowsiness 10/26/14  Yes Campbell Riches, NP  Prenatal Vit-Fe Fumarate-FA  (PNV PRENATAL PLUS MULTIVITAMIN) 27-1 MG TABS Take 1 tablet by mouth daily. 12/07/14  Yes Scarlette Calico Cresenzo-Dishmon, CNM  clonazePAM (KLONOPIN) 0.5 MG tablet Take 1 tablet (0.5 mg total) by mouth 2 (two) times daily as needed for anxiety. Patient not taking: Reported on 12/07/2014 10/05/14   Campbell Riches, NP  ferrous sulfate 325 (65 FE) MG tablet Take 1 tablet (325 mg total) by mouth daily with breakfast. 12/13/14   Rolland Porter, MD  megestrol (MEGACE) 40 MG tablet Take 1 tablet (40 mg total) by mouth daily. Patient not taking: Reported on 12/13/2014 12/06/14   Janne Napoleon, NP  PROAIR HFA 108 (90 BASE) MCG/ACT inhaler INHALE 2 PUFFS INTO THE LUNGS EVERY 4 (FOUR) HOURS AS NEEDED FOR WHEEZING OR SHORTNESS OF BREATH. Patient not taking: Reported on 12/07/2014 10/31/14   Babs Sciara, MD   BP 120/82 mmHg  Pulse 76  Temp(Src) 98.2 F (36.8  C) (Oral)  Resp 17  Ht 5' (1.524 m)  Wt 260 lb (117.935 kg)  BMI 50.78 kg/m2  SpO2 97%  LMP 12/04/2014 Physical Exam  Constitutional: She is oriented to person, place, and time. She appears well-developed and well-nourished. No distress.  HENT:  Head: Normocephalic.  Conjunctiva nailbeds do not appear pale.  Eyes: Conjunctivae are normal. Pupils are equal, round, and reactive to light. No scleral icterus.  Neck: Normal range of motion. Neck supple. No thyromegaly present.  Cardiovascular: Normal rate and regular rhythm.  Exam reveals no gallop and no friction rub.   No murmur heard. Pulmonary/Chest: Effort normal and breath sounds normal. No respiratory distress. She has no wheezes. She has no rales.  Abdominal: Soft. Bowel sounds are normal. She exhibits no distension. There is no tenderness. There is no rebound.  Musculoskeletal: Normal range of motion.  Neurological: She is alert and oriented to person, place, and time.  Skin: Skin is warm and dry. No rash noted.  Psychiatric: She has a normal mood and affect. Her behavior is normal.    ED  Course  Procedures (including critical care time) Labs Review Labs Reviewed  CBC WITH DIFFERENTIAL/PLATELET - Abnormal; Notable for the following:    WBC 10.9 (*)    All other components within normal limits  HCG, SERUM, QUALITATIVE    Imaging Review No results found. I have personally reviewed and evaluated these images and lab results as part of my medical decision-making.   EKG Interpretation None      MDM   Final diagnoses:  Dysfunctional uterine bleeding    Not pregnant. Not having pain. Has had 2 pelvic exams in the last week. Do not feel she needs one tonight. Was given IV fluids. Hb stable.  Feels well, negative orthostatics.  Will dc, advised to take megace. Rx Iron, GYN f/u.    Rolland Porter, MD 12/13/14 1836  Rolland Porter, MD 12/13/14 [phone]

## 2014-12-13 NOTE — ED Notes (Addendum)
Pt reports on-going heavy vaginal bleeding and passing clots since last Tuesday. Pt reports following up with OB/GYN who states it was normal uterine shedding. Pt states bleeding has not decreased. States she feels lightheaded at times. Reports she has to change tampon once and hour.

## 2014-12-13 NOTE — Telephone Encounter (Signed)
Pt c/o abnormal vaginal bleeding, changing 1 pad every hour, dizziness. Pt informed to go to ED for evaluation. Pt states has a family member who can drive her to the ED, encouraged not to drive herself due to c/o dizziness. Pt verbalized understanding.

## 2014-12-13 NOTE — ED Notes (Signed)
Patient with no complaints at this time. Respirations even and unlabored. Skin warm/dry. Discharge instructions reviewed with patient at this time. Patient given opportunity to voice concerns/ask questions. IV removed per policy and band-aid applied to site. Patient discharged at this time and left Emergency Department with steady gait.  

## 2014-12-13 NOTE — Discharge Instructions (Signed)
Fill prescription for, and take the Megace as prescribed. Stay hydrated. Iron prescription as directed. Follow-up with your OB/GYN is symptoms persist  Dysfunctional Uterine Bleeding Dysfunctional uterine bleeding is abnormal bleeding from the uterus. Dysfunctional uterine bleeding includes:  A period that comes earlier or later than usual.  A period that is lighter, heavier, or has blood clots.  Bleeding between periods.  Skipping one or more periods.  Bleeding after sexual intercourse.  Bleeding after menopause. HOME CARE INSTRUCTIONS  Pay attention to any changes in your symptoms. Follow these instructions to help with your condition: Eating  Eat well-balanced meals. Include foods that are high in iron, such as liver, meat, shellfish, green leafy vegetables, and eggs.  If you become constipated:  Drink plenty of water.  Eat fruits and vegetables that are high in water and fiber, such as spinach, carrots, raspberries, apples, and mango. Medicines  Take over-the-counter and prescription medicines only as told by your health care provider.  Do not change medicines without talking with your health care provider.  Aspirin or medicines that contain aspirin may make the bleeding worse. Do not take those medicines:  During the week before your period.  During your period.  If you were prescribed iron pills, take them as told by your health care provider. Iron pills help to replace iron that your body loses because of this condition. Activity  If you need to change your sanitary pad or tampon more than one time every 2 hours:  Lie in bed with your feet raised (elevated).  Place a cold pack on your lower abdomen.  Rest as much as possible until the bleeding stops or slows down.  Do not try to lose weight until the bleeding has stopped and your blood iron level is back to normal. Other Instructions  For two months, write down:  When your period starts.  When your  period ends.  When any abnormal bleeding occurs.  What problems you notice.  Keep all follow up visits as told by your health care provider. This is important. SEEK MEDICAL CARE IF:  You get light-headed or weak.  You have nausea and vomiting.  You cannot eat or drink without vomiting.  You feel dizzy or have diarrhea while you are taking medicines.  You are taking birth control pills or hormones, and you want to change them or stop taking them. SEEK IMMEDIATE MEDICAL CARE IF:  You develop a fever or chills.  You need to change your sanitary pad or tampon more than one time per hour.  Your bleeding becomes heavier, or your flow contains clots more often.  You develop pain in your abdomen.  You lose consciousness.  You develop a rash.   This information is not intended to replace advice given to you by your health care provider. Make sure you discuss any questions you have with your health care provider.   Document Released: 12/29/1999 Document Revised: 09/21/2014 Document Reviewed: 03/28/2014 Elsevier Interactive Patient Education [employer]2016 [employer] Inc[employer]

## 2015-01-04 ENCOUNTER — Ambulatory Visit: Payer: BLUE CROSS/BLUE SHIELD | Admitting: Nurse Practitioner

## 2015-01-12 ENCOUNTER — Telehealth: Payer: Self-pay | Admitting: Family Medicine

## 2015-01-12 MED ORDER — HYDROCODONE-ACETAMINOPHEN 5-325 MG PO TABS: 1.0000 | ORAL_TABLET | Freq: Three times a day (TID) | ORAL | Status: DC | PRN

## 2015-01-12 NOTE — Telephone Encounter (Signed)
On her last visit she was given 3 prescriptions for her pain medicine which was supposed to last for 3 months any particular reason why prescription necessary now versus when she has her appointment on January 13 ?

## 2015-01-12 NOTE — Telephone Encounter (Signed)
She may have a prescription for 40 tablets one 3 times a day when necessary to last her until she sees [place]. When she sees E[place]yn she will need to discuss adjustment to her monthly dosing

## 2015-01-12 NOTE — Telephone Encounter (Signed)
Discussed with pt. Script ready for pickup.  

## 2015-01-12 NOTE — Telephone Encounter (Signed)
HYDROcodone-acetaminophen (NORCO/VICODIN) 5-325 MG tablet  Patient states she needs this medicine refilled please

## 2015-01-12 NOTE — Telephone Encounter (Signed)
Seen 10/10 and given 3 scripts of #48. Pt states she takes every 4 -6 hours as needed for pain. Completley out. States she last fill 12/7 but has been taking more lately. Takes for fibromyalgia pain. On bad days takes about 4 tab. On most other days takes one to two tablets. Has been in more pain because she has been working more hours due to the holidays and the cold weather.

## 2015-01-20 ENCOUNTER — Other Ambulatory Visit: Payer: Self-pay | Admitting: Nurse Practitioner

## 2015-01-25 ENCOUNTER — Ambulatory Visit: Payer: BLUE CROSS/BLUE SHIELD | Admitting: Nurse Practitioner

## 2015-01-27 ENCOUNTER — Telehealth: Payer: Self-pay | Admitting: Family Medicine

## 2015-01-27 ENCOUNTER — Ambulatory Visit: Payer: BLUE CROSS/BLUE SHIELD | Admitting: Nurse Practitioner

## 2015-01-27 MED ORDER — HYDROCODONE-ACETAMINOPHEN 5-325 MG PO TABS: 1.0000 | ORAL_TABLET | Freq: Three times a day (TID) | ORAL | Status: DC | PRN

## 2015-01-27 MED ORDER — BACLOFEN 10 MG PO TABS: ORAL_TABLET | ORAL | Status: DC

## 2015-01-27 MED ORDER — CHLORZOXAZONE 500 MG PO TABS: ORAL_TABLET | ORAL | Status: DC

## 2015-01-27 NOTE — Telephone Encounter (Signed)
Called patient and informed her per Dr.Scott Luking- If chest tightness worse or becomes SOB go to er, try to get bp checked if elevated the n need ov sooner, d/c baclofen, use Parafon forte-one q8 prn spasm caution drow use sparingly, #15, also some sx could be stress but hard to tell over the phone. Patient verbalized understanding.

## 2015-01-27 NOTE — Telephone Encounter (Signed)
Pt called stating that she is out of her pain meds and her muscle relaxers, pt states that she missed her appt due to the power going off. Pt rescheduled for mon 1/23. Please advise.

## 2015-01-27 NOTE — Telephone Encounter (Signed)
[facility] 01/27/15 

## 2015-01-27 NOTE — Telephone Encounter (Signed)
Called and spoke with patient and informed her per Dr.Scott Luking-May have refill on pain meds,may give 15 of Baclofen,let pt know that muscle relaxers need to be used rarely.keep ov on 23rd. Patient verbalized understanding and stated that her insurance does not cover baclofen so she wants to know what else is recommended. Also patient stated that her blood pressure has been elevated and she has had tightness in her chest with headache. She has not actually taken her blood pressure but feels it is elevated, which has caused her to have anxiety attacks. Please advise?

## 2015-01-27 NOTE — Telephone Encounter (Signed)
May have refill on pain meds,may give 15 of Baclofen,let pt know that muscle relaxers need to be used rarely.keep ov on 23rd

## 2015-01-27 NOTE — Telephone Encounter (Signed)
If chest tightness worse or becomes SOB go to er, try to get bp checked if elevated the n need ov sooner, d/c baclofen, use Parafon forte-one q8 prn spasm caution drow use sparingly, #15, also some sx could be stress but hard to tell over the phone

## 2015-01-31 ENCOUNTER — Ambulatory Visit: Payer: BLUE CROSS/BLUE SHIELD | Admitting: Family Medicine

## 2015-02-06 ENCOUNTER — Ambulatory Visit: Payer: BLUE CROSS/BLUE SHIELD | Admitting: Nurse Practitioner

## 2015-02-08 ENCOUNTER — Other Ambulatory Visit (INDEPENDENT_AMBULATORY_CARE_PROVIDER_SITE_OTHER): Payer: BLUE CROSS/BLUE SHIELD | Admitting: *Deleted

## 2015-02-08 DIAGNOSIS — N912 Amenorrhea, unspecified: Secondary | ICD-10-CM

## 2015-02-08 LAB — POCT URINE PREGNANCY: PREG TEST UR: NEGATIVE

## 2015-02-24 ENCOUNTER — Ambulatory Visit (INDEPENDENT_AMBULATORY_CARE_PROVIDER_SITE_OTHER): Payer: BLUE CROSS/BLUE SHIELD | Admitting: Nurse Practitioner

## 2015-02-24 ENCOUNTER — Encounter: Payer: Self-pay | Admitting: Nurse Practitioner

## 2015-02-24 VITALS — BP 132/84 | Ht 62.5 in | Wt 250.0 lb

## 2015-02-24 DIAGNOSIS — R5383 Other fatigue: Secondary | ICD-10-CM | POA: Diagnosis not present

## 2015-02-24 DIAGNOSIS — R739 Hyperglycemia, unspecified: Secondary | ICD-10-CM | POA: Diagnosis not present

## 2015-02-24 DIAGNOSIS — Z13220 Encounter for screening for lipoid disorders: Secondary | ICD-10-CM | POA: Diagnosis not present

## 2015-02-24 DIAGNOSIS — Z1151 Encounter for screening for human papillomavirus (HPV): Secondary | ICD-10-CM

## 2015-02-24 DIAGNOSIS — Z0000 Encounter for general adult medical examination without abnormal findings: Secondary | ICD-10-CM | POA: Diagnosis not present

## 2015-02-24 DIAGNOSIS — Z124 Encounter for screening for malignant neoplasm of cervix: Secondary | ICD-10-CM

## 2015-02-24 MED ORDER — HYDROCODONE-ACETAMINOPHEN 5-325 MG PO TABS: 1.0000 | ORAL_TABLET | Freq: Three times a day (TID) | ORAL | Status: DC | PRN

## 2015-02-24 MED ORDER — CYCLOBENZAPRINE HCL 10 MG PO TABS: 10.0000 mg | ORAL_TABLET | Freq: Three times a day (TID) | ORAL | Status: DC | PRN

## 2015-02-27 ENCOUNTER — Encounter: Payer: Self-pay | Admitting: Nurse Practitioner

## 2015-02-27 NOTE — Progress Notes (Signed)
   Subjective:    Patient ID: [name] [name], female    DOB: [birth year], 25 y.o.   MRN: [id number]  HPI presents for her wellness exam. Last cycle about 2 weeks ago; normal flow. Same partner since August. Defers birth control. Regular dental care. Regular walking as activity. Continues to work on weight loss.     Review of Systems  Constitutional: Positive for fatigue. Negative for fever, activity change and appetite change.  HENT: Negative for dental problem, ear pain, sinus pressure and sore throat.   Eyes: Negative for visual disturbance.  Respiratory: Negative for cough, chest tightness, shortness of breath and wheezing.   Cardiovascular: Negative for chest pain.  Gastrointestinal: Negative for nausea, vomiting, abdominal pain, diarrhea, constipation and abdominal distention.  Genitourinary: Negative for dysuria, urgency, frequency, vaginal discharge, enuresis, difficulty urinating, genital sores, menstrual problem and pelvic pain.       Objective:   Physical Exam  Constitutional: She is oriented to person, place, and time. She appears well-developed. No distress.  HENT:  Right Ear: External ear normal.  Left Ear: External ear normal.  Mouth/Throat: Oropharynx is clear and moist.  Neck: Normal range of motion. Neck supple. No tracheal deviation present. No thyromegaly present.  Cardiovascular: Normal rate, regular rhythm and normal heart sounds.  Exam reveals no gallop.   No murmur heard. Pulmonary/Chest: Effort normal and breath sounds normal.  Abdominal: Soft. She exhibits no distension. There is no tenderness.  Genitourinary: Vagina normal and uterus normal. No vaginal discharge found.  External GU: no rashes or lesions. Vagina: no discharge. No CMT. Bimanual exam: no tenderness or obvious masses; very limited due to abd girth.   Musculoskeletal: She exhibits no edema.  Lymphadenopathy:    She has no cervical adenopathy.  Neurological: She is alert and oriented to person,  place, and time.  Skin: Skin is warm and dry. No rash noted.  Psychiatric: She has a normal mood and affect. Her behavior is normal.  Vitals reviewed.  Breast exam: Mostly dense tissue, minimal fine nodularity, no masses. Axilla no adenopathy.        Assessment & Plan:  Routine general medical examination at a health care facility - Plan: Pap IG and HPV (high risk) DNA detection, Lipid panel, Hepatic function panel, Basic metabolic panel, TSH, VITAMIN D 25 Hydroxy (Vit-D Deficiency, Fractures), Hemoglobin A1c, CBC with Differential/Platelet, Insulin, Fasting  Screening for cervical cancer - Plan: Pap IG and HPV (high risk) DNA detection  Screening for HPV (human papillomavirus) - Plan: Pap IG and HPV (high risk) DNA detection  Hyperglycemia - Plan: Hemoglobin A1c, Insulin, Fasting  Other fatigue - Plan: Basic metabolic panel, TSH, VITAMIN D 25 Hydroxy (Vit-D Deficiency, Fractures), CBC with Differential/Platelet  Screening for lipid disorders - Plan: Lipid panel  Encourage regular activity healthy diet and continued weight loss. Lab work pending.

## 2015-03-03 LAB — PAP IG AND HPV HIGH-RISK
HPV, high-risk: NEGATIVE
PAP Smear Comment: 0

## 2015-03-13 ENCOUNTER — Telehealth: Payer: Self-pay | Admitting: Family Medicine

## 2015-03-13 ENCOUNTER — Ambulatory Visit (INDEPENDENT_AMBULATORY_CARE_PROVIDER_SITE_OTHER): Payer: BLUE CROSS/BLUE SHIELD | Admitting: Family Medicine

## 2015-03-13 ENCOUNTER — Encounter: Payer: Self-pay | Admitting: Family Medicine

## 2015-03-13 VITALS — BP 122/80 | Temp 98.8°F | Ht 62.0 in | Wt 255.0 lb

## 2015-03-13 DIAGNOSIS — J019 Acute sinusitis, unspecified: Secondary | ICD-10-CM

## 2015-03-13 DIAGNOSIS — B9689 Other specified bacterial agents as the cause of diseases classified elsewhere: Secondary | ICD-10-CM

## 2015-03-13 DIAGNOSIS — B349 Viral infection, unspecified: Secondary | ICD-10-CM

## 2015-03-13 MED ORDER — CEFPROZIL 500 MG PO TABS: 500.0000 mg | ORAL_TABLET | Freq: Two times a day (BID) | ORAL | Status: DC

## 2015-03-13 NOTE — Telephone Encounter (Signed)
It is possible this could be intestinal virus. It is less likely is the medication. She has 2 choices: zofran 8 mg ODT, 1 3 times a day when necessary nausea, #12. OPTION 2- stop cefzil and try different antibiotic. Pt may also need work note

## 2015-03-13 NOTE — Telephone Encounter (Signed)
Patient was seen today and diagnosed with rhinosinusitis.  She said she went home and took her medicine, ate and fell asleep. When she woke up, she has done nothing but run back and forth to the bathroom vomiting.  Please advise.

## 2015-03-13 NOTE — Progress Notes (Signed)
   Subjective:    Patient ID: [name] [name], female    DOB: 1990-09-27, 25 y.o.   MRN: [id number]  Sinusitis This is a new problem. Episode onset: 2 weeks ago. Associated symptoms include congestion, coughing, ear pain and headaches. Pertinent negatives include no shortness of breath. (Runny nose) Treatments tried: mucinex, nyquil, sinus med. The treatment provided no relief.    significant head congestion drainage coughing over the past couple weeks worse over the past 7 days with sinus pressure pain discomfort denies high fever chills   Review of Systems  Constitutional: Negative for fever and activity change.  HENT: Positive for congestion, ear pain and rhinorrhea.   Eyes: Negative for discharge.  Respiratory: Positive for cough. Negative for shortness of breath and wheezing.   Cardiovascular: Negative for chest pain.  Neurological: Positive for headaches.       Objective:   Physical Exam  Constitutional: She appears well-developed.  HENT:  Head: Normocephalic.  Nose: Nose normal.  Mouth/Throat: Oropharynx is clear and moist. No oropharyngeal exudate.  Neck: Neck supple.  Cardiovascular: Normal rate and normal heart sounds.   No murmur heard. Pulmonary/Chest: Effort normal and breath sounds normal. She has no wheezes.  Lymphadenopathy:    She has no cervical adenopathy.  Skin: Skin is warm and dry.  Nursing note and vitals reviewed.    she states she is Back on smoking she was encouraged to quit      Assessment & Plan:  Patient was seen today for upper respiratory illness. It is felt that the patient is dealing with sinusitis. Antibiotics were prescribed today. Importance of compliance with medication was discussed. Symptoms should gradually resolve over the course of the next several days. If high fevers, progressive illness, difficulty breathing, worsening condition or failure for symptoms to improve over the next several days then the patient is to follow-up. If any  emergent conditions the patient is to follow-up in the emergency department otherwise to follow-up in the office.  I find no evidence of pneumonia for the patient she should do well on antibiotics if having further trouble she will follow-up

## 2015-03-14 MED ORDER — ONDANSETRON 8 MG PO TBDP: 8.0000 mg | ORAL_TABLET | Freq: Three times a day (TID) | ORAL | Status: DC | PRN

## 2015-03-14 NOTE — Telephone Encounter (Signed)
Notified patient it is possible this could be intestinal virus. It is less likely is the medication. She has 2 choices: zofran 8 mg ODT, 1 3 times a day when necessary nausea, #12. OPTION 2- stop cefzil and try different antibiotic. Patient wants the zofran sent in. Med sent to pharmacy. Please give patient a work note for today.

## 2015-03-17 ENCOUNTER — Encounter: Payer: Self-pay | Admitting: Family Medicine

## 2015-04-06 ENCOUNTER — Other Ambulatory Visit: Payer: Self-pay | Admitting: Nurse Practitioner

## 2015-04-07 ENCOUNTER — Telehealth: Payer: Self-pay | Admitting: *Deleted

## 2015-04-07 ENCOUNTER — Other Ambulatory Visit: Payer: Self-pay | Admitting: Nurse Practitioner

## 2015-04-07 ENCOUNTER — Ambulatory Visit (HOSPITAL_COMMUNITY)
Admission: RE | Admit: 2015-04-07 | Discharge: 2015-04-07 | Disposition: A | Discharge status: Home / Self Care | Payer: BLUE CROSS/BLUE SHIELD | Source: Ambulatory Visit | Attending: Nurse Practitioner | Admitting: Nurse Practitioner

## 2015-04-07 ENCOUNTER — Encounter: Payer: Self-pay | Admitting: Nurse Practitioner

## 2015-04-07 ENCOUNTER — Ambulatory Visit (INDEPENDENT_AMBULATORY_CARE_PROVIDER_SITE_OTHER): Payer: BLUE CROSS/BLUE SHIELD | Admitting: Nurse Practitioner

## 2015-04-07 VITALS — BP 124/80 | Temp 97.6°F | Ht 62.5 in | Wt 253.0 lb

## 2015-04-07 DIAGNOSIS — N200 Calculus of kidney: Secondary | ICD-10-CM | POA: Diagnosis not present

## 2015-04-07 DIAGNOSIS — Z3200 Encounter for pregnancy test, result unknown: Secondary | ICD-10-CM | POA: Diagnosis not present

## 2015-04-07 DIAGNOSIS — N838 Other noninflammatory disorders of ovary, fallopian tube and broad ligament: Secondary | ICD-10-CM | POA: Diagnosis not present

## 2015-04-07 DIAGNOSIS — Z9049 Acquired absence of other specified parts of digestive tract: Secondary | ICD-10-CM | POA: Diagnosis not present

## 2015-04-07 DIAGNOSIS — R350 Frequency of micturition: Secondary | ICD-10-CM | POA: Insufficient documentation

## 2015-04-07 DIAGNOSIS — R319 Hematuria, unspecified: Secondary | ICD-10-CM | POA: Diagnosis not present

## 2015-04-07 LAB — POCT URINALYSIS DIPSTICK
PH UA: 7
SPEC GRAV UA: 1.015

## 2015-04-07 LAB — POCT URINE PREGNANCY: PREG TEST UR: NEGATIVE

## 2015-04-07 MED ORDER — CEFDINIR 300 MG PO CAPS: 300.0000 mg | ORAL_CAPSULE | Freq: Two times a day (BID) | ORAL | Status: DC

## 2015-04-07 MED ORDER — HYDROCODONE-ACETAMINOPHEN 5-325 MG PO TABS: 1.0000 | ORAL_TABLET | Freq: Three times a day (TID) | ORAL | Status: DC | PRN

## 2015-04-07 NOTE — Telephone Encounter (Signed)
[name] [name] would like a refill of the following medications:    HYDROcodone-acetaminophen (NORCO/VICODIN) 5-325 MG tablet [[name], Presley Raddlearolyn C, NP]

## 2015-04-07 NOTE — Addendum Note (Signed)
Addended by: Campbell RichesHOSKINS, Adelie Croswell C on: 04/07/2015 11:49 AM   Modules accepted: Orders

## 2015-04-07 NOTE — Telephone Encounter (Signed)
Already done during visit today.

## 2015-04-08 ENCOUNTER — Encounter: Payer: Self-pay | Admitting: Nurse Practitioner

## 2015-04-08 LAB — POCT UA - MICROSCOPIC ONLY
Bacteria, U Microscopic: NEGATIVE
Epithelial cells, urine per micros: NEGATIVE
RBC, URINE, MICROSCOPIC: NEGATIVE

## 2015-04-08 NOTE — Progress Notes (Signed)
Subjective:  Presents for complaints of sharp pains occurring in the right mid back/flank area for the past 4-5 days. Nausea with occasional vomiting. No fever. No dysuria but has had urgency and frequency. No obvious hematuria. Minimal pelvic pain. Same sexual partner. No vaginal discharge. Last normal menstrual cycle was 2/20. Has been slightly irregular. Has had kidney stones in the past see previous notes. None recently.  Objective:   BP 124/80 mmHg  Temp(Src) 97.6 F (36.4 C) (Oral)  Ht 5' 2.5" (1.588 m)  Wt 253 lb (114.76 kg)  BMI 45.51 kg/m2  LMP 03/10/2015 NAD. Alert, oriented. Lungs clear. Heart regular rate rhythm. Positive right CVA tenderness radiating into the right flank area towards the right mid abdomen. Minimal lower abdominal tenderness. Results for orders placed or performed in visit on 04/07/15  POCT urine pregnancy  Result Value Ref Range   Preg Test, Ur Negative Negative  POCT urinalysis dipstick  Result Value Ref Range   Color, UA Yellow    Clarity, UA Clear    Glucose, UA     Bilirubin, UA     Ketones, UA     Spec Grav, UA 1.015    Blood, UA Moderate    pH, UA 7.0    Protein, UA     Urobilinogen, UA     Nitrite, UA     Leukocytes, UA large (3+) (A) Negative  POCT UA - Microscopic Only  Result Value Ref Range   WBC, Ur, HPF, POC rare    RBC, urine, microscopic neg    Bacteria, U Microscopic neg    Mucus, UA     Epithelial cells, urine per micros neg    Crystals, Ur, HPF, POC     Casts, Ur, LPF, POC     Yeast, UA       Assessment: Urinary frequency - Plan: POCT urinalysis dipstick, Urine Culture, CT RENAL STONE STUDY  Encounter for pregnancy test - Plan: POCT urine pregnancy  Kidney stone - Plan: CT RENAL STONE STUDY  Hematuria  Plan: A stat CT scan of the abdomen and pelvis to evaluate for renal stones was ordered. The hospital ask patient to come over immediately. As soon as patient left the office our referral coordinator began working on  prior 3 cessation as quickly as possible. Within a short period of time, a peer to peer review was done with nurse practitioner. The physician for the insurance company refused to give PA for CT scan only ultrasound. We attempted to contact radiology immediately to cancel CT scan but no answer in the department. When we pulled patient's chart up on epic, the CT scan had a party been performed. See results.

## 2015-04-09 LAB — URINE CULTURE

## 2015-06-13 ENCOUNTER — Encounter: Payer: Self-pay | Admitting: Nurse Practitioner

## 2015-06-14 ENCOUNTER — Other Ambulatory Visit: Payer: Self-pay | Admitting: Nurse Practitioner

## 2015-06-14 ENCOUNTER — Telehealth: Payer: Self-pay | Admitting: *Deleted

## 2015-06-14 NOTE — Telephone Encounter (Signed)
Pt wants refill on hydrocodone.  

## 2015-06-15 ENCOUNTER — Other Ambulatory Visit: Payer: Self-pay | Admitting: Nurse Practitioner

## 2015-06-15 MED ORDER — BACLOFEN 10 MG PO TABS: 10.0000 mg | ORAL_TABLET | Freq: Three times a day (TID) | ORAL | Status: DC

## 2015-06-15 MED ORDER — HYDROCODONE-ACETAMINOPHEN 5-325 MG PO TABS: 1.0000 | ORAL_TABLET | Freq: Three times a day (TID) | ORAL | Status: DC | PRN

## 2015-06-15 NOTE — Telephone Encounter (Signed)
One refill written to leave up front. Please verify that she is not pregnant. Thanks.

## 2015-06-15 NOTE — Telephone Encounter (Signed)
Spoke with patient and informed her per [name] Canaryarolyn [name],NP- refill on muscle relaxer was sent into pharmacy. Patient verbalized understanding.

## 2015-06-15 NOTE — Telephone Encounter (Signed)
Spoke with patient and verified that she is not pregnant. Informed patient per Nathaneil Canaryarolyn [name],NP- that prescription for hydrocodone is ready for pick up. Patient verbalized understanding and asked if she could also get a muscle relaxer. Please advise

## 2015-06-15 NOTE — Telephone Encounter (Signed)
Sent in muscle relaxer refill 

## 2015-08-01 NOTE — Pre-Procedure Instructions (Signed)
Shelisha Flonnie Overman  08/01/2015      [facility] - [place], [place] - [address] [place] [address] Phone: [phone] Fax: [phone]  [facility] - [place], [place] - [address] [place] [address] Phone: [phone] Fax: [phone]  [id number] - [place], [place] - [address] [place] [address] Phone: [phone] Fax: [phone]  [id number] - [place], [place] - [address] AT [facility] [address] [place] [address] Phone: [phone] Fax: [phone]    Your procedure is scheduled on Friday, July 21st, 2017.  Report to [facility] Admitting at 6:30 A.M.   Call this number if you have problems the morning of surgery:  [phone]   Remember:  Do not eat food or drink liquids after midnight.   Take these medicines the morning of surgery with A SIP OF WATER: None.  Stop taking: Ibuprofen, Advil, Motrin, Aspirin, NSAIDS, Aleve, Naproxen, BC's, Goody's, Fish oil, all herbal medications, and all vitamins.    Do not wear jewelry, make-up or nail polish.  Do not wear lotions, powders, or perfumes.    Do not shave 48 hours prior to surgery.    Do not bring valuables to the hospital.  [facility] is not responsible for any belongings or valuables.  Contacts, dentures or bridgework may not be worn into surgery.  Leave your suitcase in the car.  After surgery it may be brought to your room.  For patients admitted to the hospital, discharge time will be determined by your treatment team.  Patients discharged the day of surgery will not be allowed to drive home.   Special instructions:  Preparing for Surgery.   Please read over the following fact sheets that you were given.   - Preparing For Surgery  Before surgery, you can play an important role. Because skin is not sterile, your skin needs to be as free of germs as  possible. You can reduce the number of germs on your skin by washing with CHG (chlorahexidine gluconate) Soap before surgery.  CHG is an antiseptic cleaner which kills germs and bonds with the skin to continue killing germs even after washing.  Please do not use if you have an allergy to CHG or antibacterial soaps. If your skin becomes reddened/irritated stop using the CHG.  Do not shave (including legs and underarms) for at least 48 hours prior to first CHG shower. It is OK to shave your face.  Please follow these instructions carefully.   1. Shower the NIGHT BEFORE SURGERY and the MORNING OF SURGERY with CHG.   2. If you chose to wash your hair, wash your hair first as usual with your normal shampoo.  3. After you shampoo, rinse your hair and body thoroughly to remove the shampoo.  4. Use CHG as you would any other liquid soap. You can apply CHG directly to the skin and wash gently with a scrungie or a clean washcloth.   5. Apply the CHG Soap to your body ONLY FROM THE NECK DOWN.  Do not use on open wounds or open sores. Avoid contact with your eyes, ears, mouth and genitals (private parts). Wash genitals (private parts) with your normal soap.  6. Wash thoroughly, paying special attention to the area where your surgery will be performed.  7. Thoroughly rinse your body with warm water from the neck down.  8. DO NOT shower/wash with your normal soap after using and rinsing off the CHG Soap.  9. Pat yourself dry with a CLEAN TOWEL.   10. Wear CLEAN PAJAMAS   11. Place CLEAN SHEETS on your bed the night of your first shower and DO NOT SLEEP WITH PETS.  Day of Surgery: Do not apply any deodorants/lotions. Please wear clean clothes to the hospital/surgery center.

## 2015-08-02 ENCOUNTER — Encounter (HOSPITAL_COMMUNITY): Payer: Self-pay

## 2015-08-02 ENCOUNTER — Encounter (HOSPITAL_COMMUNITY)
Admission: RE | Admit: 2015-08-02 | Discharge: 2015-08-02 | Disposition: A | Discharge status: Home / Self Care | Payer: Medicaid Other | Source: Ambulatory Visit | Attending: Oral Surgery | Admitting: Oral Surgery

## 2015-08-02 DIAGNOSIS — Z01812 Encounter for preprocedural laboratory examination: Secondary | ICD-10-CM | POA: Diagnosis present

## 2015-08-02 HISTORY — DX: Paresthesia of skin: R20.2

## 2015-08-02 HISTORY — DX: Other specified postprocedural states: Z98.890

## 2015-08-02 HISTORY — DX: Anesthesia of skin: R20.0

## 2015-08-02 HISTORY — DX: Nausea with vomiting, unspecified: R11.2

## 2015-08-02 LAB — BASIC METABOLIC PANEL
ANION GAP: 5 (ref 5–15)
BUN: 12 mg/dL (ref 6–20)
CO2: 25 mmol/L (ref 22–32)
Calcium: 9.2 mg/dL (ref 8.9–10.3)
Chloride: 106 mmol/L (ref 101–111)
Creatinine, Ser: 0.77 mg/dL (ref 0.44–1.00)
Glucose, Bld: 101 mg/dL — ABNORMAL HIGH (ref 65–99)
POTASSIUM: 4.3 mmol/L (ref 3.5–5.1)
SODIUM: 136 mmol/L (ref 135–145)

## 2015-08-02 LAB — CBC
HEMATOCRIT: 46.1 % — AB (ref 36.0–46.0)
HEMOGLOBIN: 15.7 g/dL — AB (ref 12.0–15.0)
MCH: 29.3 pg (ref 26.0–34.0)
MCHC: 34.1 g/dL (ref 30.0–36.0)
MCV: 86.2 fL (ref 78.0–100.0)
Platelets: 283 10*3/uL (ref 150–400)
RBC: 5.35 MIL/uL — AB (ref 3.87–5.11)
RDW: 12.5 % (ref 11.5–15.5)
WBC: 9.1 10*3/uL (ref 4.0–10.5)

## 2015-08-02 LAB — HCG, SERUM, QUALITATIVE: PREG SERUM: NEGATIVE

## 2015-08-02 NOTE — H&P (Signed)
HISTORY AND PHYSICAL  Aalia Flonnie OvermanL Bray is a 25 y.o. female patient referred by general dentist for multiple extractions  No diagnosis found.  Past Medical History  Diagnosis Date  . Fibromyalgia   . Asthma   . Syringomyelia (HCC)     thoracic spine  . GERD (gastroesophageal reflux disease)   . Chronic pain     [place]  . Chronic back pain   . DDD (degenerative disc disease)   . Scoliosis   . Bulging disc   . Spinal bifida, closed   . Depression   . Renal disorder     kidney stones  . Kidney stone   . Complication of anesthesia     larynaspasm after extubation following Cholecystectomy  . Popping of temporomandibular joint on opening of jaw   . Panic attack     No current facility-administered medications for this encounter.   Current Outpatient Prescriptions  Medication Sig Dispense Refill  . ibuprofen (ADVIL,MOTRIN) 800 MG tablet Take 800 mg by mouth every 8 (eight) hours as needed for moderate pain.    . baclofen (LIORESAL) 10 MG tablet Take 1 tablet (10 mg total) by mouth 3 (three) times daily. (Patient not taking: Reported on 07/31/2015) 30 each 2  . cefdinir (OMNICEF) 300 MG capsule Take 1 capsule (300 mg total) by mouth 2 (two) times daily. (Patient not taking: Reported on 07/31/2015) 14 capsule 0  . HYDROcodone-acetaminophen (NORCO/VICODIN) 5-325 MG tablet Take 1 tablet by mouth 3 (three) times daily as needed. Caution drowsiness (Patient not taking: Reported on 07/31/2015) 40 tablet 0  . ondansetron (ZOFRAN ODT) 8 MG disintegrating tablet Take 1 tablet (8 mg total) by mouth 3 (three) times daily as needed for nausea or vomiting. (Patient not taking: Reported on 04/07/2015) 12 tablet 0  . Prenatal Vit-Fe Fumarate-FA (PNV PRENATAL PLUS MULTIVITAMIN) 27-1 MG TABS Take 1 tablet by mouth daily. (Patient not taking: Reported on 07/31/2015) 30 tablet 11   Allergies  Allergen Reactions  . Sulfamethoxazole Anaphylaxis  . Penicillins Other (See Comments)    Stomach cramps-  can tolerate cephalosporins  . Sulfa Antibiotics Other (See Comments)    Childhood Allergy  . Sulfonamide Derivatives Other (See Comments)    Reaction occurred during childhood    Active Problems:   * No active hospital problems. *  Vitals: There were no vitals taken for this visit. Lab results:No results found for this or any previous visit (from the past 24 hour(s)). Radiology Results: No results found. Panorex radiograph  Demonstrates possible cyst left mandible General appearance: alert, cooperative and morbidly obese Head: Normocephalic, without obvious abnormality, atraumatic Eyes: negative Nose: Nares normal. Septum midline. Mucosa normal. No drainage or sinus tenderness. Throat: Impacted teeth 16, 17, 32, gross caries teeth  # 15, 18, 31. Pharynx clear. Neck: no adenopathy, supple, symmetrical, trachea midline and thyroid not enlarged, symmetric, no tenderness/mass/nodules Resp: clear to auscultation bilaterally Cardio: regular rate and rhythm, S1, S2 normal, no murmur, click, rub or gallop  Assessment: Non-restorable and impacted teeth 15, 16, 17, 18, 31, 32, possible cyst left mandible  Plan: extraction teeth 15, 16, 17, 18, 31, 32, removal cyst left mandible. GA. Day surgery.   Georgia LopesJENSEN,Erza Mothershead M 08/02/2015

## 2015-08-02 NOTE — Progress Notes (Signed)
PCP  - Dr. Lilyan PuntScott Luking Cardiologist - denies  EKG/CXR - denies Echo/stress test/cardiac cath - denies  Patient denies chest pain and shortness of breath at PAT appointment.

## 2015-08-04 ENCOUNTER — Ambulatory Visit (HOSPITAL_COMMUNITY)
Admission: RE | Admit: 2015-08-04 | Discharge: 2015-08-04 | Disposition: A | Discharge status: Home / Self Care | Payer: Medicaid Other | Source: Ambulatory Visit | Attending: Oral Surgery | Admitting: Oral Surgery

## 2015-08-04 ENCOUNTER — Ambulatory Visit (HOSPITAL_COMMUNITY): Payer: Medicaid Other | Admitting: Anesthesiology

## 2015-08-04 ENCOUNTER — Encounter (HOSPITAL_COMMUNITY)
Admission: RE | Disposition: A | Discharge status: Home / Self Care | Payer: Self-pay | Source: Ambulatory Visit | Attending: Oral Surgery

## 2015-08-04 ENCOUNTER — Encounter (HOSPITAL_COMMUNITY): Payer: Self-pay | Admitting: *Deleted

## 2015-08-04 DIAGNOSIS — F419 Anxiety disorder, unspecified: Secondary | ICD-10-CM | POA: Diagnosis not present

## 2015-08-04 DIAGNOSIS — K011 Impacted teeth: Secondary | ICD-10-CM | POA: Insufficient documentation

## 2015-08-04 DIAGNOSIS — K219 Gastro-esophageal reflux disease without esophagitis: Secondary | ICD-10-CM | POA: Insufficient documentation

## 2015-08-04 DIAGNOSIS — M1990 Unspecified osteoarthritis, unspecified site: Secondary | ICD-10-CM | POA: Insufficient documentation

## 2015-08-04 DIAGNOSIS — J45909 Unspecified asthma, uncomplicated: Secondary | ICD-10-CM | POA: Insufficient documentation

## 2015-08-04 DIAGNOSIS — F329 Major depressive disorder, single episode, unspecified: Secondary | ICD-10-CM | POA: Diagnosis not present

## 2015-08-04 DIAGNOSIS — M797 Fibromyalgia: Secondary | ICD-10-CM | POA: Insufficient documentation

## 2015-08-04 HISTORY — PX: MULTIPLE EXTRACTIONS WITH ALVEOLOPLASTY: SHX5342

## 2015-08-04 SURGERY — MULTIPLE EXTRACTION WITH ALVEOLOPLASTY
Anesthesia: General | Laterality: Bilateral

## 2015-08-04 MED ORDER — LIDOCAINE HCL (CARDIAC) 20 MG/ML IV SOLN
INTRAVENOUS | Status: DC | PRN
  Administered 2015-08-04: 100 mg via INTRAVENOUS

## 2015-08-04 MED ORDER — DEXAMETHASONE SODIUM PHOSPHATE 10 MG/ML IJ SOLN
INTRAMUSCULAR | Status: DC | PRN
  Administered 2015-08-04: 10 mg via INTRAVENOUS

## 2015-08-04 MED ORDER — ALBUTEROL SULFATE HFA 108 (90 BASE) MCG/ACT IN AERS
INHALATION_SPRAY | RESPIRATORY_TRACT | Status: DC | PRN
  Administered 2015-08-04: 4 via RESPIRATORY_TRACT

## 2015-08-04 MED ORDER — ONDANSETRON HCL 4 MG/2ML IJ SOLN
INTRAMUSCULAR | Status: DC | PRN
  Administered 2015-08-04: 4 mg via INTRAVENOUS

## 2015-08-04 MED ORDER — FENTANYL CITRATE (PF) 250 MCG/5ML IJ SOLN
INTRAMUSCULAR | Status: AC
  Filled 2015-08-04: qty 5

## 2015-08-04 MED ORDER — SUCCINYLCHOLINE CHLORIDE 20 MG/ML IJ SOLN
INTRAMUSCULAR | Status: DC | PRN
  Administered 2015-08-04: 120 mg via INTRAVENOUS

## 2015-08-04 MED ORDER — OXYMETAZOLINE HCL 0.05 % NA SOLN
NASAL | Status: AC
  Filled 2015-08-04: qty 15

## 2015-08-04 MED ORDER — FENTANYL CITRATE (PF) 100 MCG/2ML IJ SOLN
25.0000 ug | INTRAMUSCULAR | Status: DC | PRN
  Administered 2015-08-04 (×2): 50 ug via INTRAVENOUS

## 2015-08-04 MED ORDER — MIDAZOLAM HCL 2 MG/2ML IJ SOLN
INTRAMUSCULAR | Status: AC
  Filled 2015-08-04: qty 2

## 2015-08-04 MED ORDER — OXYCODONE-ACETAMINOPHEN 5-325 MG PO TABS: 1.0000 | ORAL_TABLET | ORAL | Status: DC | PRN

## 2015-08-04 MED ORDER — DEXAMETHASONE SODIUM PHOSPHATE 10 MG/ML IJ SOLN
INTRAMUSCULAR | Status: AC
  Filled 2015-08-04: qty 1

## 2015-08-04 MED ORDER — PROPOFOL 10 MG/ML IV BOLUS
INTRAVENOUS | Status: DC | PRN
  Administered 2015-08-04: 20 mg via INTRAVENOUS
  Administered 2015-08-04: 40 mg via INTRAVENOUS
  Administered 2015-08-04: 200 mg via INTRAVENOUS

## 2015-08-04 MED ORDER — PROPOFOL 10 MG/ML IV BOLUS
INTRAVENOUS | Status: AC
  Filled 2015-08-04: qty 20

## 2015-08-04 MED ORDER — SODIUM CHLORIDE 0.9 % IR SOLN
Status: DC | PRN
  Administered 2015-08-04: 1000 mL

## 2015-08-04 MED ORDER — MIDAZOLAM HCL 5 MG/5ML IJ SOLN
INTRAMUSCULAR | Status: DC | PRN
  Administered 2015-08-04: 2 mg via INTRAVENOUS

## 2015-08-04 MED ORDER — PROMETHAZINE HCL 25 MG/ML IJ SOLN: 6.2500 mg | INTRAMUSCULAR | Status: DC | PRN

## 2015-08-04 MED ORDER — 0.9 % SODIUM CHLORIDE (POUR BTL) OPTIME
TOPICAL | Status: DC | PRN
  Administered 2015-08-04: 1000 mL

## 2015-08-04 MED ORDER — FENTANYL CITRATE (PF) 100 MCG/2ML IJ SOLN
INTRAMUSCULAR | Status: DC | PRN
  Administered 2015-08-04: 50 ug via INTRAVENOUS

## 2015-08-04 MED ORDER — LACTATED RINGERS IV SOLN
INTRAVENOUS | Status: DC
  Administered 2015-08-04: 07:00:00 via INTRAVENOUS

## 2015-08-04 MED ORDER — LIDOCAINE-EPINEPHRINE 2 %-1:100000 IJ SOLN
INTRAMUSCULAR | Status: DC | PRN
  Administered 2015-08-04: 15 mL

## 2015-08-04 MED ORDER — ONDANSETRON HCL 4 MG/2ML IJ SOLN
INTRAMUSCULAR | Status: AC
  Filled 2015-08-04: qty 2

## 2015-08-04 MED ORDER — LIDOCAINE-EPINEPHRINE 1 %-1:100000 IJ SOLN
INTRAMUSCULAR | Status: AC
  Filled 2015-08-04: qty 1

## 2015-08-04 SURGICAL SUPPLY — 31 items
BUR CROSS CUT FISSURE 1.6 (BURR) ×2 IMPLANT
BUR CROSS CUT FISSURE 1.6MM (BURR) ×1
BUR EGG ELITE 4.0 (BURR) ×1 IMPLANT
BUR EGG ELITE 4.0MM (BURR) ×1
COVER SURGICAL LIGHT HANDLE (MISCELLANEOUS) ×3 IMPLANT
CRADLE DONUT ADULT HEAD (MISCELLANEOUS) ×5 IMPLANT
DECANTER SPIKE VIAL GLASS SM (MISCELLANEOUS) ×2 IMPLANT
DRAPE U-SHAPE 76X120 STRL (DRAPES) ×3 IMPLANT
FLUID NSS /IRRIG 1000 ML XXX (MISCELLANEOUS) ×3 IMPLANT
GAUZE PACKING FOLDED 2  STR (GAUZE/BANDAGES/DRESSINGS) ×2
GAUZE PACKING FOLDED 2 STR (GAUZE/BANDAGES/DRESSINGS) ×1 IMPLANT
GLOVE BIO SURGEON STRL SZ 6.5 (GLOVE) ×2 IMPLANT
GLOVE BIO SURGEON STRL SZ7.5 (GLOVE) ×3 IMPLANT
GLOVE BIO SURGEONS STRL SZ 6.5 (GLOVE) ×1
GLOVE BIOGEL PI IND STRL 7.0 (GLOVE) ×1 IMPLANT
GLOVE BIOGEL PI INDICATOR 7.0 (GLOVE) ×2
GOWN STRL REUS W/ TWL LRG LVL3 (GOWN DISPOSABLE) ×1 IMPLANT
GOWN STRL REUS W/ TWL XL LVL3 (GOWN DISPOSABLE) ×1 IMPLANT
GOWN STRL REUS W/TWL LRG LVL3 (GOWN DISPOSABLE) ×3
GOWN STRL REUS W/TWL XL LVL3 (GOWN DISPOSABLE) ×3
KIT BASIN OR (CUSTOM PROCEDURE TRAY) ×3 IMPLANT
KIT ROOM TURNOVER OR (KITS) ×3 IMPLANT
MANIFOLD NEPTUNE WASTE (CANNULA) ×2 IMPLANT
NEEDLE 22X1 1/2 (OR ONLY) (NEEDLE) ×6 IMPLANT
NS IRRIG 1000ML POUR BTL (IV SOLUTION) ×3 IMPLANT
PAD ARMBOARD 7.5X6 YLW CONV (MISCELLANEOUS) ×3 IMPLANT
SUT CHROMIC 3 0 PS 2 (SUTURE) ×6 IMPLANT
SYR CONTROL 10ML LL (SYRINGE) ×3 IMPLANT
TRAY ENT MC OR (CUSTOM PROCEDURE TRAY) ×3 IMPLANT
TUBING IRRIGATION (MISCELLANEOUS) ×3 IMPLANT
YANKAUER SUCT BULB TIP NO VENT (SUCTIONS) ×3 IMPLANT

## 2015-08-04 NOTE — Transfer of Care (Signed)
Immediate Anesthesia Transfer of Care Note  Patient: [name] JeffersonAmber L [name]  Procedure(s) Performed: Procedure(s): MULTIPLE EXTRACTION  (Bilateral)  Patient Location: PACU  Anesthesia Type:General  Level of Consciousness: awake, alert  and oriented  Airway & Oxygen Therapy: Patient Spontanous Breathing and Patient connected to face mask oxygen  Post-op Assessment: Report given to RN, Post -op Vital signs reviewed and stable and Patient moving all extremities X 4  Post vital signs: Reviewed and stable  Last Vitals:  Filed Vitals:   08/04/15 0701  BP: 141/65  Pulse: 80  Temp: 36.8 C  Resp: 20    Last Pain:  Filed Vitals:   08/04/15 0720  PainSc: 5          Complications: No apparent anesthesia complications

## 2015-08-04 NOTE — Op Note (Signed)
NAMPhillips [name]:  [name], [name] [name]                  ACCOUNT NO.:  [id number]  MEDICAL RECORD NO.:  [id number]  LOCATION:  [facility]                         FACILITY:  [facility]  PHYSICIAN:  Georgia LopesScott M. Deasiah Hagberg, M.D.  DATE OF BIRTH:  [birth year]  DATE OF PROCEDURE:  08/04/2015 DATE OF DISCHARGE:                              OPERATIVE REPORT   PREOPERATIVE DIAGNOSES: 1. Impacted teeth #16, 17, and 32. 2. Nonrestorable teeth #18, 31. 3. Possible cyst, left mandible.  POSTOPERATIVE DIAGNOSES: 1. Impacted teeth #16, 17, and 32. 2. Nonrestorable teeth #18, 31. 3. No cyst, left mandible.  PROCEDURE:  Extraction of teeth #1, 16, 17, 18, 31, and 32.  SURGEON:  Georgia LopesScott M. Rene Gonsoulin, MD.  ANESTHESIA:  General.  DESCRIPTION OF PROCEDURE:  The patient was taken to the operating room and placed on the table in supine position.  General anesthesia was administered.  Endotracheal tube was placed and time-out was performed. The patient was draped for the procedure.  The posterior pharynx was suctioned.  A throat pack was placed.  A 2% lidocaine with 1:100,000 epinephrine was infiltrated in an inferior alveolar block on the right and left side and palatal infiltration of the left maxilla around tooth #16.  A bite block was placed in the right side of the mouth and a sweetheart retractor was used to retract the tongue.  A #15 blade was used to make an incision on below fashion overlying tooth #17 and carried forward around tooth #18 on the buccal aspect.  The periosteum was reflected with a periosteal elevator.  Bone was removed overlying tooth #17 with a Stryker handpiece.  Tooth was elevated and removed with a 301 elevator.  A buccal root was noted to be fractured and portions of it remained in the bone.  The root was removed using the Stryker handpiece and root tip pick.  Then, bone was removed around tooth #18 and tooth was elevated with a 301 elevator and removed from the mouth with a #23 dental forceps.  The socket was  then curetted, irrigated, and closed with 3-0 chromic.  No cyst was noted in the left mandible and therefore attention was turned to the left maxilla.  A 15 blade was used to make an umbilical incision overlying tooth #16.  The periosteum was reflected with a periosteal elevator.  Bone was removed using the 301 elevator and the tooth was elevated and removed.  The socket was curetted, irrigated, and closed with 3-0 chromic.  The endotracheal tube was repositioned to the other side of the mouth as was the sweetheart retractor and bite block.  The 15 blade was used to make an incision overlying tooth #32 and circumferentially around tooth #31.  The periosteum was reflected.  Bone was removed overlying tooth #32.  The tooth was sectioned and removed with a 301 elevator.  Tooth #31 required removal of bone around the buccal aspect of this tooth and the tooth was elevated and removed with the dental forceps.  The sockets were then curetted, irrigated, and closed with 3-0 chromic.  The oral cavity was inspected, irrigated, and suctioned.  Throat pack was removed.  The patient was awakened, taken to the  recovery room, breathing spontaneously, in good condition.  ESTIMATED BLOOD LOSS:  Minimal.  COMPLICATIONS:  None.  SPECIMENS:  None.     Georgia Lopes, M.D.     SMJ/MEDQ  D:  08/04/2015  T:  08/04/2015  Job:  [id number]

## 2015-08-04 NOTE — Op Note (Signed)
08/04/2015  9:20 AM  PATIENT:  Otilio JeffersonAmber L Bray  25 y.o. female  PRE-OPERATIVE DIAGNOSIS:  IMPACTED TEETH 16, 17, 32, NON-RESTORABLE TEETH 18, 31, POSSIBLE CYST L MANDIBLE  POST-OPERATIVE DIAGNOSIS:  SAME + NO CYST LEFT MANDIBLE  PROCEDURE:  Procedure(s): EXTRACTION TEETH 1, 16, 17, 18, 31, 32  SURGEON:  Surgeon(s): Ocie DoyneScott Sorina Derrig, DDS  ANESTHESIA:   local and general  EBL:  minimal  DRAINS: none   SPECIMEN:  No Specimen  COUNTS:  YES  PLAN OF CARE: Discharge to home after PACU  PATIENT DISPOSITION:  PACU - hemodynamically stable.   PROCEDURE DETAILS: Dictation # [id number]  Georgia LopesScott M. Shiquan Mathieu, DMD 08/04/2015 9:20 AM

## 2015-08-04 NOTE — Anesthesia Procedure Notes (Signed)
Procedure Name: Intubation Date/Time: 08/04/2015 8:48 AM Performed by: Quentin OreWALKER, Alessandro Griep E Pre-anesthesia Checklist: Patient identified, Emergency Drugs available, Suction available, Patient being monitored and Timeout performed Patient Re-evaluated:Patient Re-evaluated prior to inductionOxygen Delivery Method: Circle system utilized Preoxygenation: Pre-oxygenation with 100% oxygen Intubation Type: IV induction Ventilation: Mask ventilation without difficulty Laryngoscope Size: Mac and 4 Grade View: Grade I Tube type: Oral Tube size: 7.0 mm Number of attempts: 1 Airway Equipment and Method: Stylet Placement Confirmation: ETT inserted through vocal cords under direct vision,  positive ETCO2 and breath sounds checked- equal and bilateral Secured at: 21 cm Tube secured with: Tape Dental Injury: Teeth and Oropharynx as per pre-operative assessment

## 2015-08-04 NOTE — Anesthesia Preprocedure Evaluation (Addendum)
Anesthesia Evaluation  Patient identified by MRN, date of birth, ID band Patient awake    Reviewed: Allergy & Precautions, NPO status , Patient's Chart, lab work & pertinent test results  History of Anesthesia Complications (+) PONV and history of anesthetic complications  Airway Mallampati: II  TM Distance: >3 FB Neck ROM: Full    Dental  (+) Teeth Intact, Dental Advisory Given, Chipped   Pulmonary asthma , Current Smoker (1 PPD),    Pulmonary exam normal breath sounds clear to auscultation       Cardiovascular Exercise Tolerance: Good negative cardio ROS Normal cardiovascular exam Rhythm:Regular Rate:Normal     Neuro/Psych PSYCHIATRIC DISORDERS Anxiety Depression  Neuromuscular disease    GI/Hepatic Neg liver ROS, GERD  ,  Endo/Other  Morbid obesity  Renal/GU negative Renal ROS     Musculoskeletal  (+) Arthritis , Fibromyalgia -  Abdominal   Peds  Hematology negative hematology ROS (+)   Anesthesia Other Findings Day of surgery medications reviewed with the patient.  Reproductive/Obstetrics negative OB ROS                            Anesthesia Physical Anesthesia Plan  ASA: III  Anesthesia Plan: General   Post-op Pain Management:    Induction: Intravenous  Airway Management Planned: Oral ETT  Additional Equipment:   Intra-op Plan:   Post-operative Plan: Extubation in OR  Informed Consent: I have reviewed the patients History and Physical, chart, labs and discussed the procedure including the risks, benefits and alternatives for the proposed anesthesia with the patient or authorized representative who has indicated his/her understanding and acceptance.   Dental advisory given  Plan Discussed with: CRNA  Anesthesia Plan Comments: (Risks/benefits of general anesthesia discussed with patient including risk of damage to teeth, lips, gum, and tongue, nausea/vomiting, allergic  reactions to medications, and the possibility of heart attack, stroke and death.  All patient questions answered.  Patient wishes to proceed.)        Anesthesia Quick Evaluation

## 2015-08-04 NOTE — H&P (Signed)
H&P documentation  -History and Physical Reviewed  -Patient has been re-examined  -No change in the plan of care  Heba Ige M  

## 2015-08-06 ENCOUNTER — Encounter (HOSPITAL_COMMUNITY): Payer: Self-pay | Admitting: Oral Surgery

## 2015-08-07 NOTE — Anesthesia Postprocedure Evaluation (Signed)
Anesthesia Post Note  Patient: [name] [name]  Procedure(s) Performed: Procedure(s) (LRB): MULTIPLE EXTRACTION (Bilateral)  Patient location during evaluation: PACU Anesthesia Type: General Level of consciousness: awake and alert Pain management: pain level controlled Vital Signs Assessment: post-procedure vital signs reviewed and stable Respiratory status: spontaneous breathing, nonlabored ventilation, respiratory function stable and patient connected to nasal cannula oxygen Cardiovascular status: blood pressure returned to baseline and stable Postop Assessment: no signs of nausea or vomiting Anesthetic complications: no    Last Vitals:  Vitals:   08/04/15 1015 08/04/15 1035  BP: 128/80 140/83  Pulse: 100   Resp: 16 16  Temp: 36.4 C     Last Pain:  Vitals:   08/04/15 1035  TempSrc:   PainSc: 0-No pain                 Cecile Hearing

## 2015-10-03 ENCOUNTER — Telehealth: Payer: Self-pay | Admitting: Nurse Practitioner

## 2015-10-03 NOTE — Telephone Encounter (Signed)
Patient has been bleeding for three weeks with a little bit of cramping.  She has seen a lot of blood clots and and is concerned that she has been bleeding for so long. She is hoping Shell Rockarolyn or Dr. Lorin PicketScott can work her in this week.

## 2015-10-03 NOTE — Telephone Encounter (Signed)
Spoke with patient and transferred her up to the front for an appointment this week.

## 2015-10-05 ENCOUNTER — Ambulatory Visit (INDEPENDENT_AMBULATORY_CARE_PROVIDER_SITE_OTHER): Payer: Medicaid Other | Admitting: Nurse Practitioner

## 2015-10-05 VITALS — BP 118/80 | Ht 60.0 in | Wt 256.0 lb

## 2015-10-05 DIAGNOSIS — N939 Abnormal uterine and vaginal bleeding, unspecified: Secondary | ICD-10-CM | POA: Diagnosis not present

## 2015-10-05 DIAGNOSIS — R739 Hyperglycemia, unspecified: Secondary | ICD-10-CM | POA: Diagnosis not present

## 2015-10-05 DIAGNOSIS — Z13220 Encounter for screening for lipoid disorders: Secondary | ICD-10-CM

## 2015-10-05 DIAGNOSIS — R5383 Other fatigue: Secondary | ICD-10-CM | POA: Diagnosis not present

## 2015-10-05 DIAGNOSIS — L83 Acanthosis nigricans: Secondary | ICD-10-CM

## 2015-10-06 ENCOUNTER — Telehealth: Payer: Self-pay | Admitting: Family Medicine

## 2015-10-06 ENCOUNTER — Encounter: Payer: Self-pay | Admitting: Nurse Practitioner

## 2015-10-06 ENCOUNTER — Other Ambulatory Visit: Payer: Self-pay | Admitting: Nurse Practitioner

## 2015-10-06 DIAGNOSIS — E559 Vitamin D deficiency, unspecified: Secondary | ICD-10-CM

## 2015-10-06 LAB — LIPID PANEL
CHOL/HDL RATIO: 6.9 ratio — AB (ref 0.0–4.4)
CHOLESTEROL TOTAL: 236 mg/dL — AB (ref 100–199)
HDL: 34 mg/dL — AB (ref >39)
LDL CALC: 175 mg/dL — AB (ref 0–99)
TRIGLYCERIDES: 135 mg/dL (ref 0–149)
VLDL Cholesterol Cal: 27 mg/dL (ref 5–40)

## 2015-10-06 LAB — CBC WITH DIFFERENTIAL/PLATELET
BASOS: 1 %
Basophils Absolute: 0.1 10*3/uL (ref 0.0–0.2)
EOS (ABSOLUTE): 0.5 10*3/uL — ABNORMAL HIGH (ref 0.0–0.4)
EOS: 5 %
HEMATOCRIT: 45.9 % (ref 34.0–46.6)
HEMOGLOBIN: 15.8 g/dL (ref 11.1–15.9)
Immature Grans (Abs): 0 10*3/uL (ref 0.0–0.1)
Immature Granulocytes: 0 %
LYMPHS ABS: 2.1 10*3/uL (ref 0.7–3.1)
Lymphs: 22 %
MCH: 29.2 pg (ref 26.6–33.0)
MCHC: 34.4 g/dL (ref 31.5–35.7)
MCV: 85 fL (ref 79–97)
MONOCYTES: 5 %
MONOS ABS: 0.5 10*3/uL (ref 0.1–0.9)
NEUTROS ABS: 6.7 10*3/uL (ref 1.4–7.0)
Neutrophils: 67 %
Platelets: 299 10*3/uL (ref 150–379)
RBC: 5.41 x10E6/uL — ABNORMAL HIGH (ref 3.77–5.28)
RDW: 13.5 % (ref 12.3–15.4)
WBC: 9.8 10*3/uL (ref 3.4–10.8)

## 2015-10-06 LAB — HEPATIC FUNCTION PANEL
ALT: 22 IU/L (ref 0–32)
AST: 20 IU/L (ref 0–40)
Albumin: 4.3 g/dL (ref 3.5–5.5)
Alkaline Phosphatase: 117 IU/L (ref 39–117)
BILIRUBIN TOTAL: 0.2 mg/dL (ref 0.0–1.2)
BILIRUBIN, DIRECT: 0.06 mg/dL (ref 0.00–0.40)
Total Protein: 7.2 g/dL (ref 6.0–8.5)

## 2015-10-06 LAB — BASIC METABOLIC PANEL
BUN / CREAT RATIO: 15 (ref 9–23)
BUN: 11 mg/dL (ref 6–20)
CALCIUM: 9 mg/dL (ref 8.7–10.2)
CHLORIDE: 100 mmol/L (ref 96–106)
CO2: 22 mmol/L (ref 18–29)
Creatinine, Ser: 0.75 mg/dL (ref 0.57–1.00)
GFR calc non Af Amer: 110 mL/min/{1.73_m2} (ref >59)
GFR, EST AFRICAN AMERICAN: 126 mL/min/{1.73_m2} (ref >59)
GLUCOSE: 95 mg/dL (ref 65–99)
POTASSIUM: 4.6 mmol/L (ref 3.5–5.2)
Sodium: 138 mmol/L (ref 134–144)

## 2015-10-06 LAB — HEMOGLOBIN A1C
ESTIMATED AVERAGE GLUCOSE: 111 mg/dL
Hgb A1c MFr Bld: 5.5 % (ref 4.8–5.6)

## 2015-10-06 LAB — TSH: TSH: 2.22 u[IU]/mL (ref 0.450–4.500)

## 2015-10-06 LAB — VITAMIN D 25 HYDROXY (VIT D DEFICIENCY, FRACTURES): Vit D, 25-Hydroxy: 15.9 ng/mL — ABNORMAL LOW (ref 30.0–100.0)

## 2015-10-06 LAB — BETA HCG QUANT (REF LAB): hCG Quant: <1 m[IU]/mL

## 2015-10-06 LAB — INSULIN, RANDOM: INSULIN: 24.3 u[IU]/mL (ref 2.6–24.9)

## 2015-10-06 MED ORDER — VITAMIN D (ERGOCALCIFEROL) 1.25 MG (50000 UNIT) PO CAPS: 50000.0000 [IU] | ORAL_CAPSULE | ORAL | 1 refills | Status: DC

## 2015-10-06 MED ORDER — MEGESTROL ACETATE 40 MG PO TABS: ORAL_TABLET | ORAL | 0 refills | Status: DC

## 2015-10-06 NOTE — Progress Notes (Signed)
Subjective:  Presents for complaints of persistent menstrual bleeding. Cycles have been regular with slightly heavy flow lasting 3-4 days up until 8/27. Had a light cycle a week early in 3-4 days later began having heavy bleeding, has had bleeding consistently since then heavy to light flow. Is off birth control. Is trying to conceive. Did not have any trouble conceiving her first child was much thinner. Mild acne. No excessive hair growth. Same sexual partner for the past year.  Objective:   BP 118/80   Ht 5' (1.524 m)   Wt 256 lb (116.1 kg)   BMI 50.00 kg/m  NAD. Alert, oriented. Lungs clear. Heart regular rate rhythm. Acanthosis nigricans noted on the posterior neck area. Weight has been stable over the past 6 months.  Assessment: Problem List Items Addressed This Visit      Musculoskeletal and Integument   Acanthosis nigricans     Other   Morbid obesity (HCC)    Other Visit Diagnoses    Abnormal uterine bleeding    -  Primary   Relevant Orders   Beta HCG, Quant (Completed)   Other fatigue       Relevant Orders   Beta HCG, Quant (Completed)   CBC with Differential/Platelet (Completed)   TSH (Completed)   VITAMIN D 25 Hydroxy (Vit-D Deficiency, Fractures) (Completed)   Hepatic function panel (Completed)   Basic metabolic panel (Completed)   Hyperglycemia       Relevant Orders   Hemoglobin A1c (Completed)   Insulin, Fasting   Screening for hyperlipidemia       Relevant Orders   Lipid panel (Completed)      Plan: Further follow-up based on test results. Patient has been actively trying to conceive without birth control for the past year. May need referral to gynecology for evaluation regarding fertility.

## 2015-10-06 NOTE — Telephone Encounter (Signed)
Note sent through my chart and meds called in.

## 2015-10-06 NOTE — Telephone Encounter (Signed)
Pt is calling to find out the results to her labs. Seen yesterday by Eber Jonesarolyn was told she would receive a call back today. Please advise.

## 2015-10-31 ENCOUNTER — Ambulatory Visit (INDEPENDENT_AMBULATORY_CARE_PROVIDER_SITE_OTHER): Payer: Medicaid Other | Admitting: Family Medicine

## 2015-10-31 ENCOUNTER — Encounter: Payer: Self-pay | Admitting: Family Medicine

## 2015-10-31 ENCOUNTER — Telehealth: Payer: Self-pay | Admitting: Family Medicine

## 2015-10-31 VITALS — BP 110/70 | Temp 98.9°F | Ht 60.0 in | Wt 259.0 lb

## 2015-10-31 DIAGNOSIS — J019 Acute sinusitis, unspecified: Secondary | ICD-10-CM | POA: Diagnosis not present

## 2015-10-31 DIAGNOSIS — B9689 Other specified bacterial agents as the cause of diseases classified elsewhere: Secondary | ICD-10-CM

## 2015-10-31 DIAGNOSIS — J4520 Mild intermittent asthma, uncomplicated: Secondary | ICD-10-CM

## 2015-10-31 MED ORDER — ALBUTEROL SULFATE HFA 108 (90 BASE) MCG/ACT IN AERS: 2.0000 | INHALATION_SPRAY | Freq: Four times a day (QID) | RESPIRATORY_TRACT | 2 refills | Status: DC | PRN

## 2015-10-31 MED ORDER — CEFPROZIL 500 MG PO TABS: 500.0000 mg | ORAL_TABLET | Freq: Two times a day (BID) | ORAL | 0 refills | Status: DC

## 2015-10-31 NOTE — Progress Notes (Signed)
   Subjective:    Patient ID: [name] JeffersonAmber L Bray, female    DOB: 23-Apr-1990, 25 y.o.   MRN: [id number]  Sinusitis  This is a new problem. Episode onset: 4 days. Associated symptoms include congestion, coughing, headaches and sneezing. (Wheezing, vomiting) Treatments tried: nyquil, dayquil.    Chills headache and hurting  Frontal  Coughing   Sig wheezing   Smoking has cut down   No sig fever   Review of Systems  HENT: Positive for congestion and sneezing.   Respiratory: Positive for cough.   Neurological: Positive for headaches.       Objective:   Physical Exam Alert, mild malaise. Hydration good Vitals stable. frontal/ maxillary tenderness evident positive nasal congestion. pharynx normal neck supple  lungs clear/no crackles , however present is wheezes. heart regular in rhyth       Assessment & Plan:  Impression rhinosinusitis likely post viral, discussed with patient. plan antibiotics prescribed. Questions answered. Symptomatic care discussed. warning signs discussed. WSL Plus albuterol for reactive airways and encouraged to stop smoking

## 2015-10-31 NOTE — Telephone Encounter (Signed)
Change to zpk

## 2015-10-31 NOTE — Telephone Encounter (Signed)
Patient seen Dr. Brett CanalesSteve today and said she went home and took the cefPROZIL (CEFZIL) 500 MG tablet today and it started giving her stomach cramps and making her really sick.  Please advise.  [facility]

## 2015-11-01 MED ORDER — AZITHROMYCIN 250 MG PO TABS: ORAL_TABLET | ORAL | 0 refills | Status: DC

## 2015-11-01 NOTE — Telephone Encounter (Signed)
Spoke with patient and informed her per Dr.Steve Luking- we are sending in a Zpak. Patient verbalized understanding.

## 2015-11-27 ENCOUNTER — Encounter: Payer: Self-pay | Admitting: Nurse Practitioner

## 2015-11-27 ENCOUNTER — Ambulatory Visit (INDEPENDENT_AMBULATORY_CARE_PROVIDER_SITE_OTHER): Payer: Medicaid Other | Admitting: Nurse Practitioner

## 2015-11-27 VITALS — BP 116/74 | Temp 97.8°F | Ht 60.0 in | Wt 261.0 lb

## 2015-11-27 DIAGNOSIS — J329 Chronic sinusitis, unspecified: Secondary | ICD-10-CM | POA: Diagnosis not present

## 2015-11-27 DIAGNOSIS — J310 Chronic rhinitis: Secondary | ICD-10-CM

## 2015-11-27 DIAGNOSIS — J4521 Mild intermittent asthma with (acute) exacerbation: Secondary | ICD-10-CM

## 2015-11-27 MED ORDER — METHYLPREDNISOLONE ACETATE 40 MG/ML IJ SUSP
40.0000 mg | Freq: Once | INTRAMUSCULAR | Status: AC
  Administered 2015-11-27: 40 mg via INTRAMUSCULAR

## 2015-11-27 MED ORDER — HYDROCODONE-HOMATROPINE 5-1.5 MG/5ML PO SYRP: 5.0000 mL | ORAL_SOLUTION | ORAL | 0 refills | Status: DC | PRN

## 2015-11-27 MED ORDER — PREDNISONE 20 MG PO TABS: ORAL_TABLET | ORAL | 0 refills | Status: DC

## 2015-11-27 MED ORDER — AMOXICILLIN-POT CLAVULANATE 875-125 MG PO TABS: 1.0000 | ORAL_TABLET | Freq: Two times a day (BID) | ORAL | 0 refills | Status: DC

## 2015-11-27 NOTE — Progress Notes (Signed)
Subjective:  Presents for c/o head congestion and cough x 4 d. No fever. Sinus/facial area headache. Runny nose. Frequent cough especially at night. Producing yellow sputum. No sore throat. Right ear pain. Wheezing. Using her inhaler 3-4 times per day. Had not used it in a long time. Last used about an hour ago. No V/D or abd pain.   Objective:   BP 116/74   Temp 97.8 F (36.6 C) (Oral)   Ht 5' (1.524 m)   Wt 261 lb (118.4 kg)   BMI 50.97 kg/m  NAD. Alert, oriented. Right TM partially obscured with cerumen; no erythema. Left TM: clear effusion. Pharynx erythema with yellow PND noted. Neck supple with mild tender anterior adenopathy. Lungs clear. No wheezing or tachypnea. Heart RRR.   Assessment:  Problem List Items Addressed This Visit      Respiratory   Asthma - Primary   Relevant Medications   methylPREDNISolone acetate (DEPO-MEDROL) injection 40 mg (Completed)   predniSONE (DELTASONE) 20 MG tablet    Other Visit Diagnoses    Rhinosinusitis       Relevant Medications   HYDROcodone-homatropine (HYCODAN) 5-1.5 MG/5ML syrup   methylPREDNISolone acetate (DEPO-MEDROL) injection 40 mg (Completed)   predniSONE (DELTASONE) 20 MG tablet   amoxicillin-clavulanate (AUGMENTIN) 875-125 MG tablet     Plan:  Meds ordered this encounter  Medications  . DISCONTD: amoxicillin-clavulanate (AUGMENTIN) 875-125 MG tablet    Sig: Take 1 tablet by mouth 2 (two) times daily.    Dispense:  20 tablet    Refill:  0    Order Specific Question:   Supervising Provider    Answer:   Merlyn AlbertLUKING, WILLIAM S [[id number]]  . HYDROcodone-homatropine (HYCODAN) 5-1.5 MG/5ML syrup    Sig: Take 5 mLs by mouth every 4 (four) hours as needed.    Dispense:  120 mL    Refill:  0    Order Specific Question:   Supervising Provider    Answer:   Merlyn AlbertLUKING, WILLIAM S [[id number]]  . DISCONTD: predniSONE (DELTASONE) 20 MG tablet    Sig: 3 po qd x 3 d then 2 po qd x 3 d then 1 po qd x 3 d    Dispense:  18 tablet    Refill:  0    Order  Specific Question:   Supervising Provider    Answer:   Merlyn AlbertLUKING, WILLIAM S [[id number]]  . methylPREDNISolone acetate (DEPO-MEDROL) injection 40 mg  . predniSONE (DELTASONE) 20 MG tablet    Sig: 3 po qd x 3 d then 2 po qd x 3 d then 1 po qd x 3 d    Dispense:  18 tablet    Refill:  0  . amoxicillin-clavulanate (AUGMENTIN) 875-125 MG tablet    Sig: Take 1 tablet by mouth 2 (two) times daily.    Dispense:  20 tablet    Refill:  0   OTC meds as directed for daytime cough. Call back if worsens or persists.  Discussed smoking cessation.

## 2015-12-04 ENCOUNTER — Other Ambulatory Visit: Payer: Self-pay | Admitting: Nurse Practitioner

## 2015-12-04 ENCOUNTER — Telehealth: Payer: Self-pay | Admitting: Nurse Practitioner

## 2015-12-04 NOTE — Telephone Encounter (Signed)
I want to prescribe Levaquin but need to be sure no possibility of pregnancy.

## 2015-12-04 NOTE — Telephone Encounter (Signed)
Patient states that there is no possibility of pregnancy. Patient also wants some cough syrup prescribed for her bad cough.

## 2015-12-04 NOTE — Telephone Encounter (Signed)
Patient has a very hoarse voice, bad cough and wheezing. No fever, shortness of breath or any other symptoms. [facility]. Patient has completed antibiotic and steroid recently prescribed.

## 2015-12-04 NOTE — Telephone Encounter (Signed)
Pt called stating that she feels better but that she has not voice and is still coughing pretty bad. Please advise.

## 2015-12-05 ENCOUNTER — Other Ambulatory Visit: Payer: Self-pay | Admitting: Nurse Practitioner

## 2015-12-05 MED ORDER — LEVOFLOXACIN 500 MG PO TABS: 500.0000 mg | ORAL_TABLET | Freq: Every day | ORAL | 0 refills | Status: DC

## 2015-12-05 MED ORDER — HYDROCODONE-HOMATROPINE 5-1.5 MG/5ML PO SYRP: 5.0000 mL | ORAL_SOLUTION | ORAL | 0 refills | Status: DC | PRN

## 2015-12-05 NOTE — Telephone Encounter (Signed)
Pt notified on voicemail  °

## 2015-12-05 NOTE — Telephone Encounter (Signed)
Will give one more Rx for hycodan syrup. Printed.

## 2016-01-16 ENCOUNTER — Telehealth: Payer: Self-pay | Admitting: Nurse Practitioner

## 2016-01-16 NOTE — Telephone Encounter (Signed)
Requesting refill on hydrocodone due to her back.  She said the cold is making her back pain worse.

## 2016-01-17 MED ORDER — HYDROCODONE-ACETAMINOPHEN 5-325 MG PO TABS: 1.0000 | ORAL_TABLET | ORAL | 0 refills | Status: DC | PRN

## 2016-01-17 NOTE — Telephone Encounter (Signed)
Discussed with pt. Pt verbalized understanding.  °

## 2016-01-17 NOTE — Telephone Encounter (Signed)
Hydrocodone 5 mg/325 mg, #15, one every 4 hours when necessary severe pain, patient will need office visit before further because this is a class II medicine patient was given a limited quantity because of phone call related request-will need office visit for larger amount

## 2016-03-12 ENCOUNTER — Ambulatory Visit (HOSPITAL_COMMUNITY)
Admission: RE | Admit: 2016-03-12 | Discharge: 2016-03-12 | Disposition: A | Discharge status: Home / Self Care | Payer: Medicaid Other | Source: Ambulatory Visit | Attending: Family Medicine | Admitting: Family Medicine

## 2016-03-12 ENCOUNTER — Ambulatory Visit (INDEPENDENT_AMBULATORY_CARE_PROVIDER_SITE_OTHER): Payer: Medicaid Other | Admitting: Family Medicine

## 2016-03-12 VITALS — BP 122/88 | Ht 60.0 in | Wt 264.0 lb

## 2016-03-12 DIAGNOSIS — M79662 Pain in left lower leg: Secondary | ICD-10-CM | POA: Diagnosis present

## 2016-03-12 MED ORDER — HYDROCODONE-ACETAMINOPHEN 5-325 MG PO TABS: 1.0000 | ORAL_TABLET | ORAL | 0 refills | Status: DC | PRN

## 2016-03-12 MED ORDER — IBUPROFEN 600 MG PO TABS: 600.0000 mg | ORAL_TABLET | Freq: Three times a day (TID) | ORAL | 0 refills | Status: DC | PRN

## 2016-03-12 NOTE — Progress Notes (Signed)
   Subjective:    Patient ID: [name] [name], female    DOB: [birth year], 26 y.o.   MRN: [id number]  Fall  The accident occurred 6 to 12 hours ago. Fall occurred: walking and tripped. fell and hit a crate. Point of impact: left leg.  Patient was working at [employer] she states that she slipped fell landed on a crate injured her lower leg. Pain discomfort with walking difficult to walk difficulty putting weight on  Swollen and 2 separate spots on the anterior portion of the lower leg no bleeding  Review of Systems Denies ankle pain foot pain hip pain    Objective:   Physical Exam Lungs clear heart regular lower leg tenderness in the left lower leg knee is normal ankle normal       Assessment & Plan:  Contusions with significant limping use crutches over the next few days cold compresses as directed anti-inflammatories for the next few days along with pain medication when necessary caution drowsiness stat x-rays ordered  X-ray did not show fracture discussed with patient follow-up if problems

## 2016-04-04 ENCOUNTER — Ambulatory Visit (INDEPENDENT_AMBULATORY_CARE_PROVIDER_SITE_OTHER): Payer: Medicaid Other | Admitting: Family Medicine

## 2016-04-04 ENCOUNTER — Encounter: Payer: Self-pay | Admitting: Family Medicine

## 2016-04-04 VITALS — BP 136/90 | Ht 60.0 in | Wt 263.2 lb

## 2016-04-04 DIAGNOSIS — F4320 Adjustment disorder, unspecified: Secondary | ICD-10-CM | POA: Diagnosis not present

## 2016-04-04 DIAGNOSIS — F410 Panic disorder [episodic paroxysmal anxiety] without agoraphobia: Secondary | ICD-10-CM

## 2016-04-04 DIAGNOSIS — R030 Elevated blood-pressure reading, without diagnosis of hypertension: Secondary | ICD-10-CM

## 2016-04-04 DIAGNOSIS — F4321 Adjustment disorder with depressed mood: Secondary | ICD-10-CM

## 2016-04-04 MED ORDER — ALPRAZOLAM 0.5 MG PO TABS: 0.5000 mg | ORAL_TABLET | Freq: Three times a day (TID) | ORAL | 1 refills | Status: DC | PRN

## 2016-04-04 NOTE — Progress Notes (Signed)
   Subjective:    Patient ID: [name] JeffersonAmber L Bray, female    DOB: Sep 23, 1990, 26 y.o.   MRN: [id number]  HPI Patient arrives with c/o panic attacks and high blood pressure this week due to stress of death of loved one. Patient states she is under a lot of stress this week and having panic attacks. The patient is going through the loss of 2 level ones in the past week one was uncle 1 was her great aunt. She states she is not depressed she is just sad. In addition to this at times she feels overwhelmed at times anxious denies any nausea or vomiting states her blood pressure is been up denies chest pressure tightness occasionally gets a little bit of a headache although not severe  Review of Systems Please see above, denies chest pain or shortness of breath    Objective:   Physical Exam Lungs are clear hearts regular pulse normal  Blood pressure 130/88 130/90     Assessment & Plan:  Borderline blood pressure recheck in 2 weeks  Anxiety, grief, panic attacks Xanax 0.5 mg maximum 3 times a day as needed for overwhelming anxiousness or panic attacks patient was warned about the possibility of depression if she gets this way she needs follow-up right away I recommend rechecking her in 2 weeks  Referral for counseling  Patient was warned not to over rely on nerve medication to help her and she was instructed about the potential for addictive this  We did discuss alternatives to medications to help with stress

## 2016-04-05 ENCOUNTER — Encounter: Payer: Self-pay | Admitting: Family Medicine

## 2016-04-05 NOTE — Progress Notes (Signed)
Referral ordered in epic for psychology.

## 2016-04-05 NOTE — Addendum Note (Signed)
Addended by: Jeralene PetersREWS, SHANNON R on: 04/05/2016 08:32 AM   Modules accepted: Orders

## 2016-04-19 ENCOUNTER — Ambulatory Visit: Payer: Medicaid Other | Admitting: Family Medicine

## 2016-04-22 ENCOUNTER — Encounter: Payer: Self-pay | Admitting: Family Medicine

## 2016-04-22 ENCOUNTER — Ambulatory Visit (INDEPENDENT_AMBULATORY_CARE_PROVIDER_SITE_OTHER): Payer: Self-pay | Admitting: Family Medicine

## 2016-04-22 VITALS — BP 120/82 | Ht 60.0 in | Wt 266.4 lb

## 2016-04-22 DIAGNOSIS — F410 Panic disorder [episodic paroxysmal anxiety] without agoraphobia: Secondary | ICD-10-CM

## 2016-04-22 DIAGNOSIS — N939 Abnormal uterine and vaginal bleeding, unspecified: Secondary | ICD-10-CM

## 2016-04-22 LAB — POCT URINE PREGNANCY: Preg Test, Ur: NEGATIVE

## 2016-04-22 NOTE — Progress Notes (Signed)
   Subjective:    Patient ID: [name] [name], female    DOB: Jun 22, 1990, 26 y.o.   MRN: [id number]  Anxiety  Presents for follow-up visit.     Patient states her panic attacks are doing much better. She denies any depression symptoms has had 2 uses Xanax only rarely she is set to see counselor coming up she just recently got married and states that that is a good thing. Patient in today for a 2 week follow up on Anxiety attacks.   Patient also has concerns of spotting. She is had this before is having some intermittent spotting no high fever chills vomiting rectal bleeding etc.   Review of Systems Please see above. No abdominal pain.    Objective:   Physical Exam Urine pregnancy negative Lungs clear heart regular Patient was instructed should her spotting get worse or more persistent to follow-up here or gynecology       Assessment & Plan:  Abnormal uterine bleeding more than likely estrogen excess with her morbid obesity patient encouraged to lose weight if she has ongoing troubles she needs to follow-up as discussed above  Anxiety panic overall doing much better Xanax sparingly caution drowsiness not for frequent use  Follow-up counseling follow-up here when necessary

## 2016-05-02 ENCOUNTER — Encounter: Payer: Self-pay | Admitting: Family Medicine

## 2016-05-23 ENCOUNTER — Encounter: Payer: Self-pay | Admitting: Family Medicine

## 2016-06-13 ENCOUNTER — Encounter (HOSPITAL_COMMUNITY): Payer: Self-pay | Admitting: Emergency Medicine

## 2016-06-13 ENCOUNTER — Emergency Department (HOSPITAL_COMMUNITY)
Admission: EM | Admit: 2016-06-13 | Discharge: 2016-06-13 | Disposition: A | Discharge status: Home / Self Care | Payer: Self-pay | Attending: Emergency Medicine | Admitting: Emergency Medicine

## 2016-06-13 ENCOUNTER — Emergency Department (HOSPITAL_COMMUNITY): Payer: Self-pay

## 2016-06-13 DIAGNOSIS — J45909 Unspecified asthma, uncomplicated: Secondary | ICD-10-CM | POA: Insufficient documentation

## 2016-06-13 DIAGNOSIS — N3001 Acute cystitis with hematuria: Secondary | ICD-10-CM | POA: Insufficient documentation

## 2016-06-13 DIAGNOSIS — N23 Unspecified renal colic: Secondary | ICD-10-CM | POA: Insufficient documentation

## 2016-06-13 DIAGNOSIS — G8929 Other chronic pain: Secondary | ICD-10-CM | POA: Insufficient documentation

## 2016-06-13 DIAGNOSIS — Z79899 Other long term (current) drug therapy: Secondary | ICD-10-CM | POA: Insufficient documentation

## 2016-06-13 DIAGNOSIS — F17210 Nicotine dependence, cigarettes, uncomplicated: Secondary | ICD-10-CM | POA: Insufficient documentation

## 2016-06-13 DIAGNOSIS — N83201 Unspecified ovarian cyst, right side: Secondary | ICD-10-CM | POA: Insufficient documentation

## 2016-06-13 LAB — URINALYSIS, ROUTINE W REFLEX MICROSCOPIC
Bilirubin Urine: NEGATIVE
GLUCOSE, UA: NEGATIVE mg/dL
KETONES UR: NEGATIVE mg/dL
Nitrite: NEGATIVE
PH: 5 (ref 5.0–8.0)
PROTEIN: 30 mg/dL — AB
Specific Gravity, Urine: 1.026 (ref 1.005–1.030)

## 2016-06-13 LAB — PREGNANCY, URINE: Preg Test, Ur: NEGATIVE

## 2016-06-13 MED ORDER — HYDROCODONE-ACETAMINOPHEN 5-325 MG PO TABS: 1.0000 | ORAL_TABLET | ORAL | 0 refills | Status: DC | PRN

## 2016-06-13 MED ORDER — HYDROMORPHONE HCL 1 MG/ML IJ SOLN
1.0000 mg | Freq: Once | INTRAMUSCULAR | Status: AC
  Administered 2016-06-13: 1 mg via INTRAVENOUS
  Filled 2016-06-13: qty 1

## 2016-06-13 MED ORDER — ONDANSETRON HCL 4 MG/2ML IJ SOLN
4.0000 mg | Freq: Once | INTRAMUSCULAR | Status: AC
  Administered 2016-06-13: 4 mg via INTRAVENOUS
  Filled 2016-06-13: qty 2

## 2016-06-13 MED ORDER — CIPROFLOXACIN HCL 500 MG PO TABS: 500.0000 mg | ORAL_TABLET | Freq: Two times a day (BID) | ORAL | 0 refills | Status: DC

## 2016-06-13 MED ORDER — KETOROLAC TROMETHAMINE 60 MG/2ML IM SOLN
60.0000 mg | Freq: Once | INTRAMUSCULAR | Status: AC
  Administered 2016-06-13: 60 mg via INTRAMUSCULAR
  Filled 2016-06-13: qty 2

## 2016-06-13 NOTE — Discharge Instructions (Signed)
Your imaging is negative today for a kidney stone or any obvious renal pathology. You do have evidence of a urinary infection - take the entire course of the antibiotics prescribed.  You may take the hydrocodone prescribed for pain relief.  This will make you drowsy - do not drive within 4 hours of taking this medication.  You have a fairly large right ovarian cyst. This is not the source of your pain, however.  Please follow up for a recheck with your primary doctor for a recheck if your symptoms persist.

## 2016-06-13 NOTE — ED Notes (Signed)
Patient transported to Ultrasound 

## 2016-06-13 NOTE — ED Triage Notes (Signed)
Pt c/o l flank pain with urinary freq with little urine. Grimacing noted. Hx of kidney stones

## 2016-06-15 LAB — URINE CULTURE: SPECIAL REQUESTS: NORMAL

## 2016-06-15 NOTE — ED Provider Notes (Signed)
[facility] Provider Note   CSN: [id number] Arrival date & time: 06/13/16  4098     History   Chief Complaint Chief Complaint  Patient presents with  . Flank Pain    HPI [name] [name] is a 26 y.o. female with a history as outlined below, most significant for kidney stones presenting with acute left flank pain in association with urinary frequency of small amounts of nonbloody appearing urine and burning with urination.  These symptoms woke her from sleep last night.  She also endorses nausea without emesis and subjective fever, but describes feeling flushed and hot when the wave of increased flank pain occur. She denies cp, sob, diarrhea.  Her last kidney stone occurred when she was a teenager and endorses having to have a stent placed in order to pass that stone.  She has taken ibuprofen without relief of pain.  The history is provided by the patient.    Past Medical History:  Diagnosis Date  . Asthma   . Bulging disc   . Chronic back pain   . Chronic pain    Dr. Murray Hodgkins in [place]  . Complication of anesthesia    larynaspasm after extubation following Cholecystectomy  . DDD (degenerative disc disease)   . Depression   . Fibromyalgia   . GERD (gastroesophageal reflux disease)   . History of bronchitis   . Kidney stone   . Numbness and tingling in hands   . Panic attack   . PONV (postoperative nausea and vomiting)    "a little bit of nausea"  . Popping of temporomandibular joint on opening of jaw   . Renal disorder    kidney stones  . Scoliosis   . Spinal bifida, closed   . 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 Vitamin D deficiency 10/06/2015  . Depression with anxiety 10/06/2014  . Encounter for long-term opiate analgesic use 07/28/2014  . Acanthosis nigricans 04/29/2014  . Morbid obesity (HCC) 02/13/2014  . Fibromyalgia 04/03/2012  . Asthma 04/03/2012  . Kidney stones 04/03/2012  . Right anterior  knee pain 08/29/2011  . Periumbilical pain 05/29/2010  . NAUSEA AND VOMITING 01/23/2010  . DIARRHEA 01/23/2010  . RUQ PAIN 01/23/2010  . 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 Chronic back pain 03/17/2008    Past Surgical History:  Procedure Laterality Date  . BREATH HYDROGEN TEST  08/08/2010      . CERVICAL CONIZATION W/BX N/A 06/01/2014   Procedure: LASER CONIZATION OF CERVIX;  Surgeon: Lazaro Arms, MD;  Location: [facility] ORS;  Service: Gynecology;  Laterality: N/A;  . CHOLECYSTECTOMY N/A 03/26/2013   Procedure: LAPAROSCOPIC CHOLECYSTECTOMY;  Surgeon: Dalia Heading, MD;  Location: [facility] ORS;  Service: General;  Laterality: N/A;  . COLONOSCOPY  01/2010   scattered petechiae and fibrotic-appearing mucosa of uncertain significance , ?resolving infection, bx benign  . CYSTOSCOPY W/ URETERAL STENT PLACEMENT  05/28/2011   Procedure: CYSTOSCOPY WITH RETROGRADE PYELOGRAM/URETERAL STENT PLACEMENT;  Surgeon: Ky Barban, MD;  Location: [facility] ORS;  Service: Urology;  Laterality: Right;  Cystoscopy with Right Retrograde Pyelogram  . CYSTOSCOPY WITH URETHRAL DILATATION  05/28/2011   Procedure: CYSTOSCOPY WITH URETHRAL DILATATION;  Surgeon: Ky Barban, MD;  Location: [facility] ORS;  Service: Urology;  Laterality: N/A;  . ESOPHAGOGASTRODUODENOSCOPY  01/2010   circumferential distal esophageal erosions, hiatal hernia, excoriations/erosion in gastric body ?trauma from vomiting, bx negative and showed chronic gastritis  .  ESOPHAGOGASTRODUODENOSCOPY N/A 02/17/2013   Procedure: ESOPHAGOGASTRODUODENOSCOPY (EGD);  Surgeon: Corbin Ade, MD;  Location: [facility] END[facility] SUITE;  Service: Endoscopy;  Laterality: N/A;  11:30-moved to [address] to notify pt  . HOLMIUM LASER APPLICATION N/A 06/01/2014   Procedure: HOLMIUM LASER APPLICATION;  Surgeon: Lazaro Arms, MD;  Location: [facility] ORS;  Service: Gynecology;  Laterality: N/A;  . HYDROGEN BREATH TEST  08/06/2010   Procedure: HYDROGEN BREATH TEST;  Surgeon: Corbin Ade, MD;  Location: [facility] ORS;  Service: Gastroenterology;  Laterality: N/A;  . MULTIPLE EXTRACTIONS WITH ALVEOLOPLASTY Bilateral 08/04/2015   Procedure: MULTIPLE EXTRACTION;  Surgeon: Ocie Doyne, DDS;  Location: [facility] OR;  Service: Oral Surgery;  Laterality: Bilateral;  . RENAL LITHIASIS REMOVED    . STONE EXTRACTION WITH BASKET  05/28/2011   Procedure: STONE EXTRACTION WITH BASKET;  Surgeon: Ky Barban, MD;  Location: [facility] ORS;  Service: Urology;  Laterality: Right;    OB History    Gravida Para Term Preterm AB Living   1 1 1     1    SAB TAB Ectopic Multiple Live Births                   Home Medications    Prior to Admission medications   Medication Sig Start Date End Date Taking? Authorizing Provider  ALPRAZolam Prudy Feeler) 0.5 MG tablet Take 1 tablet (0.5 mg total) by mouth 3 (three) times daily as needed for sleep or anxiety. 04/04/16   Babs Sciara, MD  ciprofloxacin (CIPRO) 500 MG tablet Take 1 tablet (500 mg total) by mouth every 12 (twelve) hours. 06/13/16   Burgess Amor, PA-C  HYDROcodone-acetaminophen (NORCO/VICODIN) 5-325 MG tablet Take 1 tablet by mouth every 4 (four) hours as needed. 06/13/16   Burgess Amor, PA-C  ibuprofen (ADVIL,MOTRIN) 600 MG tablet Take 1 tablet (600 mg total) by mouth every 8 (eight) hours as needed. Patient not taking: Reported on 04/22/2016 03/12/16   Babs Sciara, MD    Family History Family History  Problem Relation Age of Onset  . Multiple sclerosis Father   . Cervical cancer Mother   . Hypertension Mother   . Heart disease Unknown   . Lung disease Unknown   . Asthma Unknown   . Diabetes Unknown   . Kidney disease Unknown   . Anesthesia problems Neg Hx   . Hypotension Neg Hx   . Malignant hyperthermia Neg Hx   . Pseudochol deficiency Neg Hx   . Colon cancer Neg Hx     Social History Social History  Substance Use Topics  . Smoking status: Current Every Day Smoker    Packs/day: 1.00    Years: 10.00    Types: Cigarettes  .  Smokeless tobacco: Never Used  . Alcohol use No     Allergies   Sulfamethoxazole; Cefzil [cefprozil]; Penicillins; Sulfa antibiotics; and Sulfonamide derivatives   Review of Systems Review of Systems  Constitutional: Positive for fever.  HENT: Negative.  Negative for congestion and sore throat.   Eyes: Negative.   Respiratory: Negative for chest tightness and shortness of breath.   Cardiovascular: Negative for chest pain.  Gastrointestinal: Positive for nausea. Negative for abdominal pain, diarrhea and vomiting.  Genitourinary: Positive for dysuria, flank pain and frequency. Negative for vaginal discharge.  Musculoskeletal: Negative for arthralgias, joint swelling and neck pain.  Skin: Negative.  Negative for rash and wound.  Neurological: Negative for dizziness, weakness, light-headedness, numbness and headaches.  Psychiatric/Behavioral: Negative.  Physical Exam Updated Vital Signs BP (!) 102/56 (BP Location: Left Arm)   Pulse 73   Temp 98 F (36.7 C) (Oral)   Resp 16   Wt 122.5 kg (270 lb)   LMP 06/02/2016   SpO2 99%   BMI 52.73 kg/m   Physical Exam  Constitutional: She appears well-developed and well-nourished.  HENT:  Head: Normocephalic and atraumatic.  Eyes: Conjunctivae are normal.  Neck: Normal range of motion.  Cardiovascular: Normal rate, regular rhythm, normal heart sounds and intact distal pulses.   Pulmonary/Chest: Effort normal and breath sounds normal. She has no wheezes.  Abdominal: Soft. Bowel sounds are normal. There is no tenderness. There is CVA tenderness.  Left cva ttp. No abdominal or pelvic pain, guarding or rebound.  Musculoskeletal: Normal range of motion.  Neurological: She is alert.  Skin: Skin is warm and dry.  Psychiatric: She has a normal mood and affect.  Nursing note and vitals reviewed.    ED Treatments / Results  Labs (all labs ordered are listed, but only abnormal results are displayed) Labs Reviewed  URINE CULTURE -  Abnormal; Notable for the following:       Result Value   Culture MULTIPLE SPECIES PRESENT, SUGGEST RECOLLECTION (*)    All other components within normal limits  URINALYSIS, ROUTINE W REFLEX MICROSCOPIC - Abnormal; Notable for the following:    APPearance CLOUDY (*)    Hgb urine dipstick LARGE (*)    Protein, ur 30 (*)    Leukocytes, UA LARGE (*)    Bacteria, UA RARE (*)    Squamous Epithelial / LPF 6-30 (*)    All other components within normal limits  PREGNANCY, URINE    EKG  EKG Interpretation None       Radiology   US Transvaginal Non-ob  Result Date: 06/13/2016 CLINICAL DATA:  Large right ovarian cyst. Abnormal uterine bleeding. EXAM: TRANSABDOMINAL AND TRANSVAGINAL ULTRASOUND OF PELVIS DOPPLER ULTRASOUND OF OVARIES TECHNIQUE: Both transabdominal and transvaginal ultrasound examinations of the pelvis were performed. Transabdominal technique was performed for global imaging of the pelvis including uterus, ovaries, adnexal regions, and pelvic cul-de-sac. It was necessary to proceed with endovaginal exam following the transabdominal exam to visualize the CT scan earlier today. Color and duplex Doppler ultrasound was utilized to evaluate blood flow to the ovaries. COMPARISON:  None. FINDINGS: Uterus Measurements: 7.4 x 3.8 x 4.7 cm. No fibroids or other mass visualized. Endometrium Thickness: 6 mm.  No focal abnormality visualized. Right ovary Measurements: 4.6 x 4.1 x 4.2 cm. Simple appearing cystic lesion without internal septation or architecture measuring up to 3.8 cm identified. Left ovary Measurements: 3.0 x 2.4 x 2.1 cm. Normal appearance/no adnexal mass. Small follicle evident. Pulsed Doppler evaluation of both ovaries was limited by body habitus. Sonographer was unable to confidently demonstrate flow signal within the parenchyma of either ovary. Other findings No abnormal free fluid. IMPRESSION: 3.8 cm cyst with benign characteristics identified in the right ovary. Due to  patient body habitus, reliable assessment of blood flow in the ovaries could not be performed. No intraperitoneal free fluid. Electronically Signed   By: Kennith Center M.D.   On: 06/13/2016 12:48   US Pelvis Complete  Result Date: 06/13/2016 CLINICAL DATA:  Large right ovarian cyst. Abnormal uterine bleeding. EXAM: TRANSABDOMINAL AND TRANSVAGINAL ULTRASOUND OF PELVIS DOPPLER ULTRASOUND OF OVARIES TECHNIQUE: Both transabdominal and transvaginal ultrasound examinations of the pelvis were performed. Transabdominal technique was performed for global imaging of the pelvis including uterus, ovaries, adnexal regions,  and pelvic cul-de-sac. It was necessary to proceed with endovaginal exam following the transabdominal exam to visualize the CT scan earlier today. Color and duplex Doppler ultrasound was utilized to evaluate blood flow to the ovaries. COMPARISON:  None. FINDINGS: Uterus Measurements: 7.4 x 3.8 x 4.7 cm. No fibroids or other mass visualized. Endometrium Thickness: 6 mm.  No focal abnormality visualized. Right ovary Measurements: 4.6 x 4.1 x 4.2 cm. Simple appearing cystic lesion without internal septation or architecture measuring up to 3.8 cm identified. Left ovary Measurements: 3.0 x 2.4 x 2.1 cm. Normal appearance/no adnexal mass. Small follicle evident. Pulsed Doppler evaluation of both ovaries was limited by body habitus. Sonographer was unable to confidently demonstrate flow signal within the parenchyma of either ovary. Other findings No abnormal free fluid. IMPRESSION: 3.8 cm cyst with benign characteristics identified in the right ovary. Due to patient body habitus, reliable assessment of blood flow in the ovaries could not be performed. No intraperitoneal free fluid. Electronically Signed   By: Kennith Center M.D.   On: 06/13/2016 12:48   [place] Art/ven Flow Abd Pelv Doppler  Result Date: 06/13/2016 CLINICAL DATA:  Large right ovarian cyst. Abnormal uterine bleeding. EXAM: TRANSABDOMINAL AND  TRANSVAGINAL ULTRASOUND OF PELVIS DOPPLER ULTRASOUND OF OVARIES TECHNIQUE: Both transabdominal and transvaginal ultrasound examinations of the pelvis were performed. Transabdominal technique was performed for global imaging of the pelvis including uterus, ovaries, adnexal regions, and pelvic cul-de-sac. It was necessary to proceed with endovaginal exam following the transabdominal exam to visualize the CT scan earlier today. Color and duplex Doppler ultrasound was utilized to evaluate blood flow to the ovaries. COMPARISON:  None. FINDINGS: Uterus Measurements: 7.4 x 3.8 x 4.7 cm. No fibroids or other mass visualized. Endometrium Thickness: 6 mm.  No focal abnormality visualized. Right ovary Measurements: 4.6 x 4.1 x 4.2 cm. Simple appearing cystic lesion without internal septation or architecture measuring up to 3.8 cm identified. Left ovary Measurements: 3.0 x 2.4 x 2.1 cm. Normal appearance/no adnexal mass. Small follicle evident. Pulsed Doppler evaluation of both ovaries was limited by body habitus. Sonographer was unable to confidently demonstrate flow signal within the parenchyma of either ovary. Other findings No abnormal free fluid. IMPRESSION: 3.8 cm cyst with benign characteristics identified in the right ovary. Due to patient body habitus, reliable assessment of blood flow in the ovaries could not be performed. No intraperitoneal free fluid. Electronically Signed   By: Kennith Center M.D.   On: 06/13/2016 12:48   Ct Renal Stone Study  Result Date: 06/13/2016 CLINICAL DATA:  Left flank pain. EXAM: CT ABDOMEN AND PELVIS WITHOUT CONTRAST TECHNIQUE: Multidetector CT imaging of the abdomen and pelvis was performed following the standard protocol without IV contrast. COMPARISON:  CT scan of April 07, 2015. FINDINGS: Lower chest: No acute abnormality. Hepatobiliary: No focal liver abnormality is seen. Status post cholecystectomy. No biliary dilatation. Pancreas: Unremarkable. No pancreatic ductal dilatation or  surrounding inflammatory changes. Spleen: Normal in size without focal abnormality. Adrenals/Urinary Tract: Adrenal glands appear normal. Nonobstructive left nephrolithiasis is noted. No hydronephrosis or renal obstruction is noted. Urinary bladder is unremarkable. Stomach/Bowel: Stomach is within normal limits. Appendix appears normal. No evidence of bowel wall thickening, distention, or inflammatory changes. Diverticulosis of descending and sigmoid colon is noted. Vascular/Lymphatic: No significant vascular findings are present. No enlarged abdominal or pelvic lymph nodes. Reproductive: Uterus and left ovary unremarkable. 4.7 cm right ovarian cyst is noted. Other: No abdominal wall hernia or abnormality. No abdominopelvic ascites. Musculoskeletal: No acute or significant  osseous findings. IMPRESSION: Nonobstructive left nephrolithiasis. No hydronephrosis or renal obstruction is noted. Diverticulosis of descending and sigmoid colon is noted without inflammation. 4.7 cm right ovarian cyst. Pelvic ultrasound is recommended for further evaluation. Electronically Signed   By: Lupita RaiderJames  Green Jr, M.D.   On: 06/13/2016 11:00     Procedures Procedures (including critical care time)  Medications Ordered in ED Medications  HYDROmorphone (DILAUDID) injection 1 mg (1 mg Intravenous Given 06/13/16 1036)  ondansetron (ZOFRAN) injection 4 mg (4 mg Intravenous Given 06/13/16 1036)  ketorolac (TORADOL) injection 60 mg (60 mg Intramuscular Given 06/13/16 1339)     Initial Impression / Assessment and Plan / ED Course  I have reviewed the triage vital signs and the nursing notes.  Pertinent labs & imaging results that were available during my care of the patient were reviewed by me and considered in my medical decision making (see chart for details).     Labs and imaging reviewed.  Pt without obvious renal or uretal stone, no hydronephrosis. Given sx,  Pt possible has passed a stone, now with ureteral colic.  She does  have evidence of infected urine, cx sent, will start abx.  US performed based on CT findings and radiology recs.  Pt does not have pelvic pain on exam or by hx.  Re-exam still pain free lower abd without any signs to suggest ovarian cyst pain or torsion.  She was prescribed abx, pain medication. Advised recheck by pcp for a recheck of her sx.  Also suggested f/u with her gyn [facility] re ovarian cyst with any development of pain or concerns.  The patient appears reasonably screened and/or stabilized for discharge and I doubt any other medical condition or other [facility] requiring further screening, evaluation, or treatment in the ED at this time prior to discharge.   Final Clinical Impressions(s) / ED Diagnoses   Final diagnoses:  Ureteral colic  Acute cystitis with hematuria  Cyst of right ovary    New Prescriptions Discharge Medication List as of 06/13/2016  1:26 PM    START taking these medications   Details  ciprofloxacin (CIPRO) 500 MG tablet Take 1 tablet (500 mg total) by mouth every 12 (twelve) hours., Starting Thu 06/13/2016, Print    HYDROcodone-acetaminophen (NORCO/VICODIN) 5-325 MG tablet Take 1 tablet by mouth every 4 (four) hours as needed., Starting Thu 06/13/2016, Print         Burgess Amordol, Dwayn [place], PA-C 06/15/16 1247    Pricilla LovelessGoldston, Scott, MD 06/20/16 [id number]

## 2017-02-25 ENCOUNTER — Emergency Department (HOSPITAL_COMMUNITY): Payer: Self-pay

## 2017-02-25 ENCOUNTER — Emergency Department (HOSPITAL_COMMUNITY)
Admission: EM | Admit: 2017-02-25 | Discharge: 2017-02-25 | Disposition: A | Discharge status: Home / Self Care | Payer: Self-pay | Attending: Emergency Medicine | Admitting: Emergency Medicine

## 2017-02-25 ENCOUNTER — Other Ambulatory Visit: Payer: Self-pay

## 2017-02-25 ENCOUNTER — Encounter (HOSPITAL_COMMUNITY): Payer: Self-pay | Admitting: Emergency Medicine

## 2017-02-25 DIAGNOSIS — Z79899 Other long term (current) drug therapy: Secondary | ICD-10-CM | POA: Insufficient documentation

## 2017-02-25 DIAGNOSIS — M25511 Pain in right shoulder: Secondary | ICD-10-CM | POA: Insufficient documentation

## 2017-02-25 DIAGNOSIS — F17210 Nicotine dependence, cigarettes, uncomplicated: Secondary | ICD-10-CM | POA: Insufficient documentation

## 2017-02-25 DIAGNOSIS — J45909 Unspecified asthma, uncomplicated: Secondary | ICD-10-CM | POA: Insufficient documentation

## 2017-02-25 MED ORDER — IBUPROFEN 800 MG PO TABS: 800.0000 mg | ORAL_TABLET | Freq: Three times a day (TID) | ORAL | 0 refills | Status: DC

## 2017-02-25 MED ORDER — IBUPROFEN 800 MG PO TABS
800.0000 mg | ORAL_TABLET | Freq: Once | ORAL | Status: AC
  Administered 2017-02-25: 800 mg via ORAL
  Filled 2017-02-25: qty 1

## 2017-02-25 MED ORDER — HYDROCODONE-ACETAMINOPHEN 5-325 MG PO TABS: ORAL_TABLET | ORAL | 0 refills | Status: DC

## 2017-02-25 NOTE — ED Provider Notes (Signed)
[facility] EMERGENCY DEPARTMENT Provider Note   CSN: [id number] Arrival date & time: 02/25/17  1046     History   Chief Complaint Chief Complaint  Patient presents with  . Shoulder Pain    HPI Shanitha Twining is a 27 y.o. female.  HPI   Laketra Bowdish is a 27 y.o. female who presents to the Emergency Department complaining of right shoulder for 4 days.  She denies known injury but states she does a lot of reaching on her job.  She describes a constant throbbing pain to to the back of her shoulder that now radiates to the front.  Pain is worse with any attempted movement of her shoulder.  She denies radiating pain, numbness or weakness of the upper extremities, chest pain, shortness of breath, rash or swelling.  She has been taking over-the-counter pain relievers without relief.  Past Medical History:  Diagnosis Date  . Asthma   . Bulging disc   . Chronic back pain   . Chronic pain    Dr. Murray Hodgkins in [place]  . Complication of anesthesia    larynaspasm after extubation following Cholecystectomy  . DDD (degenerative disc disease)   . Depression   . Fibromyalgia   . GERD (gastroesophageal reflux disease)   . History of bronchitis   . Kidney stone   . Numbness and tingling in hands   . Panic attack   . PONV (postoperative nausea and vomiting)    "a little bit of nausea"  . Popping of temporomandibular joint on opening of jaw   . Renal disorder    kidney stones  . Scoliosis   . Spinal bifida, closed   . 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 Vitamin D deficiency 10/06/2015  . Depression with anxiety 10/06/2014  . Encounter for long-term opiate analgesic use 07/28/2014  . Acanthosis nigricans 04/29/2014  . Morbid obesity (HCC) 02/13/2014  . Fibromyalgia 04/03/2012  . Asthma 04/03/2012  . Kidney stones 04/03/2012  . Right anterior knee pain 08/29/2011  . Periumbilical pain 05/29/2010  . NAUSEA AND VOMITING  01/23/2010  . DIARRHEA 01/23/2010  . RUQ PAIN 01/23/2010  . 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 Chronic back pain 03/17/2008    Past Surgical History:  Procedure Laterality Date  . BREATH HYDROGEN TEST  08/08/2010      . CERVICAL CONIZATION W/BX N/A 06/01/2014   Procedure: LASER CONIZATION OF CERVIX;  Surgeon: Lazaro Arms, MD;  Location: [facility] ORS;  Service: Gynecology;  Laterality: N/A;  . CHOLECYSTECTOMY N/A 03/26/2013   Procedure: LAPAROSCOPIC CHOLECYSTECTOMY;  Surgeon: Dalia Heading, MD;  Location: [facility] ORS;  Service: General;  Laterality: N/A;  . COLONOSCOPY  01/2010   scattered petechiae and fibrotic-appearing mucosa of uncertain significance , ?resolving infection, bx benign  . CYSTOSCOPY W/ URETERAL STENT PLACEMENT  05/28/2011   Procedure: CYSTOSCOPY WITH RETROGRADE PYELOGRAM/URETERAL STENT PLACEMENT;  Surgeon: Ky Barban, MD;  Location: [facility] ORS;  Service: Urology;  Laterality: Right;  Cystoscopy with Right Retrograde Pyelogram  . CYSTOSCOPY WITH URETHRAL DILATATION  05/28/2011   Procedure: CYSTOSCOPY WITH URETHRAL DILATATION;  Surgeon: Ky Barban, MD;  Location: [facility] OR[facility];  Service: Urology;  Laterality: N/A;  . ESOPHAGOGASTRODUODENOSCOPY  01/2010   circumferential distal esophageal erosions, hiatal hernia, excoriations/erosion in gastric body ?trauma from vomiting, bx negative and showed chronic gastritis  . ESOPHAGOGASTRODUODENOSCOPY N/A 02/17/2013   Procedure: ESOPHAGOGASTRODUODENOSCOPY (EGD);  Surgeon: Gerrit Friends  Rourk, MD;  Location: [facility] END[facility];  Service: Endoscopy;  Laterality: N/A;  11:30-moved to [address] to notify pt  . HOLMIUM LASER APPLICATION N/A 06/01/2014   Procedure: HOLMIUM LASER APPLICATION;  Surgeon: Lazaro ArmsLuther H Eure, MD;  Location: [facility] ORS;  Service: Gynecology;  Laterality: N/A;  . HYDROGEN BREATH TEST  08/06/2010   Procedure: HYDROGEN BREATH TEST;  Surgeon: Corbin Adeobert M Rourk, MD;  Location: [facility];  Service: Gastroenterology;  Laterality: N/A;  .  MULTIPLE EXTRACTIONS WITH ALVEOLOPLASTY Bilateral 08/04/2015   Procedure: MULTIPLE EXTRACTION;  Surgeon: Ocie DoyneScott Jensen, DDS;  Location: [facility] OR;  Service: Oral Surgery;  Laterality: Bilateral;  . RENAL LITHIASIS REMOVED    . STONE EXTRACTION WITH BASKET  05/28/2011   Procedure: STONE EXTRACTION WITH BASKET;  Surgeon: Ky BarbanMohammad I Javaid, MD;  Location: [facility];  Service: Urology;  Laterality: Right;    OB History    Gravida Para Term Preterm AB Living   1 1 1     1    SAB TAB Ectopic Multiple Live Births                   Home Medications    Prior to Admission medications   Medication Sig Start Date End Date Taking? Authorizing Provider  ALPRAZolam Prudy Feeler(XANAX) 0.5 MG tablet Take 1 tablet (0.5 mg total) by mouth 3 (three) times daily as needed for sleep or anxiety. 04/04/16   Babs SciaraLuking, Scott A, MD  ciprofloxacin (CIPRO) 500 MG tablet Take 1 tablet (500 mg total) by mouth every 12 (twelve) hours. 06/13/16   Burgess AmorIdol, Julie, PA-C  HYDROcodone-acetaminophen (NORCO/VICODIN) 5-325 MG tablet Take 1 tablet by mouth every 4 (four) hours as needed. 06/13/16   Burgess AmorIdol, Julie, PA-C  ibuprofen (ADVIL,MOTRIN) 600 MG tablet Take 1 tablet (600 mg total) by mouth every 8 (eight) hours as needed. Patient not taking: Reported on 04/22/2016 03/12/16   Babs SciaraLuking, Scott A, MD    Family History Family History  Problem Relation Age of Onset  . Multiple sclerosis Father   . Cervical cancer Mother   . Hypertension Mother   . Heart disease Unknown   . Lung disease Unknown   . Asthma Unknown   . Diabetes Unknown   . Kidney disease Unknown   . Anesthesia problems Neg Hx   . Hypotension Neg Hx   . Malignant hyperthermia Neg Hx   . Pseudochol deficiency Neg Hx   . Colon cancer Neg Hx     Social History Social History   Tobacco Use  . Smoking status: Current Every Day Smoker    Packs/day: 1.00    Years: 10.00    Pack years: 10.00    Types: Cigarettes  . Smokeless tobacco: Never Used  Substance Use Topics  . Alcohol use: No     Alcohol/week: 0.0 oz  . Drug use: No     Allergies   Sulfamethoxazole; Cefzil [cefprozil]; Penicillins; Sulfa antibiotics; and Sulfonamide derivatives   Review of Systems Review of Systems  Constitutional: Negative for chills and fever.  Respiratory: Negative for cough, chest tightness and shortness of breath.   Cardiovascular: Negative for chest pain.  Musculoskeletal: Positive for arthralgias (Right shoulder pain). Negative for joint swelling, neck pain and neck stiffness.  Skin: Negative for color change and wound.  Neurological: Negative for dizziness, numbness and headaches.  All other systems reviewed and are negative.    Physical Exam Updated Vital Signs BP (!) 157/97 (BP Location: Left Arm)   Pulse (!) 101  Temp 97.8 F (36.6 C) (Oral)   Resp 16   Ht 5' (1.524 m)   Wt 117.9 kg (260 lb)   LMP 02/17/2017   SpO2 100%   BMI 50.78 kg/m   Physical Exam  Constitutional: She is oriented to person, place, and time. She appears well-developed and well-nourished. No distress.  HENT:  Head: Atraumatic.  Neck: Normal range of motion. Neck supple. No spinous process tenderness present. Normal range of motion present.  Cardiovascular: Normal rate, regular rhythm and intact distal pulses.  No murmur heard. Pulmonary/Chest: Effort normal and breath sounds normal. No respiratory distress.  Musculoskeletal: She exhibits tenderness. She exhibits no edema or deformity.  Diffuse tenderness to palpation of the right shoulder and right trapezius muscle.  Tender at the right [facility] joint.  Limited range of motion of the shoulder due to level of pain.  Full range of motion of the right elbow and right wrist.  Neurological: She is alert and oriented to person, place, and time. No cranial nerve deficit or sensory deficit.  Skin: Skin is warm. Capillary refill takes less than 2 seconds. No rash noted.  Psychiatric: She has a normal mood and affect.  Nursing note and vitals  reviewed.    ED Treatments / Results  Labs (all labs ordered are listed, but only abnormal results are displayed) Labs Reviewed - No data to display  EKG  EKG Interpretation None       Radiology Dg Shoulder Right  Result Date: 02/25/2017 CLINICAL DATA:  Acute onset right shoulder pain 4 days ago. No injury. EXAM: RIGHT SHOULDER - 2+ VIEW COMPARISON:  None. FINDINGS: There is no evidence of fracture or dislocation. There is no evidence of arthropathy or other focal bone abnormality. Soft tissues are unremarkable. IMPRESSION: Negative. Electronically Signed   By: Obie Dredge M.D.   On: 02/25/2017 12:46    Procedures Procedures (including critical care time)  Medications Ordered in ED Medications  ibuprofen (ADVIL,MOTRIN) tablet 800 mg (not administered)     Initial Impression / Assessment and Plan / ED Course  I have reviewed the triage vital signs and the nursing notes.  Pertinent labs & imaging results that were available during my care of the patient were reviewed by me and considered in my medical decision making (see chart for details).     Well-appearing.  Right shoulder pain for several days, likely inflammatory.  X-ray negative for bony findings.  Neurovascularly intact.  Pain is not radicular.  She appears stable for discharge home, agrees to treatment plan with ice, NSAIDs, and short course of pain medication.  Referral information given for local orthopedics at patient's preference.  Final Clinical Impressions(s) / ED Diagnoses   Final diagnoses:  Acute pain of right shoulder    ED Discharge Orders    None       Pauline Aus, PA-C 02/25/17 1300    Donnetta Hutching, MD 02/26/17 [phone]

## 2017-02-25 NOTE — ED Triage Notes (Signed)
Pt c/o R shoulder pain X4 days with unknown injury. Neuro intact. Tylenol 0600 without improvement.

## 2017-02-25 NOTE — ED Notes (Signed)
Patient transported to X-ray 

## 2017-02-25 NOTE — Discharge Instructions (Signed)
Apply ice packs on and off to shoulder.  Follow-up with your primary doctor or with 1 of the orthopedic doctors listed in 1 week if not improving.

## 2017-02-26 ENCOUNTER — Encounter (HOSPITAL_COMMUNITY): Payer: Self-pay | Admitting: Cardiology

## 2017-02-26 ENCOUNTER — Emergency Department (HOSPITAL_COMMUNITY)
Admission: EM | Admit: 2017-02-26 | Discharge: 2017-02-26 | Disposition: A | Discharge status: Home / Self Care | Payer: Self-pay | Attending: Emergency Medicine | Admitting: Emergency Medicine

## 2017-02-26 DIAGNOSIS — F17210 Nicotine dependence, cigarettes, uncomplicated: Secondary | ICD-10-CM | POA: Insufficient documentation

## 2017-02-26 DIAGNOSIS — Z79899 Other long term (current) drug therapy: Secondary | ICD-10-CM | POA: Insufficient documentation

## 2017-02-26 DIAGNOSIS — M25511 Pain in right shoulder: Secondary | ICD-10-CM | POA: Insufficient documentation

## 2017-02-26 DIAGNOSIS — M797 Fibromyalgia: Secondary | ICD-10-CM | POA: Insufficient documentation

## 2017-02-26 DIAGNOSIS — J45909 Unspecified asthma, uncomplicated: Secondary | ICD-10-CM | POA: Insufficient documentation

## 2017-02-26 NOTE — ED Triage Notes (Signed)
Seen here yesterday with right  shoulder pain and diagnosed with tendinitis.  Was told to return if she developed swelling.  Pt states her shoulder is swelling now and she has right arm numbness.

## 2017-02-26 NOTE — Discharge Instructions (Signed)
Your vital signs are within normal limits.  Your shoulder pain is probably related to your tendinitis as well as to your fibromyalgia.  Please continue your Tylenol and ibuprofen.  Please use warm Epson salt tub soaks.  Please see Dr.Luking for management of your fibromyalgia, or for referral to a fibromyalgia clinic.

## 2017-02-26 NOTE — ED Provider Notes (Signed)
[facility] EMERGENCY DEPARTMENT Provider Note   CSN: [id number] Arrival date & time: 02/26/17  1519     History   Chief Complaint Chief Complaint  Patient presents with  . Shoulder Pain    HPI [name] [name] is a 27 y.o. female.  Patient is a 27 year old female who presents to the emergency department with a complaint of right shoulder pain.  The patient states that 5 days ago she began to have increasing problem with her right shoulder.  She states that this is aggravated mostly by doing a lot of reaching and other activities at work.  She describes the pain as being a throb, but then today she noted swelling of her hand and she felt that there was some areas of numbness of her arm.  She was seen in the emergency department on yesterday February 12, at which time she was evaluated, had an x-ray of the shoulder, and was diagnosed with tendinitis.  She now informs me that she also has issues with fibromyalgia.  She has been off of her fibromyalgia medication because she recently got married and they are attempting to get pregnant.  She came to the emergency room because she was concerned about the swelling as well as the episodes of numbness.  She is not dropping objects.  She is able to use her right arm, but states that she is having pain with movements.      Past Medical History:  Diagnosis Date  . Asthma   . Bulging disc   . Chronic back pain   . Chronic pain    Dr. Murray Hodgkins in [place]  . Complication of anesthesia    larynaspasm after extubation following Cholecystectomy  . DDD (degenerative disc disease)   . Depression   . Fibromyalgia   . GERD (gastroesophageal reflux disease)   . History of bronchitis   . Kidney stone   . Numbness and tingling in hands   . Panic attack   . PONV (postoperative nausea and vomiting)    "a little bit of nausea"  . Popping of temporomandibular joint on opening of jaw   . Renal disorder    kidney stones  . Scoliosis   . Spinal bifida,  closed   . 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 Vitamin D deficiency 10/06/2015  . Depression with anxiety 10/06/2014  . Encounter for long-term opiate analgesic use 07/28/2014  . Acanthosis nigricans 04/29/2014  . Morbid obesity (HCC) 02/13/2014  . Fibromyalgia 04/03/2012  . Asthma 04/03/2012  . Kidney stones 04/03/2012  . Right anterior knee pain 08/29/2011  . Periumbilical pain 05/29/2010  . NAUSEA AND VOMITING 01/23/2010  . DIARRHEA 01/23/2010  . RUQ PAIN 01/23/2010  . 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 Chronic back pain 03/17/2008    Past Surgical History:  Procedure Laterality Date  . BREATH HYDROGEN TEST  08/08/2010      . CERVICAL CONIZATION W/BX N/A 06/01/2014   Procedure: LASER CONIZATION OF CERVIX;  Surgeon: Lazaro Arms, MD;  Location: [facility] ORS;  Service: Gynecology;  Laterality: N/A;  . CHOLECYSTECTOMY N/A 03/26/2013   Procedure: LAPAROSCOPIC CHOLECYSTECTOMY;  Surgeon: Dalia Heading, MD;  Location: [facility] ORS;  Service: General;  Laterality: N/A;  . COLONOSCOPY  01/2010   scattered petechiae and fibrotic-appearing mucosa of uncertain significance , ?resolving infection, bx benign  . CYSTOSCOPY W/ URETERAL STENT PLACEMENT  05/28/2011   Procedure: CYSTOSCOPY WITH  RETROGRADE PYELOGRAM/URETERAL STENT PLACEMENT;  Surgeon: Ky Barban, MD;  Location: [facility] OR[facility];  Service: Urology;  Laterality: Right;  Cystoscopy with Right Retrograde Pyelogram  . CYSTOSCOPY WITH URETHRAL DILATATION  05/28/2011   Procedure: CYSTOSCOPY WITH URETHRAL DILATATION;  Surgeon: Ky Barban, MD;  Location: [facility] OR[facility];  Service: Urology;  Laterality: N/A;  . ESOPHAGOGASTRODUODENOSCOPY  01/2010   circumferential distal esophageal erosions, hiatal hernia, excoriations/erosion in gastric body ?trauma from vomiting, bx negative and showed chronic gastritis  . ESOPHAGOGASTRODUODENOSCOPY N/A 02/17/2013   Procedure:  ESOPHAGOGASTRODUODENOSCOPY (EGD);  Surgeon: Corbin Ade, MD;  Location: [facility];  Service: Endoscopy;  Laterality: N/A;  11:30-moved to [address] to notify pt  . HOLMIUM LASER APPLICATION N/A 06/01/2014   Procedure: HOLMIUM LASER APPLICATION;  Surgeon: Lazaro Arms, MD;  Location: [facility] ORS;  Service: Gynecology;  Laterality: N/A;  . HYDROGEN BREATH TEST  08/06/2010   Procedure: HYDROGEN BREATH TEST;  Surgeon: Corbin Ade, MD;  Location: [facility];  Service: Gastroenterology;  Laterality: N/A;  . MULTIPLE EXTRACTIONS WITH ALVEOLOPLASTY Bilateral 08/04/2015   Procedure: MULTIPLE EXTRACTION;  Surgeon: Ocie Doyne, DDS;  Location: [facility] OR;  Service: Oral Surgery;  Laterality: Bilateral;  . RENAL LITHIASIS REMOVED    . STONE EXTRACTION WITH BASKET  05/28/2011   Procedure: STONE EXTRACTION WITH BASKET;  Surgeon: Ky Barban, MD;  Location: [facility];  Service: Urology;  Laterality: Right;    OB History    Gravida Para Term Preterm AB Living   1 1 1     1    SAB TAB Ectopic Multiple Live Births                   Home Medications    Prior to Admission medications   Medication Sig Start Date End Date Taking? Authorizing Provider  ALPRAZolam Prudy Feeler) 0.5 MG tablet Take 1 tablet (0.5 mg total) by mouth 3 (three) times daily as needed for sleep or anxiety. 04/04/16   Babs Sciara, MD  ciprofloxacin (CIPRO) 500 MG tablet Take 1 tablet (500 mg total) by mouth every 12 (twelve) hours. 06/13/16   Burgess Amor, PA-C  HYDROcodone-acetaminophen (NORCO/VICODIN) 5-325 MG tablet Take one tab po q 4 hrs prn pain 02/25/17   Triplett, Tammy, PA-C  ibuprofen (ADVIL,MOTRIN) 800 MG tablet Take 1 tablet (800 mg total) by mouth 3 (three) times daily. 02/25/17   Pauline Aus, PA-C    Family History Family History  Problem Relation Age of Onset  . Multiple sclerosis Father   . Cervical cancer Mother   . Hypertension Mother   . Heart disease Unknown   . Lung disease Unknown   . Asthma Unknown   .  Diabetes Unknown   . Kidney disease Unknown   . Anesthesia problems Neg Hx   . Hypotension Neg Hx   . Malignant hyperthermia Neg Hx   . Pseudochol deficiency Neg Hx   . Colon cancer Neg Hx     Social History Social History   Tobacco Use  . Smoking status: Current Every Day Smoker    Packs/day: 1.00    Years: 10.00    Pack years: 10.00    Types: Cigarettes  . Smokeless tobacco: Never Used  Substance Use Topics  . Alcohol use: No    Alcohol/week: 0.0 oz  . Drug use: No     Allergies   Sulfamethoxazole; Cefzil [cefprozil]; Penicillins; Sulfa antibiotics; and Sulfonamide derivatives   Review of Systems Review of Systems  Constitutional:  Negative for activity change.       All ROS Neg except as noted in HPI  HENT: Negative for nosebleeds.   Eyes: Negative for photophobia and discharge.  Respiratory: Negative for cough, shortness of breath and wheezing.   Cardiovascular: Negative for chest pain and palpitations.  Gastrointestinal: Negative for abdominal pain and blood in stool.  Genitourinary: Negative for dysuria, frequency and hematuria.  Musculoskeletal: Positive for arthralgias. Negative for back pain and neck pain.  Skin: Negative.   Neurological: Positive for numbness. Negative for dizziness, seizures and speech difficulty.  Psychiatric/Behavioral: Negative for confusion and hallucinations.     Physical Exam Updated Vital Signs BP 116/86   Pulse (!) 105   Temp 98.4 F (36.9 C) (Oral)   Resp 18   Ht 5' (1.524 m)   Wt 117.9 kg (260 lb)   LMP 02/17/2017   SpO2 100%   BMI 50.78 kg/m   Physical Exam  Constitutional: She is oriented to person, place, and time. She appears well-developed and well-nourished.  Non-toxic appearance.  HENT:  Head: Normocephalic.  Right Ear: Tympanic membrane and external ear normal.  Left Ear: Tympanic membrane and external ear normal.  Eyes: EOM and lids are normal. Pupils are equal, round, and reactive to light.  Neck:  Normal range of motion. Neck supple. Carotid bruit is not present.  Cardiovascular: Normal rate, regular rhythm, normal heart sounds, intact distal pulses and normal pulses.  Pulmonary/Chest: Breath sounds normal. No respiratory distress.  Abdominal: Soft. Bowel sounds are normal. There is no tenderness. There is no guarding.  Musculoskeletal: Normal range of motion.       Cervical back: She exhibits tenderness.  Patient has some mild to moderate tenderness of the cervical spine and the paraspinal muscles of the right cervical area.  The patient has pain with range of motion of the right shoulder, but can operate the right shoulder and in circles.  She has pain in the upper trapezius area.  There is also pain noted to palpation in the [facility] joint area.  There are no hot areas appreciated.  There is no evidence for dislocation.  The patient has full range of motion of the right elbow, wrist, and fingers.  No swelling noted of the right hand at this time.  There is no abnormality of the thenar eminence.  Lymphadenopathy:       Head (right side): No submandibular adenopathy present.       Head (left side): No submandibular adenopathy present.    She has no cervical adenopathy.  Neurological: She is alert and oriented to person, place, and time. She has normal strength. No cranial nerve deficit or sensory deficit.  Grip is symmetrical, but grip causes pain to the patient on the right.  There is no weakness to flexion and extension of the right wrist.  There is no weakness to flexion and extension of the right elbow.  The patient has one area of the tricep area that she says her sensory is not is good as the other areas of her arm.  Skin: Skin is warm and dry.  Psychiatric: She has a normal mood and affect. Her speech is normal.  Nursing note and vitals reviewed.    ED Treatments / Results  Labs (all labs ordered are listed, but only abnormal results are displayed) Labs Reviewed - No data to  display  EKG  EKG Interpretation None       Radiology Dg Shoulder Right  Result Date: 02/25/2017 CLINICAL  DATA:  Acute onset right shoulder pain 4 days ago. No injury. EXAM: RIGHT SHOULDER - 2+ VIEW COMPARISON:  None. FINDINGS: There is no evidence of fracture or dislocation. There is no evidence of arthropathy or other focal bone abnormality. Soft tissues are unremarkable. IMPRESSION: Negative. Electronically Signed   By: Obie Dredge M.D.   On: 02/25/2017 12:46    Procedures Procedures (including critical care time)  Medications Ordered in ED Medications - No data to display   Initial Impression / Assessment and Plan / ED Course  I have reviewed the triage vital signs and the nursing notes.  Pertinent labs & imaging results that were available during my care of the patient were reviewed by me and considered in my medical decision making (see chart for details).       Final Clinical Impressions(s) / ED Diagnoses MDM  Vital signs are well within normal limits.  No gross neurologic or vascular deficit appreciated on examination.  The examination suggest an exacerbation of the fibromyalgia, possibly an exacerbation of the tendinitis previously diagnosed.  The patient is not taking any of her fibromyalgia medications because she recently married and wants to get pregnant.  I have asked the patient to continue conservative measures, and to also add warm Epson salt tub soaks.  I further asked her to see Dr.Luking for additional evaluation of her fibromyalgia, and/or referral to a fibromyalgia clinic to assist her with her management.  Patient is in agreement with this plan.   Final diagnoses:  Acute pain of right shoulder  Fibromyalgia    ED Discharge Orders    None       Ivery Quale, PA-C 02/26/17 1700    Doug Sou, MD 02/26/17 2328

## 2017-05-03 ENCOUNTER — Encounter (HOSPITAL_COMMUNITY): Payer: Self-pay | Admitting: Emergency Medicine

## 2017-05-03 ENCOUNTER — Observation Stay (HOSPITAL_COMMUNITY)
Admission: EM | Admit: 2017-05-03 | Discharge: 2017-05-04 | Disposition: A | Discharge status: Home / Self Care | Payer: Self-pay | Attending: Urology | Admitting: Urology

## 2017-05-03 ENCOUNTER — Emergency Department (HOSPITAL_COMMUNITY): Payer: Self-pay | Admitting: Registered Nurse

## 2017-05-03 ENCOUNTER — Other Ambulatory Visit: Payer: Self-pay

## 2017-05-03 ENCOUNTER — Encounter (HOSPITAL_COMMUNITY)
Admission: EM | Disposition: A | Discharge status: Home / Self Care | Payer: Self-pay | Source: Home / Self Care | Attending: Emergency Medicine

## 2017-05-03 ENCOUNTER — Emergency Department (HOSPITAL_COMMUNITY): Payer: Self-pay

## 2017-05-03 DIAGNOSIS — Z87442 Personal history of urinary calculi: Secondary | ICD-10-CM | POA: Insufficient documentation

## 2017-05-03 DIAGNOSIS — M797 Fibromyalgia: Secondary | ICD-10-CM | POA: Insufficient documentation

## 2017-05-03 DIAGNOSIS — F329 Major depressive disorder, single episode, unspecified: Secondary | ICD-10-CM | POA: Insufficient documentation

## 2017-05-03 DIAGNOSIS — N200 Calculus of kidney: Secondary | ICD-10-CM

## 2017-05-03 DIAGNOSIS — Z880 Allergy status to penicillin: Secondary | ICD-10-CM | POA: Insufficient documentation

## 2017-05-03 DIAGNOSIS — Q059 Spina bifida, unspecified: Secondary | ICD-10-CM | POA: Insufficient documentation

## 2017-05-03 DIAGNOSIS — Z9049 Acquired absence of other specified parts of digestive tract: Secondary | ICD-10-CM | POA: Insufficient documentation

## 2017-05-03 DIAGNOSIS — Z881 Allergy status to other antibiotic agents status: Secondary | ICD-10-CM | POA: Insufficient documentation

## 2017-05-03 DIAGNOSIS — F17210 Nicotine dependence, cigarettes, uncomplicated: Secondary | ICD-10-CM | POA: Insufficient documentation

## 2017-05-03 DIAGNOSIS — N136 Pyonephrosis: Principal | ICD-10-CM | POA: Insufficient documentation

## 2017-05-03 DIAGNOSIS — Z79899 Other long term (current) drug therapy: Secondary | ICD-10-CM | POA: Insufficient documentation

## 2017-05-03 DIAGNOSIS — K219 Gastro-esophageal reflux disease without esophagitis: Secondary | ICD-10-CM | POA: Insufficient documentation

## 2017-05-03 DIAGNOSIS — F410 Panic disorder [episodic paroxysmal anxiety] without agoraphobia: Secondary | ICD-10-CM | POA: Insufficient documentation

## 2017-05-03 DIAGNOSIS — N201 Calculus of ureter: Secondary | ICD-10-CM

## 2017-05-03 DIAGNOSIS — Z882 Allergy status to sulfonamides status: Secondary | ICD-10-CM | POA: Insufficient documentation

## 2017-05-03 DIAGNOSIS — R8281 Pyuria: Secondary | ICD-10-CM

## 2017-05-03 DIAGNOSIS — N138 Other obstructive and reflux uropathy: Secondary | ICD-10-CM

## 2017-05-03 DIAGNOSIS — M419 Scoliosis, unspecified: Secondary | ICD-10-CM | POA: Insufficient documentation

## 2017-05-03 HISTORY — PX: CYSTOSCOPY WITH STENT PLACEMENT: SHX5790

## 2017-05-03 LAB — COMPREHENSIVE METABOLIC PANEL
ALK PHOS: 96 U/L (ref 38–126)
ALT: 15 U/L (ref 14–54)
AST: 14 U/L — AB (ref 15–41)
Albumin: 3.6 g/dL (ref 3.5–5.0)
Anion gap: 11 (ref 5–15)
BILIRUBIN TOTAL: 0.6 mg/dL (ref 0.3–1.2)
BUN: 13 mg/dL (ref 6–20)
CALCIUM: 9 mg/dL (ref 8.9–10.3)
CO2: 27 mmol/L (ref 22–32)
CREATININE: 0.89 mg/dL (ref 0.44–1.00)
Chloride: 99 mmol/L — ABNORMAL LOW (ref 101–111)
GFR calc Af Amer: >60 mL/min (ref >60)
GFR calc non Af Amer: >60 mL/min (ref >60)
GLUCOSE: 136 mg/dL — AB (ref 65–99)
Potassium: 3.9 mmol/L (ref 3.5–5.1)
Sodium: 137 mmol/L (ref 135–145)
Total Protein: 7.4 g/dL (ref 6.5–8.1)

## 2017-05-03 LAB — URINALYSIS, ROUTINE W REFLEX MICROSCOPIC
Bilirubin Urine: NEGATIVE
GLUCOSE, UA: NEGATIVE mg/dL
KETONES UR: NEGATIVE mg/dL
Nitrite: POSITIVE — AB
PH: 5 (ref 5.0–8.0)
Protein, ur: NEGATIVE mg/dL
SPECIFIC GRAVITY, URINE: 1.02 (ref 1.005–1.030)

## 2017-05-03 LAB — CBC WITH DIFFERENTIAL/PLATELET
BASOS PCT: 0 %
Basophils Absolute: 0 10*3/uL (ref 0.0–0.1)
EOS ABS: 0.5 10*3/uL (ref 0.0–0.7)
EOS PCT: 4 %
HEMATOCRIT: 42.2 % (ref 36.0–46.0)
Hemoglobin: 13.7 g/dL (ref 12.0–15.0)
Lymphocytes Relative: 14 %
Lymphs Abs: 1.9 10*3/uL (ref 0.7–4.0)
MCH: 28 pg (ref 26.0–34.0)
MCHC: 32.5 g/dL (ref 30.0–36.0)
MCV: 86.3 fL (ref 78.0–100.0)
MONO ABS: 0.8 10*3/uL (ref 0.1–1.0)
MONOS PCT: 6 %
NEUTROS ABS: 10.3 10*3/uL — AB (ref 1.7–7.7)
Neutrophils Relative %: 76 %
PLATELETS: 326 10*3/uL (ref 150–400)
RBC: 4.89 MIL/uL (ref 3.87–5.11)
RDW: 13.3 % (ref 11.5–15.5)
WBC: 13.6 10*3/uL — ABNORMAL HIGH (ref 4.0–10.5)

## 2017-05-03 LAB — PREGNANCY, URINE: Preg Test, Ur: NEGATIVE

## 2017-05-03 SURGERY — CYSTOSCOPY, WITH STENT INSERTION
Anesthesia: General | Site: Ureter | Laterality: Left

## 2017-05-03 MED ORDER — HYDROMORPHONE HCL 1 MG/ML IJ SOLN
0.5000 mg | INTRAMUSCULAR | Status: DC | PRN
  Administered 2017-05-03 – 2017-05-04 (×3): 0.5 mg via INTRAVENOUS
  Filled 2017-05-03 (×4): qty 1

## 2017-05-03 MED ORDER — BELLADONNA ALKALOIDS-OPIUM 16.2-60 MG RE SUPP
1.0000 | Freq: Four times a day (QID) | RECTAL | Status: DC | PRN
  Filled 2017-05-03: qty 1

## 2017-05-03 MED ORDER — DEXAMETHASONE SODIUM PHOSPHATE 10 MG/ML IJ SOLN
INTRAMUSCULAR | Status: AC
  Filled 2017-05-03: qty 1

## 2017-05-03 MED ORDER — DEXAMETHASONE SODIUM PHOSPHATE 10 MG/ML IJ SOLN
INTRAMUSCULAR | Status: DC | PRN
  Administered 2017-05-03: 10 mg via INTRAVENOUS

## 2017-05-03 MED ORDER — NALOXONE HCL 0.4 MG/ML IJ SOLN
INTRAMUSCULAR | Status: DC | PRN
  Administered 2017-05-03: 80 ug via INTRAVENOUS
  Administered 2017-05-03: 120 ug via INTRAVENOUS

## 2017-05-03 MED ORDER — PROPOFOL 10 MG/ML IV BOLUS
INTRAVENOUS | Status: AC
  Filled 2017-05-03: qty 20

## 2017-05-03 MED ORDER — DIPHENHYDRAMINE HCL 12.5 MG/5ML PO ELIX: 12.5000 mg | ORAL_SOLUTION | Freq: Four times a day (QID) | ORAL | Status: DC | PRN

## 2017-05-03 MED ORDER — SUCCINYLCHOLINE CHLORIDE 20 MG/ML IJ SOLN
INTRAMUSCULAR | Status: DC | PRN
  Administered 2017-05-03: 40 mg via INTRAVENOUS
  Administered 2017-05-03: 160 mg via INTRAVENOUS

## 2017-05-03 MED ORDER — PNEUMOCOCCAL VAC POLYVALENT 25 MCG/0.5ML IJ INJ
0.5000 mL | INJECTION | INTRAMUSCULAR | Status: DC
  Filled 2017-05-03: qty 0.5

## 2017-05-03 MED ORDER — HYDROMORPHONE HCL 1 MG/ML IJ SOLN
1.0000 mg | Freq: Once | INTRAMUSCULAR | Status: AC
  Administered 2017-05-03: 1 mg via INTRAVENOUS
  Filled 2017-05-03: qty 1

## 2017-05-03 MED ORDER — MEPERIDINE HCL 50 MG/ML IJ SOLN: 6.2500 mg | INTRAMUSCULAR | Status: DC | PRN

## 2017-05-03 MED ORDER — HYDROMORPHONE HCL 1 MG/ML IJ SOLN
0.5000 mg | Freq: Once | INTRAMUSCULAR | Status: AC
  Administered 2017-05-03: 0.5 mg via INTRAVENOUS
  Filled 2017-05-03: qty 1

## 2017-05-03 MED ORDER — HYDROCODONE-ACETAMINOPHEN 5-325 MG PO TABS
1.0000 | ORAL_TABLET | ORAL | Status: DC | PRN
  Administered 2017-05-04 (×2): 2 via ORAL
  Filled 2017-05-03 (×2): qty 2

## 2017-05-03 MED ORDER — SODIUM CHLORIDE 0.9 % IV SOLN
1.0000 g | Freq: Once | INTRAVENOUS | Status: AC
  Administered 2017-05-03: 1 g via INTRAVENOUS
  Filled 2017-05-03: qty 10

## 2017-05-03 MED ORDER — KETOROLAC TROMETHAMINE 30 MG/ML IJ SOLN
15.0000 mg | Freq: Once | INTRAMUSCULAR | Status: DC
Start: 2017-05-03 — End: 2017-05-04

## 2017-05-03 MED ORDER — CIPROFLOXACIN HCL 500 MG PO TABS
500.0000 mg | ORAL_TABLET | Freq: Two times a day (BID) | ORAL | Status: DC
  Administered 2017-05-03 – 2017-05-04 (×2): 500 mg via ORAL
  Filled 2017-05-03 (×2): qty 1

## 2017-05-03 MED ORDER — SODIUM CHLORIDE 0.9 % IR SOLN
Status: DC | PRN
  Administered 2017-05-03: 3000 mL

## 2017-05-03 MED ORDER — SODIUM CHLORIDE 0.9 % IV BOLUS
1000.0000 mL | Freq: Once | INTRAVENOUS | Status: AC
  Administered 2017-05-03: 1000 mL via INTRAVENOUS

## 2017-05-03 MED ORDER — PROPOFOL 10 MG/ML IV BOLUS
INTRAVENOUS | Status: DC | PRN
  Administered 2017-05-03: 260 mg via INTRAVENOUS
  Administered 2017-05-03: 50 mg via INTRAVENOUS

## 2017-05-03 MED ORDER — CIPROFLOXACIN IN D5W 400 MG/200ML IV SOLN
400.0000 mg | Freq: Once | INTRAVENOUS | Status: AC
  Administered 2017-05-03: 400 mg via INTRAVENOUS
  Filled 2017-05-03: qty 200

## 2017-05-03 MED ORDER — OXYBUTYNIN CHLORIDE 5 MG PO TABS: 5.0000 mg | ORAL_TABLET | Freq: Three times a day (TID) | ORAL | Status: DC | PRN

## 2017-05-03 MED ORDER — MIDAZOLAM HCL 2 MG/2ML IJ SOLN: 0.5000 mg | Freq: Once | INTRAMUSCULAR | Status: DC | PRN

## 2017-05-03 MED ORDER — SODIUM CHLORIDE 0.9 % IV SOLN
INTRAVENOUS | Status: DC | PRN
  Administered 2017-05-03 (×2): via INTRAVENOUS

## 2017-05-03 MED ORDER — MIDAZOLAM HCL 2 MG/2ML IJ SOLN
INTRAMUSCULAR | Status: AC
Start: 2017-05-03 — End: 2017-05-03
  Filled 2017-05-03: qty 2

## 2017-05-03 MED ORDER — DIPHENHYDRAMINE HCL 50 MG/ML IJ SOLN: 12.5000 mg | Freq: Four times a day (QID) | INTRAMUSCULAR | Status: DC | PRN

## 2017-05-03 MED ORDER — ALBUTEROL SULFATE (2.5 MG/3ML) 0.083% IN NEBU
INHALATION_SOLUTION | RESPIRATORY_TRACT | Status: AC
  Filled 2017-05-03: qty 3

## 2017-05-03 MED ORDER — FENTANYL CITRATE (PF) 250 MCG/5ML IJ SOLN
INTRAMUSCULAR | Status: AC
  Filled 2017-05-03: qty 5

## 2017-05-03 MED ORDER — MIDAZOLAM HCL 5 MG/5ML IJ SOLN
INTRAMUSCULAR | Status: DC | PRN
  Administered 2017-05-03: 1 mg via INTRAVENOUS

## 2017-05-03 MED ORDER — FENTANYL CITRATE (PF) 100 MCG/2ML IJ SOLN
INTRAMUSCULAR | Status: DC | PRN
  Administered 2017-05-03: 25 ug via INTRAVENOUS
  Administered 2017-05-03: 100 ug via INTRAVENOUS

## 2017-05-03 MED ORDER — PROMETHAZINE HCL 25 MG/ML IJ SOLN: 6.2500 mg | INTRAMUSCULAR | Status: DC | PRN

## 2017-05-03 MED ORDER — ALBUTEROL SULFATE HFA 108 (90 BASE) MCG/ACT IN AERS
INHALATION_SPRAY | RESPIRATORY_TRACT | Status: DC | PRN
  Administered 2017-05-03: 6 via RESPIRATORY_TRACT

## 2017-05-03 MED ORDER — LIDOCAINE HCL (CARDIAC) PF 50 MG/5ML IV SOSY
PREFILLED_SYRINGE | INTRAVENOUS | Status: DC | PRN
  Administered 2017-05-03: 75 mg via INTRAVENOUS
  Administered 2017-05-03: 25 mg via INTRAVENOUS

## 2017-05-03 MED ORDER — ALBUTEROL SULFATE (2.5 MG/3ML) 0.083% IN NEBU
2.5000 mg | INHALATION_SOLUTION | Freq: Once | RESPIRATORY_TRACT | Status: AC
  Administered 2017-05-03: 2.5 mg via RESPIRATORY_TRACT

## 2017-05-03 MED ORDER — SODIUM CHLORIDE 0.9 % IV SOLN
INTRAVENOUS | Status: DC
  Administered 2017-05-03: 19:00:00 via INTRAVENOUS

## 2017-05-03 MED ORDER — MORPHINE SULFATE (PF) 4 MG/ML IV SOLN
4.0000 mg | Freq: Once | INTRAVENOUS | Status: AC
  Administered 2017-05-03: 4 mg via INTRAVENOUS
  Filled 2017-05-03: qty 1

## 2017-05-03 MED ORDER — ONDANSETRON HCL 4 MG/2ML IJ SOLN: 4.0000 mg | INTRAMUSCULAR | Status: DC | PRN

## 2017-05-03 MED ORDER — ONDANSETRON HCL 4 MG/2ML IJ SOLN
INTRAMUSCULAR | Status: AC
  Filled 2017-05-03: qty 2

## 2017-05-03 MED ORDER — HYDROMORPHONE HCL 2 MG/ML IJ SOLN
2.0000 mg | Freq: Once | INTRAMUSCULAR | Status: AC
  Administered 2017-05-03: 2 mg via INTRAVENOUS
  Filled 2017-05-03: qty 1

## 2017-05-03 MED ORDER — HYDROMORPHONE HCL 1 MG/ML IJ SOLN: 0.2500 mg | INTRAMUSCULAR | Status: DC | PRN

## 2017-05-03 MED ORDER — 0.9 % SODIUM CHLORIDE (POUR BTL) OPTIME
TOPICAL | Status: DC | PRN
  Administered 2017-05-03: 1000 mL

## 2017-05-03 MED ORDER — ONDANSETRON HCL 4 MG/2ML IJ SOLN
INTRAMUSCULAR | Status: DC | PRN
  Administered 2017-05-03: 4 mg via INTRAVENOUS

## 2017-05-03 MED ORDER — ONDANSETRON HCL 4 MG/2ML IJ SOLN
4.0000 mg | Freq: Once | INTRAMUSCULAR | Status: AC
  Administered 2017-05-03: 4 mg via INTRAVENOUS
  Filled 2017-05-03: qty 2

## 2017-05-03 SURGICAL SUPPLY — 14 items
BAG URO CATCHER STRL LF (MISCELLANEOUS) ×3 IMPLANT
CATH URET 5FR 28IN OPEN ENDED (CATHETERS) ×3 IMPLANT
CLOTH BEACON ORANGE TIMEOUT ST (SAFETY) ×3 IMPLANT
COVER FOOTSWITCH UNIV (MISCELLANEOUS) IMPLANT
COVER SURGICAL LIGHT HANDLE (MISCELLANEOUS) ×1 IMPLANT
GLOVE BIOGEL M STRL SZ7.5 (GLOVE) ×3 IMPLANT
GOWN STRL REUS W/TWL LRG LVL3 (GOWN DISPOSABLE) ×6 IMPLANT
GUIDEWIRE STR DUAL SENSOR (WIRE) ×3 IMPLANT
MANIFOLD NEPTUNE II (INSTRUMENTS) ×3 IMPLANT
PACK CYSTO (CUSTOM PROCEDURE TRAY) ×3 IMPLANT
STENT URET 6FRX24 CONTOUR (STENTS) ×2 IMPLANT
TRAY FOLEY W/METER SILVER 16FR (SET/KITS/TRAYS/PACK) ×2 IMPLANT
TUBING CONNECTING 10 (TUBING) ×2 IMPLANT
TUBING CONNECTING 10' (TUBING) ×1

## 2017-05-03 NOTE — Anesthesia Procedure Notes (Signed)
Procedure Name: Intubation Date/Time: 05/03/2017 4:32 PM Performed by: Lissa Morales, CRNA Pre-anesthesia Checklist: Patient identified, Emergency Drugs available, Suction available and Patient being monitored Patient Re-evaluated:Patient Re-evaluated prior to induction Oxygen Delivery Method: Circle system utilized Preoxygenation: Pre-oxygenation with 100% oxygen Induction Type: IV induction and Rapid sequence Laryngoscope Size: Glidescope, Mac and 4 Grade View: Grade III Tube type: Oral Tube size: 7.0 mm Number of attempts: 1 Airway Equipment and Method: Stylet and Oral airway Placement Confirmation: ETT inserted through vocal cords under direct vision,  positive ETCO2 and breath sounds checked- equal and bilateral Secured at: 21 cm Tube secured with: Tape Dental Injury: Teeth and Oropharynx as per pre-operative assessment  Difficulty Due To: Difficulty was anticipated, Difficult Airway- due to limited oral opening, Difficult Airway- due to dentition and Difficult Airway- due to reduced neck mobility Comments: Elective  glidescope MAP3

## 2017-05-03 NOTE — ED Triage Notes (Signed)
Pt reports left flank pain x1 hour ago. Pt reports urinary frequency with minimal output for last several days. Pt reports LMP x2 weeks ago. Pt denies n/v/fever.

## 2017-05-03 NOTE — ED Notes (Signed)
Pt ambulated to BR

## 2017-05-03 NOTE — ED Notes (Signed)
Pt continually crying and restless in bed. Pain suddenly started approx 0730 to left side

## 2017-05-03 NOTE — Op Note (Signed)
Operative Note  Preoperative diagnosis:  1.  12 mm left UPJ stone 2.  Urinary tract infection  Postoperative diagnosis: 1.  Same  Procedure(s): 1.  Cystoscopy with left JJ stent placement  Surgeon: Rhoderick Moodyhristopher Karman Biswell, MD  Assistants: None  Anesthesia: General LMA  Complications: None  EBL: Less than 5 mL  Specimens: 1.  Urine for culture and sensitivity   Drains/Catheters: 1.  Left 6 French by 24 cm JJ stent without tether 2.  16 French Foley catheter  Intraoperative findings:   1. Purulent urine was expressed from the left ureteral orifice following stent placement  Indication:  [name] [name] is a 27 y.o. female with acute onset of left-sided flank pain this morning.  She had a CT that demonstrated an obstructing 12 mm left UPJ stone and evidence of a urinary tract infection.  She has been consented for the above procedures, voices understanding and wishes to proceed  Description of procedure:  After informed consent was obtained, the patient was brought to the operating room and general LMA anesthesia was administered. The patient was then placed in the dorsolithotomy position and prepped and draped in usual sterile fashion. A timeout was performed. A 23 French rigid cystoscope was then inserted into the urethral meatus and advanced into the bladder under direct vision. A complete bladder survey revealed no intravesical pathology.  A Glidewire was then advanced up the left ureter and into the left renal pelvis, under fluoroscopic guidance.  A 6 French by 24 cm JJ stent was then advanced over the wire and into position within the left collecting system, confirming placement via fluoroscopy.  There was purulent urine expressed from the left ureteral orifice once the obstructing stone was bypassed.  A urine culture was sent.  The rigid scope was then removed.  A 16 French Foley catheter was then placed and set to gravity drainage.  The patient tolerated the procedure well and was  transferred to the postanesthesia in stable condition.  Plan: Monitor the patient overnight for pain control.  She will need definitive stone treatment in the next 1 to 2 weeks.

## 2017-05-03 NOTE — ED Notes (Signed)
Pt advised she has to remain NPO until after sx

## 2017-05-03 NOTE — H&P (Signed)
H&P  Chief Complaint: Left flank pain  History of Present Illness: [name] [name] is a 27 y.o. year old female who presented to the [facility] emergency department at 7 AM this morning with acute onset of left-sided flank pain.  She describes the pain as sharp, intermittent and radiates down to her left inguinal region.  She denies subjective fever/chills, nausea/vomiting or hematuria.  She does have a history of nephrolithiasis and required ureteroscopy 4 to 5 years ago.  She had a CT stone study today that demonstrated a 5 x 12 mm left UPJ stone with moderate hydronephrosis and urinalysis concerning for possible urinary tract infection.  Currently, her pain is controlled after receiving Dilaudid and morphine in the emergency department.  Past Medical History:  Diagnosis Date  . Asthma   . Bulging disc   . Chronic back pain   . 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 Complication of anesthesia    larynaspasm after extubation following Cholecystectomy  . DDD (degenerative disc disease)   . Depression   . Fibromyalgia   . GERD (gastroesophageal reflux disease)   . History of bronchitis   . Kidney stone   . Numbness and tingling in hands   . Panic attack   . PONV (postoperative nausea and vomiting)    "a little bit of nausea"  . Popping of temporomandibular joint on opening of jaw   . Renal disorder    kidney stones  . Scoliosis   . Spinal bifida, closed   . 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 BREATH HYDROGEN TEST  08/08/2010      . CERVICAL CONIZATION W/BX N/A 06/01/2014   Procedure: LASER CONIZATION OF CERVIX;  Surgeon: Lazaro Arms, MD;  Location: [facility];  Service: Gynecology;  Laterality: N/A;  . CHOLECYSTECTOMY N/A 03/26/2013   Procedure: LAPAROSCOPIC CHOLECYSTECTOMY;  Surgeon: Dalia Heading, MD;  Location: [facility];  Service: General;  Laterality: N/A;  . COLONOSCOPY  01/2010   scattered petechiae and fibrotic-appearing  mucosa of uncertain significance , ?resolving infection, bx benign  . CYSTOSCOPY W/ URETERAL STENT PLACEMENT  05/28/2011   Procedure: CYSTOSCOPY WITH RETROGRADE PYELOGRAM/URETERAL STENT PLACEMENT;  Surgeon: Ky Barban, MD;  Location: [facility];  Service: Urology;  Laterality: Right;  Cystoscopy with Right Retrograde Pyelogram  . CYSTOSCOPY WITH URETHRAL DILATATION  05/28/2011   Procedure: CYSTOSCOPY WITH URETHRAL DILATATION;  Surgeon: Ky Barban, MD;  Location: [facility];  Service: Urology;  Laterality: N/A;  . ESOPHAGOGASTRODUODENOSCOPY  01/2010   circumferential distal esophageal erosions, hiatal hernia, excoriations/erosion in gastric body ?trauma from vomiting, bx negative and showed chronic gastritis  . ESOPHAGOGASTRODUODENOSCOPY N/A 02/17/2013   Procedure: ESOPHAGOGASTRODUODENOSCOPY (EGD);  Surgeon: Corbin Ade, MD;  Location: [facility];  Service: Endoscopy;  Laterality: N/A;  11:30-moved to [address] to notify pt  . HOLMIUM LASER APPLICATION N/A 06/01/2014   Procedure: HOLMIUM LASER APPLICATION;  Surgeon: Lazaro Arms, MD;  Location: [facility];  Service: Gynecology;  Laterality: N/A;  . HYDROGEN BREATH TEST  08/06/2010   Procedure: HYDROGEN BREATH TEST;  Surgeon: Corbin Ade, MD;  Location: [facility];  Service: Gastroenterology;  Laterality: N/A;  . MULTIPLE EXTRACTIONS WITH ALVEOLOPLASTY Bilateral 08/04/2015   Procedure: MULTIPLE EXTRACTION;  Surgeon: Ocie Doyne, DDS;  Location: [facility];  Service: Oral Surgery;  Laterality: Bilateral;  . RENAL LITHIASIS REMOVED    . STONE EXTRACTION  WITH BASKET  05/28/2011   Procedure: STONE EXTRACTION WITH BASKET;  Surgeon: Ky Barban, MD;  Location: [facility];  Service: Urology;  Laterality: Right;    Home Medications:  No outpatient medications have been marked as taking for the 05/03/17 encounter [facility] Encounter).    Allergies:  Allergies  Allergen Reactions  . Sulfamethoxazole Anaphylaxis  . Cefzil [Cefprozil] Nausea And  Vomiting  . Penicillins Other (See Comments)    Stomach cramps- can tolerate cephalosporins  . Sulfa Antibiotics Other (See Comments)    Childhood Allergy  . Sulfonamide Derivatives Other (See Comments)    Reaction occurred during childhood     Family History  Problem Relation Age of Onset  . Multiple sclerosis Father   . Cervical cancer Mother   . Hypertension Mother   . Heart disease Unknown   . Lung disease Unknown   . Asthma Unknown   . Diabetes Unknown   . Kidney disease Unknown   . Anesthesia problems Neg Hx   . Hypotension Neg Hx   . Malignant hyperthermia Neg Hx   . Pseudochol deficiency Neg Hx   . Colon cancer Neg Hx     Social History:  reports that she has been smoking cigarettes.  She has a 10.00 pack-year smoking history. She has never used smokeless tobacco. She reports that she does not drink alcohol or use drugs.  ROS: A complete review of systems was performed.  All systems are negative except for pertinent findings as noted.  Physical Exam:  Vital signs in last 24 hours: Temp:  [97.1 F (36.2 C)-98.4 F (36.9 C)] 97.1 F (36.2 C) (04/20 1152) Pulse Rate:  [90-114] 90 (04/20 1610) Resp:  [20-24] 22 (04/20 1610) BP: (104-147)/(87-112) 104/94 (04/20 1610) SpO2:  [91 %-98 %] 98 % (04/20 1610) Weight:  [117.9 kg (260 lb)] 117.9 kg (260 lb) (04/20 0955)   Constitutional:  Alert and oriented, No acute distress,  Cardiovascular: Regular rate and rhythm, No JVD Respiratory: Normal respiratory effort, Lungs clear bilaterally GI: Abdomen is soft, nontender, nondistended, no abdominal masses GU: left CVA tenderness,  Lymphatic: No lymphadenopathy Neurologic: Grossly intact, no focal deficits Psychiatric: Normal mood and affect    Laboratory Data:  Recent Labs    05/03/17 1015  WBC 13.6*  HGB 13.7  HCT 42.2  PLT 326    Recent Labs    05/03/17 1015  NA 137  K 3.9  CL 99*  GLUCOSE 136*  BUN 13  CALCIUM 9.0  CREATININE 0.89     Results  for orders placed or performed during the hospital encounter of 05/03/17 (from the past 24 hour(s))  Urinalysis, Routine w reflex microscopic     Status: Abnormal   Collection Time: 05/03/17 10:04 AM  Result Value Ref Range   Color, Urine YELLOW YELLOW   APPearance HAZY (A) CLEAR   Specific Gravity, Urine 1.020 1.005 - 1.030   pH 5.0 5.0 - 8.0   Glucose, UA NEGATIVE NEGATIVE mg/dL   Hgb urine dipstick MODERATE (A) NEGATIVE   Bilirubin Urine NEGATIVE NEGATIVE   Ketones, ur NEGATIVE NEGATIVE mg/dL   Protein, ur NEGATIVE NEGATIVE mg/dL   Nitrite POSITIVE (A) NEGATIVE   Leukocytes, UA LARGE (A) NEGATIVE   RBC / HPF 6-30 0 - 5 RBC/hpf   WBC, UA TOO NUMEROUS TO COUNT 0 - 5 WBC/hpf   Bacteria, UA RARE (A) NONE SEEN   Squamous Epithelial / LPF 6-30 (A) NONE SEEN   Mucus PRESENT   Pregnancy, urine  Status: None   Collection Time: 05/03/17 10:04 AM  Result Value Ref Range   Preg Test, Ur NEGATIVE NEGATIVE  Comprehensive metabolic panel     Status: Abnormal   Collection Time: 05/03/17 10:15 AM  Result Value Ref Range   Sodium 137 135 - 145 mmol/L   Potassium 3.9 3.5 - 5.1 mmol/L   Chloride 99 (L) 101 - 111 mmol/L   CO2 27 22 - 32 mmol/L   Glucose, Bld 136 (H) 65 - 99 mg/dL   BUN 13 6 - 20 mg/dL   Creatinine, Ser 0.980.89 0.44 - 1.00 mg/dL   Calcium 9.0 8.9 - 11.910.3 mg/dL   Total Protein 7.4 6.5 - 8.1 g/dL   Albumin 3.6 3.5 - 5.0 g/dL   AST 14 (L) 15 - 41 U/L   ALT 15 14 - 54 U/L   Alkaline Phosphatase 96 38 - 126 U/L   Total Bilirubin 0.6 0.3 - 1.2 mg/dL   GFR calc non Af Amer >60 >60 mL/min   GFR calc Af Amer >60 >60 mL/min   Anion gap 11 5 - 15  CBC with Differential     Status: Abnormal   Collection Time: 05/03/17 10:15 AM  Result Value Ref Range   WBC 13.6 (H) 4.0 - 10.5 K/uL   RBC 4.89 3.87 - 5.11 MIL/uL   Hemoglobin 13.7 12.0 - 15.0 g/dL   HCT 14.742.2 82.936.0 - 56.246.0 %   MCV 86.3 78.0 - 100.0 fL   MCH 28.0 26.0 - 34.0 pg   MCHC 32.5 30.0 - 36.0 g/dL   RDW 13.013.3 86.511.5 - 78.415.5 %    Platelets 326 150 - 400 K/uL   Neutrophils Relative % 76 %   Neutro Abs 10.3 (H) 1.7 - 7.7 K/uL   Lymphocytes Relative 14 %   Lymphs Abs 1.9 0.7 - 4.0 K/uL   Monocytes Relative 6 %   Monocytes Absolute 0.8 0.1 - 1.0 K/uL   Eosinophils Relative 4 %   Eosinophils Absolute 0.5 0.0 - 0.7 K/uL   Basophils Relative 0 %   Basophils Absolute 0.0 0.0 - 0.1 K/uL   No results found for this or any previous visit (from the past 240 hour(s)).  Renal Function: Recent Labs    05/03/17 1015  CREATININE 0.89   Estimated Creatinine Clearance: 109.7 mL/min (by C-G formula based on SCr of 0.89 mg/dL).  Radiologic Imaging: Ct Renal Stone Study  Result Date: 05/03/2017 CLINICAL DATA:  Onset of left flank pain 1 hour ago. History of urinary tract stones. EXAM: CT ABDOMEN AND PELVIS WITHOUT CONTRAST TECHNIQUE: Multidetector CT imaging of the abdomen and pelvis was performed following the standard protocol without IV contrast. COMPARISON:  CT abdomen and pelvis 06/13/2016 and 04/07/2015. FINDINGS: Lower chest: Mild right basilar atelectasis noted. 0.4 cm right middle lobe nodule is unchanged since 2017 on image 3. Hepatobiliary: No focal liver abnormality is seen. Status post cholecystectomy. No biliary dilatation. Pancreas: Unremarkable. No pancreatic ductal dilatation or surrounding inflammatory changes. Spleen: Normal in size without focal abnormality. Adrenals/Urinary Tract: The patient has moderate left hydronephrosis due to a stone at the ureteropelvic junction. The stone measures 0.9 cm AP by 0.5 cm transverse by 1.2 cm craniocaudal. The patient also has a 0.3 cm nonobstructing stone in the midpole of the left kidney. No right urinary tract stones are identified. The kidneys are otherwise normal. Urinary bladder appears normal. Adrenal glands are normal. Stomach/Bowel: Sigmoid diverticulosis without diverticulitis is seen. The colon is otherwise unremarkable. The stomach,  small bowel and appendix appear  normal. Vascular/Lymphatic: No significant vascular findings are present. No enlarged abdominal or pelvic lymph nodes. Reproductive: Uterus and bilateral adnexa are unremarkable. Other: No ascites.  Small fat containing umbilical hernia noted. Musculoskeletal: Negative. IMPRESSION: Moderate left hydronephrosis due to a 0.9 x 0.5 x 1.2 cm stone at the ureteropelvic junction. 0.3 cm nonobstructing stone midpole left kidney. Sigmoid diverticulosis without diverticulitis. Small fat containing umbilical hernia. Electronically Signed   By: Drusilla Kanner M.D.   On: 05/03/2017 11:07    Assessment:  [name] [name] is a 27 year old female with an obstructing left UPJ calculus, acute cystitis and left renal colic History of stones Morbid obesity  Plan:  The risk, benefits and alternatives of cystoscopy with left JJ stent placement was discussed with the patient and her husband.  Risks include, but are not limited to, bleeding, worsening infection, ureteral injury, urosepsis, stent related discomfort and the inherent risk with general anesthesia.  The patient voices understanding and wishes to proceed.  Rhoderick Moody, MD 05/03/2017, 4:13 PM  [facility] Pager: [phone]

## 2017-05-03 NOTE — Progress Notes (Signed)
Pt arrived on the unit at 1840.  VSS, pain medication given and call bell within reach.

## 2017-05-03 NOTE — ED Notes (Signed)
Pt tearful and moving during bp and other vital signs being obtained.

## 2017-05-03 NOTE — Anesthesia Postprocedure Evaluation (Signed)
Anesthesia Post Note  Patient: Development worker, [name]  Procedure(s) Performed: CYSTOSCOPY WITH LEFT URETERAL STENT PLACEMENT (Left Ureter)     Patient location during evaluation: PACU Anesthesia Type: General Level of consciousness: awake and alert, oriented and patient cooperative Pain management: pain level controlled Vital Signs Assessment: post-procedure vital signs reviewed and stable Respiratory status: spontaneous breathing, nonlabored ventilation, respiratory function stable and patient connected to nasal cannula oxygen Cardiovascular status: blood pressure returned to baseline and stable Postop Assessment: no apparent nausea or vomiting Anesthetic complications: no    Last Vitals:  Vitals:   05/03/17 1815 05/03/17 1838  BP: 136/85 (!) 144/93  Pulse: 94 93  Resp: 12 16  Temp: 36.9 C 36.8 C  SpO2: 98% 98%    Last Pain:  Vitals:   05/03/17 1935  TempSrc:   PainSc: Asleep                 [name],E. Brannon Decaire

## 2017-05-03 NOTE — ED Provider Notes (Signed)
[facility] EMERGENCY DEPARTMENT Provider Note   CSN: [id number] Arrival date & time: 05/03/17  4098     History   Chief Complaint Chief Complaint  Patient presents with  . Flank Pain    HPI Concettina Leth is a 27 y.o. female.  HPI  Patient is a 27 year old female with a history of asthma, nephrolithiasis, degenerative disc disease, depression, fiber myalgia, and GERD presenting for left flank pain.  Patient reporting that current episode  feels exactly like her prior kidney stone pain.  Patient reports began suddenly approximate 1 hour ago.  Patient reports she has had some urinary urgency over the last couple days without notable hematuria, but no suprapubic or abdominal pain.  Patient denies nausea or vomiting on presentation.  Patient denies fever or chills.  Patient denies unusual vaginal discharge.  Last menstrual period approximately 2 weeks ago.  Patient denies concerns about sexually transmitted infection exposure, and is sexually active with one female partner.  No remedies attempted prior to arrival for symptoms.  Past Medical History:  Diagnosis Date  . Asthma   . Bulging disc   . Chronic back pain   . Chronic pain    Dr. Kennie Karapetian Hodgkins in [place]  . Complication of anesthesia    larynaspasm after extubation following Cholecystectomy  . DDD (degenerative disc disease)   . Depression   . Fibromyalgia   . GERD (gastroesophageal reflux disease)   . History of bronchitis   . Kidney stone   . Numbness and tingling in hands   . Panic attack   . PONV (postoperative nausea and vomiting)    "a little bit of nausea"  . Popping of temporomandibular joint on opening of jaw   . Renal disorder    kidney stones  . Scoliosis   . Spinal bifida, closed   . Syringomyelia [facility])    thoracic spine    Patient Active Problem List   Diagnosis Date Noted  . Vitamin D deficiency 10/06/2015  . Depression with anxiety 10/06/2014  . Encounter for long-term opiate analgesic use 07/28/2014  .  Acanthosis nigricans 04/29/2014  . Morbid obesity (HCC) 02/13/2014  . Fibromyalgia 04/03/2012  . Asthma 04/03/2012  . Kidney stones 04/03/2012  . Right anterior knee pain 08/29/2011  . Periumbilical pain 05/29/2010  . NAUSEA AND VOMITING 01/23/2010  . DIARRHEA 01/23/2010  . RUQ PAIN 01/23/2010  . 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 Chronic back pain 03/17/2008    Past Surgical History:  Procedure Laterality Date  . BREATH HYDROGEN TEST  08/08/2010      . CERVICAL CONIZATION W/BX N/A 06/01/2014   Procedure: LASER CONIZATION OF CERVIX;  Surgeon: Lazaro Arms, MD;  Location: [facility] ORS;  Service: Gynecology;  Laterality: N/A;  . CHOLECYSTECTOMY N/A 03/26/2013   Procedure: LAPAROSCOPIC CHOLECYSTECTOMY;  Surgeon: Dalia Heading, MD;  Location: [facility] ORS;  Service: General;  Laterality: N/A;  . COLONOSCOPY  01/2010   scattered petechiae and fibrotic-appearing mucosa of uncertain significance , ?resolving infection, bx benign  . CYSTOSCOPY W/ URETERAL STENT PLACEMENT  05/28/2011   Procedure: CYSTOSCOPY WITH RETROGRADE PYELOGRAM/URETERAL STENT PLACEMENT;  Surgeon: Ky Barban, MD;  Location: [facility] ORS;  Service: Urology;  Laterality: Right;  Cystoscopy with Right Retrograde Pyelogram  . CYSTOSCOPY WITH URETHRAL DILATATION  05/28/2011   Procedure: CYSTOSCOPY WITH URETHRAL DILATATION;  Surgeon: Ky Barban, MD;  Location: [facility] ORS;  Service: Urology;  Laterality: N/A;  . ESOPHAGOGASTRODUODENOSCOPY  01/2010   circumferential distal esophageal erosions, hiatal hernia,  excoriations/erosion in gastric body ?trauma from vomiting, bx negative and showed chronic gastritis  . ESOPHAGOGASTRODUODENOSCOPY N/A 02/17/2013   Procedure: ESOPHAGOGASTRODUODENOSCOPY (EGD);  Surgeon: Corbin Ade, MD;  Location: [facility] ENDO SUITE;  Service: Endoscopy;  Laterality: N/A;  11:30-moved to [address] to notify pt  . HOLMIUM LASER APPLICATION N/A 06/01/2014   Procedure: HOLMIUM LASER APPLICATION;  Surgeon:  Lazaro Arms, MD;  Location: [facility] ORS;  Service: Gynecology;  Laterality: N/A;  . HYDROGEN BREATH TEST  08/06/2010   Procedure: HYDROGEN BREATH TEST;  Surgeon: Corbin Ade, MD;  Location: [facility] ORS;  Service: Gastroenterology;  Laterality: N/A;  . MULTIPLE EXTRACTIONS WITH ALVEOLOPLASTY Bilateral 08/04/2015   Procedure: MULTIPLE EXTRACTION;  Surgeon: Ocie Doyne, DDS;  Location: [facility] OR;  Service: Oral Surgery;  Laterality: Bilateral;  . RENAL LITHIASIS REMOVED    . STONE EXTRACTION WITH BASKET  05/28/2011   Procedure: STONE EXTRACTION WITH BASKET;  Surgeon: Ky Barban, MD;  Location: [facility];  Service: Urology;  Laterality: Right;     OB History    Gravida  1   Para  1   Term  1   Preterm      AB      Living  1     SAB      TAB      Ectopic      Multiple      Live Births               Home Medications    Prior to Admission medications   Medication Sig Start Date End Date Taking? Authorizing Provider  ibuprofen (ADVIL,MOTRIN) 800 MG tablet Take 1 tablet (800 mg total) by mouth 3 (three) times daily. 02/25/17   Pauline Aus, PA-C    Family History Family History  Problem Relation Age of Onset  . Multiple sclerosis Father   . Cervical cancer Mother   . Hypertension Mother   . Heart disease Unknown   . Lung disease Unknown   . Asthma Unknown   . Diabetes Unknown   . Kidney disease Unknown   . Anesthesia problems Neg Hx   . Hypotension Neg Hx   . Malignant hyperthermia Neg Hx   . Pseudochol deficiency Neg Hx   . Colon cancer Neg Hx     Social History Social History   Tobacco Use  . Smoking status: Current Every Day Smoker    Packs/day: 1.00    Years: 10.00    Pack years: 10.00    Types: Cigarettes  . Smokeless tobacco: Never Used  Substance Use Topics  . Alcohol use: No    Alcohol/week: 0.0 oz  . Drug use: No     Allergies   Sulfamethoxazole; Cefzil [cefprozil]; Penicillins; Sulfa antibiotics; and Sulfonamide derivatives   Review of  Systems Review of Systems  Constitutional: Negative for chills and fever.  Gastrointestinal: Positive for abdominal pain. Negative for blood in stool, diarrhea, nausea and vomiting.  Genitourinary: Positive for flank pain and frequency. Negative for dysuria, hematuria, vaginal bleeding and vaginal discharge.  All other systems reviewed and are negative.    Physical Exam Updated Vital Signs BP (!) 124/110   Pulse (!) 103   Temp 98.4 F (36.9 C) (Temporal)   Ht 5' (1.524 m)   Wt 117.9 kg (260 lb)   LMP 04/14/2017 Comment: neg preg  SpO2 92%   BMI 50.78 kg/m   Physical Exam  Constitutional: She appears well-developed and well-nourished. No distress.  HENT:  Head: Normocephalic and atraumatic.  Mouth/Throat: Oropharynx is clear and moist.  Eyes: Pupils are equal, round, and reactive to light. Conjunctivae and EOM are normal.  Neck: Normal range of motion. Neck supple.  Cardiovascular: Normal rate, regular rhythm, S1 normal and S2 normal.  No murmur heard. Pulmonary/Chest: Effort normal and breath sounds normal. She has no wheezes. She has no rales.  Abdominal: Soft. She exhibits no distension. There is tenderness. There is no guarding.  Diffuse TTP of left abdomen Positive CVA tenderness.  Musculoskeletal: Normal range of motion. She exhibits no edema or deformity.  Lymphadenopathy:    She has no cervical adenopathy.  Neurological: She is alert.  Cranial nerves grossly intact. Patient moves extremities symmetrically and with good coordination.  Skin: Skin is warm and dry. No rash noted. No erythema.  Psychiatric: She has a normal mood and affect. Her behavior is normal. Judgment and thought content normal.  Nursing note and vitals reviewed.    ED Treatments / Results  Labs (all labs ordered are listed, but only abnormal results are displayed) Labs Reviewed  URINALYSIS, ROUTINE W REFLEX MICROSCOPIC - Abnormal; Notable for the following components:      Result Value    APPearance HAZY (*)    Hgb urine dipstick MODERATE (*)    Nitrite POSITIVE (*)    Leukocytes, UA LARGE (*)    Bacteria, UA RARE (*)    Squamous Epithelial / LPF 6-30 (*)    All other components within normal limits  CBC WITH DIFFERENTIAL/PLATELET - Abnormal; Notable for the following components:   WBC 13.6 (*)    Neutro Abs 10.3 (*)    All other components within normal limits  URINE CULTURE  PREGNANCY, URINE  COMPREHENSIVE METABOLIC PANEL    EKG None  Radiology Ct Renal Stone Study  Result Date: 05/03/2017 CLINICAL DATA:  Onset of left flank pain 1 hour ago. History of urinary tract stones. EXAM: CT ABDOMEN AND PELVIS WITHOUT CONTRAST TECHNIQUE: Multidetector CT imaging of the abdomen and pelvis was performed following the standard protocol without IV contrast. COMPARISON:  CT abdomen and pelvis 06/13/2016 and 04/07/2015. FINDINGS: Lower chest: Mild right basilar atelectasis noted. 0.4 cm right middle lobe nodule is unchanged since 2017 on image 3. Hepatobiliary: No focal liver abnormality is seen. Status post cholecystectomy. No biliary dilatation. Pancreas: Unremarkable. No pancreatic ductal dilatation or surrounding inflammatory changes. Spleen: Normal in size without focal abnormality. Adrenals/Urinary Tract: The patient has moderate left hydronephrosis due to a stone at the ureteropelvic junction. The stone measures 0.9 cm AP by 0.5 cm transverse by 1.2 cm craniocaudal. The patient also has a 0.3 cm nonobstructing stone in the midpole of the left kidney. No right urinary tract stones are identified. The kidneys are otherwise normal. Urinary bladder appears normal. Adrenal glands are normal. Stomach/Bowel: Sigmoid diverticulosis without diverticulitis is seen. The colon is otherwise unremarkable. The stomach, small bowel and appendix appear normal. Vascular/Lymphatic: No significant vascular findings are present. No enlarged abdominal or pelvic lymph nodes. Reproductive: Uterus and  bilateral adnexa are unremarkable. Other: No ascites.  Small fat containing umbilical hernia noted. Musculoskeletal: Negative. IMPRESSION: Moderate left hydronephrosis due to a 0.9 x 0.5 x 1.2 cm stone at the ureteropelvic junction. 0.3 cm nonobstructing stone midpole left kidney. Sigmoid diverticulosis without diverticulitis. Small fat containing umbilical hernia. Electronically Signed   By: Drusilla Kanner M.D.   On: 05/03/2017 11:07    Procedures Procedures (including critical care time)  Medications Ordered in ED Medications  sodium  chloride 0.9 % bolus 1,000 mL (1,000 mLs Intravenous New Bag/Given 05/03/17 1029)  morphine 4 MG/ML injection 4 mg (4 mg Intravenous Given 05/03/17 1029)     Initial Impression / Assessment and Plan / ED Course  I have reviewed the triage vital signs and the nursing notes.  Pertinent labs & imaging results that were available during my care of the patient were reviewed by me and considered in my medical decision making (see chart for details).  Clinical Course as of May 04 1723  Sat May 03, 2017  1215 Spoke with Dr. Liliane ShiWinter of urology, who [facility].   [AM]    Clinical Course User Index [AM] Elisha PonderMurray, Akya Fiorello B, PA-C    Patient is nontoxic-appearing, afebrile, but significantly uncomfortable.  Will check rectal temperature to ensure core temp is not febrile.  Differential diagnosis includes nephrolithiasis, obstructive, pyelonephritis, ovarian torsion, ectopic pregnancy.  Urine pregnancy is negative.  Urinalysis demonstrates pyuria, but few bacteria.  Laboratory analysis demonstrates normal kidney function leukocytosis of will 13.6. Obtain CT renal stone study to assess for stone size, pyelonephritis.   Patient exhibits half a millimeter obstructive stone on the left.  Given the pyuria, will consult urology.  Patient pain not improved after 4 mg of morphine.  Patient did achieve analgesia with multiple rounds  of Dilaudid.  [facility]. [facility] contacted.  Patient and family in understanding and agree with the plan of care.  Final Clinical Impressions(s) / ED Diagnoses   Final diagnoses:  Left ureteral stone  Pyuria    ED Discharge Orders    None       Delia ChimesMurray, Alli Jasmer B, PA-C 05/03/17 1727    Benjiman CorePickering, Nathan, MD 05/04/17 1525

## 2017-05-03 NOTE — ED Notes (Signed)
Provider at bedside

## 2017-05-03 NOTE — ED Notes (Signed)
Dr Rubin PayorPickering gave order for EMS to adm Toradol 15 mg IV

## 2017-05-03 NOTE — ED Notes (Signed)
Called PACU and updated report

## 2017-05-03 NOTE — ED Notes (Signed)
Pt very uncomfortable. Moaning and crying out in pain. Notified EDP. Secretary called to check on status of [facility] truck. . Another truck coming on in 30 mins and if nothing emergent will come transport pt

## 2017-05-03 NOTE — Anesthesia Preprocedure Evaluation (Addendum)
Anesthesia Evaluation  Patient identified by MRN, date of birth, ID band Patient awake    Reviewed: Allergy & Precautions, NPO status , Patient's Chart, lab work & pertinent test results  History of Anesthesia Complications (+) history of anesthetic complications (laryngospasm on extubation)  Airway Mallampati: II  TM Distance: >3 FB Neck ROM: Full    Dental  (+) Dental Advisory Given, Poor Dentition, Missing   Pulmonary Current Smoker,    breath sounds clear to auscultation       Cardiovascular negative cardio ROS   Rhythm:Regular Rate:Normal     Neuro/Psych Anxiety Depression Chronic back pain Spina bifida, syringomyelia    GI/Hepatic Neg liver ROS, GERD  Controlled,  Endo/Other  Morbid obesity  Renal/GU stones     Musculoskeletal  (+) Fibromyalgia -  Abdominal (+) + obese,   Peds  Hematology negative hematology ROS (+)   Anesthesia Other Findings   Reproductive/Obstetrics                            Anesthesia Physical Anesthesia Plan  ASA: III  Anesthesia Plan: General   Post-op Pain Management:    Induction: Intravenous and Rapid sequence  PONV Risk Score and Plan: 2 and Dexamethasone and Ondansetron  Airway Management Planned: Oral ETT  Additional Equipment:   Intra-op Plan:   Post-operative Plan: Extubation in OR  Informed Consent: I have reviewed the patients History and Physical, chart, labs and discussed the procedure including the risks, benefits and alternatives for the proposed anesthesia with the patient or authorized representative who has indicated his/her understanding and acceptance.   Dental advisory given  Plan Discussed with: CRNA and Surgeon  Anesthesia Plan Comments: (Plan routine monitors, GETA)        Anesthesia Quick Evaluation

## 2017-05-03 NOTE — ED Notes (Signed)
[facility] left with Pt

## 2017-05-03 NOTE — Transfer of Care (Signed)
Immediate Anesthesia Transfer of Care Note  Patient: Development worker, [name]  Procedure(s) Performed: CYSTOSCOPY WITH LEFT URETERAL STENT PLACEMENT (Left Ureter)  Patient Location:PACU  Anesthesia Type:General  Level of Consciousness: awake, alert , oriented and patient cooperative  Airway & Oxygen Therapy: Patient Spontanous Breathing and Patient connected to face mask oxygen  Post-op Assessment: Report given to RN, Post -op Vital signs reviewed and stable and Patient moving all extremities X 4  Post vital signs: stable  Last Vitals:  Vitals Value Taken Time  BP 133/86 05/03/2017  5:45 PM  Temp    Pulse 94 05/03/2017  5:50 PM  Resp 15 05/03/2017  5:50 PM  SpO2 95 % 05/03/2017  5:50 PM  Vitals shown include unvalidated device data.  Last Pain:  Vitals:   05/03/17 1730  TempSrc:   PainSc: (P) 0-No pain         Complications: No apparent anesthesia complications

## 2017-05-04 ENCOUNTER — Encounter (HOSPITAL_COMMUNITY): Payer: Self-pay | Admitting: Urology

## 2017-05-04 LAB — URINE CULTURE

## 2017-05-04 LAB — BASIC METABOLIC PANEL
Anion gap: 9 (ref 5–15)
BUN: 12 mg/dL (ref 6–20)
CALCIUM: 8.6 mg/dL — AB (ref 8.9–10.3)
CO2: 23 mmol/L (ref 22–32)
CREATININE: 1.1 mg/dL — AB (ref 0.44–1.00)
Chloride: 106 mmol/L (ref 101–111)
GFR calc Af Amer: >60 mL/min (ref >60)
GFR calc non Af Amer: >60 mL/min (ref >60)
GLUCOSE: 156 mg/dL — AB (ref 65–99)
Potassium: 4.2 mmol/L (ref 3.5–5.1)
Sodium: 138 mmol/L (ref 135–145)

## 2017-05-04 LAB — CBC
HCT: 39.8 % (ref 36.0–46.0)
HEMOGLOBIN: 12.7 g/dL (ref 12.0–15.0)
MCH: 27.8 pg (ref 26.0–34.0)
MCHC: 31.9 g/dL (ref 30.0–36.0)
MCV: 87.1 fL (ref 78.0–100.0)
Platelets: 333 10*3/uL (ref 150–400)
RBC: 4.57 MIL/uL (ref 3.87–5.11)
RDW: 13.6 % (ref 11.5–15.5)
WBC: 20.2 10*3/uL — ABNORMAL HIGH (ref 4.0–10.5)

## 2017-05-04 LAB — HIV ANTIBODY (ROUTINE TESTING W REFLEX): HIV SCREEN 4TH GENERATION: NONREACTIVE

## 2017-05-04 MED ORDER — PHENAZOPYRIDINE HCL 200 MG PO TABS: 200.0000 mg | ORAL_TABLET | Freq: Three times a day (TID) | ORAL | 0 refills | Status: DC | PRN

## 2017-05-04 MED ORDER — CIPROFLOXACIN HCL 500 MG PO TABS: 500.0000 mg | ORAL_TABLET | Freq: Two times a day (BID) | ORAL | 0 refills | Status: DC

## 2017-05-04 MED ORDER — ONDANSETRON HCL 4 MG PO TABS: 4.0000 mg | ORAL_TABLET | Freq: Every day | ORAL | 1 refills | Status: AC | PRN

## 2017-05-04 MED ORDER — HYDROCODONE-ACETAMINOPHEN 5-325 MG PO TABS: 1.0000 | ORAL_TABLET | ORAL | 0 refills | Status: DC | PRN

## 2017-05-04 NOTE — Discharge Summary (Signed)
Date of admission: 05/03/2017  Date of discharge: 05/04/2017  Admission diagnosis: Obstructing left UPJ stone   Discharge diagnosis: Same   History and Physical: For full details, please see admission history and physical. Briefly, Meredith Modymber Hammonds is a 27 y.o. year old patient with an obstructing left UPJ stone and left renal colic.  She underwent left JJ stent placement on 05/03/17.  Hospital Course: The patient was monitored on the floor post-op.  She remained afebrile and her other vital signs improved to WNL following her stent placement and initiation of PO cipro.  Physical Exam:  General: Alert and oriented CV: RRR, palpable distal pulses Lungs: CTAB, equal chest rise Abdomen: Soft, NTND, no rebound or guarding GU: Foley draining clear-yellow urine Ext: NT, No erythema  Laboratory values:  Recent Labs    05/03/17 1015 05/04/17 0503  HGB 13.7 12.7  HCT 42.2 39.8   Recent Labs    05/03/17 1015 05/04/17 0503  CREATININE 0.89 1.10*    Disposition: Home  Discharge instruction: The patient was instructed to be ambulatory but told to refrain from heavy lifting, strenuous activity, or driving.  Discharge medications: One week course of PO cipro and pain medication Allergies as of 05/04/2017      Reactions   Sulfamethoxazole Anaphylaxis   Cefzil [cefprozil] Nausea And Vomiting   Penicillins Other (See Comments)   Stomach cramps- can tolerate cephalosporins   Sulfa Antibiotics Other (See Comments)   Childhood Allergy   Sulfonamide Derivatives Other (See Comments)   Reaction occurred during childhood      Medication List    TAKE these medications   ciprofloxacin 500 MG tablet Commonly known as:  CIPRO Take 1 tablet (500 mg total) by mouth 2 (two) times daily.   HYDROcodone-acetaminophen 5-325 MG tablet Commonly known as:  NORCO Take 1 tablet by mouth every 4 (four) hours as needed for moderate pain.   ibuprofen 800 MG tablet Commonly known as:  ADVIL,MOTRIN Take  1 tablet (800 mg total) by mouth 3 (three) times daily.   ondansetron 4 MG tablet Commonly known as:  ZOFRAN Take 1 tablet (4 mg total) by mouth daily as needed for nausea or vomiting.   phenazopyridine 200 MG tablet Commonly known as:  PYRIDIUM Take 1 tablet (200 mg total) by mouth 3 (three) times daily as needed for pain.       Followup:  My office will contact the patient to schedule her left ureteroscopy in the next 1-2 weeks.

## 2017-05-04 NOTE — Progress Notes (Signed)
Patient is stable for discharge. Discharge instructions and medications have been reviewed with the patient and all questions answered. AVS and prescriptions given to patient.  

## 2017-05-05 LAB — URINE CULTURE: CULTURE: NO GROWTH

## 2017-05-05 LAB — HIGH SENSITIVITY CRP: CRP, High Sensitivity: 11.87 mg/L — ABNORMAL HIGH (ref 0.00–3.00)

## 2017-05-06 ENCOUNTER — Other Ambulatory Visit: Payer: Self-pay | Admitting: Urology

## 2017-05-09 ENCOUNTER — Encounter (HOSPITAL_BASED_OUTPATIENT_CLINIC_OR_DEPARTMENT_OTHER): Payer: Self-pay | Admitting: *Deleted

## 2017-05-09 ENCOUNTER — Other Ambulatory Visit: Payer: Self-pay

## 2017-05-09 NOTE — Progress Notes (Addendum)
SPOKE W/ PT VIA PHONE FOR PRE-OP INTERVIEW.  NPO AFTER MN.  ARRIV EAT 0630.  CURRENT LAB RESULTS DATED 05-04-2017 IN CHART AND Epic.  MAY TAKE HYDROCODONE/ ZOFRAN AM DOS W/ SIPS OF WATER.  ADDENDUM:  REVIEWED CHART W/ DR Michelle PiperSSEY MDA, FACE TO FACE, TODAY 05-13-2017 AT [id number].  SINCE BMI IS .78 OVER 50 PER GUIDELINES.  PER DR OSSEY MDA, OK TO PROCEED.

## 2017-05-12 ENCOUNTER — Encounter (HOSPITAL_BASED_OUTPATIENT_CLINIC_OR_DEPARTMENT_OTHER): Payer: Self-pay | Admitting: *Deleted

## 2017-05-14 ENCOUNTER — Ambulatory Visit (HOSPITAL_BASED_OUTPATIENT_CLINIC_OR_DEPARTMENT_OTHER): Payer: Self-pay | Admitting: Anesthesiology

## 2017-05-14 ENCOUNTER — Other Ambulatory Visit: Payer: Self-pay

## 2017-05-14 ENCOUNTER — Encounter (HOSPITAL_BASED_OUTPATIENT_CLINIC_OR_DEPARTMENT_OTHER): Payer: Self-pay | Admitting: *Deleted

## 2017-05-14 ENCOUNTER — Ambulatory Visit (HOSPITAL_BASED_OUTPATIENT_CLINIC_OR_DEPARTMENT_OTHER)
Admission: RE | Admit: 2017-05-14 | Discharge: 2017-05-14 | Disposition: A | Discharge status: Home / Self Care | Payer: Self-pay | Source: Ambulatory Visit | Attending: Urology | Admitting: Urology

## 2017-05-14 ENCOUNTER — Encounter (HOSPITAL_BASED_OUTPATIENT_CLINIC_OR_DEPARTMENT_OTHER)
Admission: RE | Disposition: A | Discharge status: Home / Self Care | Payer: Self-pay | Source: Ambulatory Visit | Attending: Urology

## 2017-05-14 DIAGNOSIS — Z888 Allergy status to other drugs, medicaments and biological substances status: Secondary | ICD-10-CM | POA: Insufficient documentation

## 2017-05-14 DIAGNOSIS — F411 Generalized anxiety disorder: Secondary | ICD-10-CM | POA: Insufficient documentation

## 2017-05-14 DIAGNOSIS — N201 Calculus of ureter: Secondary | ICD-10-CM | POA: Insufficient documentation

## 2017-05-14 DIAGNOSIS — F17210 Nicotine dependence, cigarettes, uncomplicated: Secondary | ICD-10-CM | POA: Insufficient documentation

## 2017-05-14 DIAGNOSIS — Z87442 Personal history of urinary calculi: Secondary | ICD-10-CM | POA: Insufficient documentation

## 2017-05-14 DIAGNOSIS — Z882 Allergy status to sulfonamides status: Secondary | ICD-10-CM | POA: Insufficient documentation

## 2017-05-14 DIAGNOSIS — M797 Fibromyalgia: Secondary | ICD-10-CM | POA: Insufficient documentation

## 2017-05-14 DIAGNOSIS — Q760 Spina bifida occulta: Secondary | ICD-10-CM | POA: Insufficient documentation

## 2017-05-14 DIAGNOSIS — M419 Scoliosis, unspecified: Secondary | ICD-10-CM | POA: Insufficient documentation

## 2017-05-14 DIAGNOSIS — Z880 Allergy status to penicillin: Secondary | ICD-10-CM | POA: Insufficient documentation

## 2017-05-14 DIAGNOSIS — F329 Major depressive disorder, single episode, unspecified: Secondary | ICD-10-CM | POA: Insufficient documentation

## 2017-05-14 DIAGNOSIS — G894 Chronic pain syndrome: Secondary | ICD-10-CM | POA: Insufficient documentation

## 2017-05-14 DIAGNOSIS — J4520 Mild intermittent asthma, uncomplicated: Secondary | ICD-10-CM | POA: Insufficient documentation

## 2017-05-14 DIAGNOSIS — G950 Syringomyelia and syringobulbia: Secondary | ICD-10-CM | POA: Insufficient documentation

## 2017-05-14 DIAGNOSIS — K219 Gastro-esophageal reflux disease without esophagitis: Secondary | ICD-10-CM | POA: Insufficient documentation

## 2017-05-14 DIAGNOSIS — Z6841 Body Mass Index (BMI) 40.0 and over, adult: Secondary | ICD-10-CM | POA: Insufficient documentation

## 2017-05-14 HISTORY — DX: Generalized anxiety disorder: F41.1

## 2017-05-14 HISTORY — DX: Personal history of other diseases of the female genital tract: Z87.42

## 2017-05-14 HISTORY — DX: Diaphragmatic hernia without obstruction or gangrene: K44.9

## 2017-05-14 HISTORY — DX: Personal history of urinary calculi: Z87.442

## 2017-05-14 HISTORY — DX: Mild intermittent asthma, uncomplicated: J45.20

## 2017-05-14 HISTORY — DX: Personal history of other mental and behavioral disorders: Z86.59

## 2017-05-14 HISTORY — DX: Personal history of cervical dysplasia: Z87.410

## 2017-05-14 HISTORY — DX: Syringomyelia and syringobulbia: G95.0

## 2017-05-14 HISTORY — PX: CYSTOSCOPY/URETEROSCOPY/HOLMIUM LASER/STENT PLACEMENT: SHX6546

## 2017-05-14 HISTORY — DX: Spina bifida occulta: Q76.0

## 2017-05-14 HISTORY — DX: Calculus of kidney: N20.0

## 2017-05-14 HISTORY — DX: Calculus of ureter: N20.1

## 2017-05-14 HISTORY — DX: Chronic pain syndrome: G89.4

## 2017-05-14 LAB — POCT PREGNANCY, URINE: Preg Test, Ur: NEGATIVE

## 2017-05-14 SURGERY — CYSTOSCOPY/URETEROSCOPY/HOLMIUM LASER/STENT PLACEMENT
Anesthesia: General | Laterality: Left

## 2017-05-14 MED ORDER — HYDROCODONE-ACETAMINOPHEN 5-325 MG PO TABS
1.0000 | ORAL_TABLET | ORAL | Status: DC | PRN
  Filled 2017-05-14: qty 1

## 2017-05-14 MED ORDER — DEXAMETHASONE SODIUM PHOSPHATE 4 MG/ML IJ SOLN
INTRAMUSCULAR | Status: DC | PRN
  Administered 2017-05-14: 10 mg via INTRAVENOUS

## 2017-05-14 MED ORDER — ONDANSETRON HCL 4 MG/2ML IJ SOLN
INTRAMUSCULAR | Status: DC | PRN
  Administered 2017-05-14: 8 mg via INTRAVENOUS

## 2017-05-14 MED ORDER — PROPOFOL 10 MG/ML IV BOLUS
INTRAVENOUS | Status: AC
  Filled 2017-05-14: qty 40

## 2017-05-14 MED ORDER — FENTANYL CITRATE (PF) 100 MCG/2ML IJ SOLN
INTRAMUSCULAR | Status: DC | PRN
  Administered 2017-05-14: 25 ug via INTRAVENOUS
  Administered 2017-05-14: 50 ug via INTRAVENOUS
  Administered 2017-05-14: 25 ug via INTRAVENOUS
  Administered 2017-05-14: 75 ug via INTRAVENOUS
  Administered 2017-05-14: 25 ug via INTRAVENOUS
  Administered 2017-05-14: 100 ug via INTRAVENOUS

## 2017-05-14 MED ORDER — CIPROFLOXACIN IN D5W 400 MG/200ML IV SOLN
INTRAVENOUS | Status: AC
  Filled 2017-05-14: qty 200

## 2017-05-14 MED ORDER — KETOROLAC TROMETHAMINE 30 MG/ML IJ SOLN
INTRAMUSCULAR | Status: AC
  Filled 2017-05-14: qty 1

## 2017-05-14 MED ORDER — PROPOFOL 10 MG/ML IV BOLUS
INTRAVENOUS | Status: DC | PRN
  Administered 2017-05-14: 240 mg via INTRAVENOUS
  Administered 2017-05-14: 50 mg via INTRAVENOUS

## 2017-05-14 MED ORDER — KETOROLAC TROMETHAMINE 30 MG/ML IJ SOLN
INTRAMUSCULAR | Status: DC | PRN
  Administered 2017-05-14: 30 mg via INTRAVENOUS

## 2017-05-14 MED ORDER — PROMETHAZINE HCL 25 MG/ML IJ SOLN
6.2500 mg | INTRAMUSCULAR | Status: DC | PRN
  Filled 2017-05-14: qty 1

## 2017-05-14 MED ORDER — LIDOCAINE HCL (CARDIAC) PF 100 MG/5ML IV SOSY
PREFILLED_SYRINGE | INTRAVENOUS | Status: DC | PRN
  Administered 2017-05-14: 100 mg via INTRAVENOUS

## 2017-05-14 MED ORDER — DEXAMETHASONE SODIUM PHOSPHATE 10 MG/ML IJ SOLN
INTRAMUSCULAR | Status: AC
  Filled 2017-05-14: qty 1

## 2017-05-14 MED ORDER — KETOROLAC TROMETHAMINE 30 MG/ML IJ SOLN
30.0000 mg | Freq: Once | INTRAMUSCULAR | Status: DC | PRN
  Filled 2017-05-14: qty 1

## 2017-05-14 MED ORDER — MIDAZOLAM HCL 2 MG/2ML IJ SOLN
INTRAMUSCULAR | Status: AC
  Filled 2017-05-14: qty 2

## 2017-05-14 MED ORDER — HYDROCODONE-ACETAMINOPHEN 5-325 MG PO TABS: 1.0000 | ORAL_TABLET | ORAL | 0 refills | Status: DC | PRN

## 2017-05-14 MED ORDER — FENTANYL CITRATE (PF) 100 MCG/2ML IJ SOLN
25.0000 ug | INTRAMUSCULAR | Status: DC | PRN
  Administered 2017-05-14: 25 ug via INTRAVENOUS
  Filled 2017-05-14: qty 1

## 2017-05-14 MED ORDER — LACTATED RINGERS IV SOLN
INTRAVENOUS | Status: DC
  Administered 2017-05-14 (×2): via INTRAVENOUS
  Filled 2017-05-14: qty 1000

## 2017-05-14 MED ORDER — MIDAZOLAM HCL 5 MG/5ML IJ SOLN
INTRAMUSCULAR | Status: DC | PRN
  Administered 2017-05-14: 2 mg via INTRAVENOUS

## 2017-05-14 MED ORDER — FENTANYL CITRATE (PF) 100 MCG/2ML IJ SOLN
INTRAMUSCULAR | Status: AC
  Filled 2017-05-14: qty 2

## 2017-05-14 MED ORDER — CIPROFLOXACIN IN D5W 400 MG/200ML IV SOLN
400.0000 mg | Freq: Once | INTRAVENOUS | Status: AC
  Administered 2017-05-14: 400 mg via INTRAVENOUS
  Filled 2017-05-14: qty 200

## 2017-05-14 MED ORDER — SODIUM CHLORIDE 0.9 % IR SOLN
Status: DC | PRN
  Administered 2017-05-14: 3000 mL via INTRAVESICAL

## 2017-05-14 MED ORDER — CIPROFLOXACIN HCL 500 MG PO TABS: 500.0000 mg | ORAL_TABLET | Freq: Two times a day (BID) | ORAL | 0 refills | Status: DC

## 2017-05-14 MED ORDER — ONDANSETRON HCL 4 MG/2ML IJ SOLN
INTRAMUSCULAR | Status: AC
  Filled 2017-05-14: qty 2

## 2017-05-14 MED ORDER — LIDOCAINE 2% (20 MG/ML) 5 ML SYRINGE
INTRAMUSCULAR | Status: AC
  Filled 2017-05-14: qty 5

## 2017-05-14 SURGICAL SUPPLY — 30 items
APL SKNCLS STERI-STRIP NONHPOA (GAUZE/BANDAGES/DRESSINGS)
BAG DRAIN URO-CYSTO SKYTR STRL (DRAIN) ×3 IMPLANT
BAG DRN UROCATH (DRAIN) ×1
BASKET STONE 1.7 NGAGE (UROLOGICAL SUPPLIES) IMPLANT
BASKET ZERO TIP NITINOL 2.4FR (BASKET) ×3 IMPLANT
BENZOIN TINCTURE PRP APPL 2/3 (GAUZE/BANDAGES/DRESSINGS) IMPLANT
BSKT STON RTRVL ZERO TP 2.4FR (BASKET) ×1
CATH URET 5FR 28IN OPEN ENDED (CATHETERS) IMPLANT
CLOSURE WOUND 1/2 X4 (GAUZE/BANDAGES/DRESSINGS)
CLOTH BEACON ORANGE TIMEOUT ST (SAFETY) ×3 IMPLANT
FIBER LASER FLEXIVA 365 (UROLOGICAL SUPPLIES) IMPLANT
FIBER LASER TRAC TIP (UROLOGICAL SUPPLIES) ×2 IMPLANT
GLOVE BIO SURGEON STRL SZ7.5 (GLOVE) ×3 IMPLANT
GOWN STRL REUS W/TWL XL LVL3 (GOWN DISPOSABLE) ×3 IMPLANT
GUIDEWIRE STR DUAL SENSOR (WIRE) IMPLANT
GUIDEWIRE ZIPWRE .038 STRAIGHT (WIRE) ×3 IMPLANT
INFUSOR MANOMETER BAG 3000ML (MISCELLANEOUS) ×3 IMPLANT
IV NS 1000ML (IV SOLUTION)
IV NS 1000ML BAXH (IV SOLUTION) IMPLANT
IV NS IRRIG 3000ML ARTHROMATIC (IV SOLUTION) ×3 IMPLANT
KIT TURNOVER CYSTO (KITS) ×3 IMPLANT
MANIFOLD NEPTUNE II (INSTRUMENTS) ×3 IMPLANT
NS IRRIG 500ML POUR BTL (IV SOLUTION) ×6 IMPLANT
PACK CYSTO (CUSTOM PROCEDURE TRAY) ×3 IMPLANT
STENT URET 6FRX24 CONTOUR (STENTS) ×2 IMPLANT
STRIP CLOSURE SKIN 1/2X4 (GAUZE/BANDAGES/DRESSINGS) IMPLANT
SYRINGE 10CC LL (SYRINGE) ×3 IMPLANT
TUBE CONNECTING 12'X1/4 (SUCTIONS)
TUBE CONNECTING 12X1/4 (SUCTIONS) IMPLANT
TUBING UROLOGY SET (TUBING) ×2 IMPLANT

## 2017-05-14 NOTE — H&P (Signed)
Urology Preoperative H&P   Chief Complaint: Left flank pain  History of Present Illness: [name] [name] is a 27 y.o. 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  She underwent JJ stent placement on 05/03/17 2/2 to renal colic. She is here today for definitive stone treatment.  She denies interval fever/chills or nausea/vomiting.  She does report intermittent flank pain.    Past Medical History:  Diagnosis Date  . Chronic pain syndrome followed by dr Murray Hodgkins   neck , upper back  . Complication of anesthesia    larynaspasm after extubation following Cholecystectomy  . DDD (degenerative disc disease)   . Depression   . Fibromyalgia   . GAD (generalized anxiety disorder)   . GERD (gastroesophageal reflux disease)   . Hiatal hernia   . History of cervical dysplasia   . History of kidney stones   . History of panic attacks   . History of PID 2014  . Left ureteral stone   . Mild intermittent asthma    no inhaler  . Nephrolithiasis    small nonobstructive left renal stone per CT 05-03-2017  . Numbness and tingling in hands    occaionally due to back issues.  Marland Kitchen PONV (postoperative nausea and vomiting)    "a little bit of nausea"  . Popping of temporomandibular joint on opening of jaw   . Scoliosis   . Spina bifida occulta   . 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 CERVICAL CONIZATION W/BX N/A 06/01/2014   Procedure: LASER CONIZATION OF CERVIX;  Surgeon: Lazaro Arms, MD;  Location: [facility];  Service: Gynecology;  Laterality: N/A;  . CHOLECYSTECTOMY N/A 03/26/2013   Procedure: LAPAROSCOPIC CHOLECYSTECTOMY;  Surgeon: Dalia Heading, MD;  Location: [facility];  Service: General;  Laterality: N/A;  . CYSTOSCOPY W/ URETERAL STENT PLACEMENT  05/28/2011   Procedure: CYSTOSCOPY WITH RETROGRADE PYELOGRAM/URETERAL STENT PLACEMENT;  Surgeon: Ky Barban, MD;  Location: [facility];  Service: Urology;  Laterality: Right;  Cystoscopy  with Right Retrograde Pyelogram  . CYSTOSCOPY WITH STENT PLACEMENT Left 05/03/2017   Procedure: CYSTOSCOPY WITH LEFT URETERAL STENT PLACEMENT;  Surgeon: Rene Paci, MD;  Location: [facility];  Service: Urology;  Laterality: Left;  . CYSTOSCOPY WITH URETHRAL DILATATION  05/28/2011   Procedure: CYSTOSCOPY WITH URETHRAL DILATATION;  Surgeon: Ky Barban, MD;  Location: [facility];  Service: Urology;  Laterality: N/A;  . CYSTOSCOPY/RETROGRADE/URETEROSCOPY Right 12-08-2007    dr Jerre Simon   [facility]  . ESOPHAGOGASTRODUODENOSCOPY N/A 02/17/2013   Procedure: ESOPHAGOGASTRODUODENOSCOPY (EGD);  Surgeon: Corbin Ade, MD;  Location: [facility];  Service: Endoscopy;  Laterality: N/A;  11:30-moved to [address] to notify pt  . HOLMIUM LASER APPLICATION N/A 06/01/2014   Procedure: HOLMIUM LASER APPLICATION;  Surgeon: Lazaro Arms, MD;  Location: [facility];  Service: Gynecology;  Laterality: N/A;  . HYDROGEN BREATH TEST  08/06/2010   Procedure: HYDROGEN BREATH TEST;  Surgeon: Corbin Ade, MD;  Location: [facility];  Service: Gastroenterology;  Laterality: N/A;  . MULTIPLE EXTRACTIONS WITH ALVEOLOPLASTY Bilateral 08/04/2015   Procedure: MULTIPLE EXTRACTION;  Surgeon: Ocie Doyne, DDS;  Location: [facility];  Service: Oral Surgery;  Laterality: Bilateral;  . STONE EXTRACTION WITH BASKET  05/28/2011   Procedure: STONE EXTRACTION WITH BASKET;  Surgeon: Ky Barban, MD;  Location: [facility];  Service: Urology;  Laterality: Right;    Allergies:  Allergies  Allergen Reactions  .  Sulfamethoxazole Anaphylaxis  . Cefzil [Cefprozil] Nausea And Vomiting  . Penicillins Other (See Comments)    Stomach cramps- can tolerate cephalosporins  . Sulfa Antibiotics Other (See Comments)    Childhood Allergy    Family History  Problem Relation Age of Onset  . Multiple sclerosis Father   . Cervical cancer Mother   . Hypertension Mother   . Heart disease Unknown   . Lung disease Unknown   . Asthma Unknown   . Diabetes  Unknown   . Kidney disease Unknown   . Anesthesia problems Neg Hx   . Hypotension Neg Hx   . Malignant hyperthermia Neg Hx   . Pseudochol deficiency Neg Hx   . Colon cancer Neg Hx     Social History:  reports that she has been smoking cigarettes.  She has a 15.00 pack-year smoking history. She has never used smokeless tobacco. She reports that she does not drink alcohol or use drugs.  ROS: A complete review of systems was performed.  All systems are negative except for pertinent findings as noted.  Physical Exam:  Vital signs in last 24 hours: Temp:  [97.6 F (36.4 C)] 97.6 F (36.4 C) (05/01 0642) Pulse Rate:  [108] 108 (05/01 0642) Resp:  [18] 18 (05/01 0642) BP: (135)/(95) 135/95 (05/01 0642) SpO2:  [96 %] 96 % (05/01 0642) Weight:  [128.6 kg (283 lb 9.6 oz)] 128.6 kg (283 lb 9.6 oz) (05/01 8657) Constitutional:  Alert and oriented, No acute distress Cardiovascular: Regular rate and rhythm, No JVD Respiratory: Normal respiratory effort, Lungs clear bilaterally GI: Abdomen is soft, nontender, nondistended, no abdominal masses GU: No CVA tenderness Lymphatic: No lymphadenopathy Neurologic: Grossly intact, no focal deficits Psychiatric: Normal mood and affect  Laboratory Data:  No results for input(s): WBC, HGB, HCT, PLT in the last 72 hours.  No results for input(s): NA, K, CL, GLUCOSE, BUN, CALCIUM, CREATININE in the last 72 hours.  Invalid input(s): CO3   No results found for this or any previous visit (from the past 24 hour(s)). No results found for this or any previous visit (from the past 240 hour(s)).  Renal Function: No results for input(s): CREATININE in the last 168 hours. Estimated Creatinine Clearance: 93.8 mL/min (A) (by C-G formula based on SCr of 1.1 mg/dL (H)).  Radiologic Imaging: No results found.  I independently reviewed the above imaging studies.  Assessment and Plan [name] is a 27 y.o. female with an obstructing 12 mm left UPJ  stone  -The risks, benefits and alternatives of cystoscopy with left ureteroscopy, laser lithotripsy and left JJ stent placement/exchange was discussed with the patient.  She voices understanding and wishes to proceed.  Rhoderick Moody, MD 05/14/2017, 7:31 AM  [facility] Pager: [phone]

## 2017-05-14 NOTE — Transfer of Care (Signed)
Immediate Anesthesia Transfer of Care Note  Patient: [name] [name]  Procedure(s) Performed: Procedure(s) (LRB): CYSTOSCOPY/URETEROSCOPY/HOLMIUM LASER/STENT PLACEMENT (Left)  Patient Location: PACU  Anesthesia Type: General  Level of Consciousness: awake, sedated, patient cooperative and responds to stimulation  Airway & Oxygen Therapy: Patient Spontanous Breathing and Patient connected to face mask oxygen  Post-op Assessment: Report given to PACU RN, Post -op Vital signs reviewed and stable and Patient moving all extremities  Post vital signs: Reviewed and stable  Complications: No apparent anesthesia complications

## 2017-05-14 NOTE — Discharge Instructions (Signed)
Post Anesthesia Home Care Instructions  Activity: Get plenty of rest for the remainder of the day. A responsible individual must stay with you for 24 hours following the procedure.  For the next 24 hours, DO NOT: -Drive a car -Operate machinery -Drink alcoholic beverages -Take any medication unless instructed by your physician -Make any legal decisions or sign important papers.  Meals: Start with liquid foods such as gelatin or soup. Progress to regular foods as tolerated. Avoid greasy, spicy, heavy foods. If nausea and/or vomiting occur, drink only clear liquids until the nausea and/or vomiting subsides. Call your physician if vomiting continues.  Special Instructions/Symptoms: Your throat may feel dry or sore from the anesthesia or the breathing tube placed in your throat during surgery. If this causes discomfort, gargle with warm salt water. The discomfort should disappear within 24 hours.  If you had a scopolamine patch placed behind your ear for the management of post- operative nausea and/or vomiting:  1. The medication in the patch is effective for 72 hours, after which it should be removed.  Wrap patch in a tissue and discard in the trash. Wash hands thoroughly with soap and water. 2. You may remove the patch earlier than 72 hours if you experience unpleasant side effects which may include dry mouth, dizziness or visual disturbances. 3. Avoid touching the patch. Wash your hands with soap and water after contact with the patch.      [facility] [phone] Post Ureteroscopy With or Without Stent Instructions  Definitions:  Ureter: The duct that transports urine from the kidney to the bladder. Stent:   A plastic hollow tube that is placed into the ureter, from the kidney to the bladder to prevent the ureter from swelling shut.  GENERAL INSTRUCTIONS:  Despite the fact that no skin incisions were used, the area around the ureter and bladder is raw and  irritated. The stent is a foreign body which will further irritate the bladder wall. This irritation is manifested by increased frequency of urination, both day and night, and by an increase in the urge to urinate. In some, the urge to urinate is present almost always. Sometimes the urge is strong enough that you may not be able to stop yourself from urinating. The only real cure is to remove the stent and then give time for the bladder wall to heal which can't be done until the danger of the ureter swelling shut has passed, which varies.  You may see some blood in your urine while the stent is in place and a few days afterwards. Do not be alarmed, even if the urine was clear for a while. Get off your feet and drink lots of fluids until clearing occurs. If you start to pass clots or don't improve, call us.  DIET: You may return to your normal diet immediately. Because of the raw surface of your bladder, alcohol, spicy foods, acid type foods and drinks with caffeine may cause irritation or frequency and should be used in moderation. To keep your urine flowing freely and to avoid constipation, drink plenty of fluids during the day ( 8-10 glasses ). Tip: Avoid cranberry juice because it is very acidic.  ACTIVITY: Your physical activity doesn't need to be restricted. However, if you are very active, you may see some blood in your urine. We suggest that you reduce your activity under these circumstances until the bleeding has stopped.  BOWELS: It is important to keep your bowels regular during the postoperative period. Straining   with bowel movements can cause bleeding. A bowel movement every other day is reasonable. Use a mild laxative if needed, such as Milk of Magnesia 2-3 tablespoons, or 2 Dulcolax tablets. Call if you continue to have problems. If you have been taking narcotics for pain, before, during or after your surgery, you may be constipated. Take a laxative if necessary.   MEDICATION: You  should resume your pre-surgery medications unless told not to. In addition you will often be given an antibiotic to prevent infection. These should be taken as prescribed until the bottles are finished unless you are having an unusual reaction to one of the drugs.  PROBLEMS YOU SHOULD REPORT TO US: Fevers over 100.5 Fahrenheit. Heavy bleeding, or clots ( See above notes about blood in urine ). Inability to urinate. Drug reactions ( hives, rash, nausea, vomiting, diarrhea ). Severe burning or pain with urination that is not improving.  FOLLOW-UP: You will need a follow-up appointment to monitor your progress. Call for this appointment at the number listed above. Usually the first appointment will be about three to fourteen days after your surgery.      

## 2017-05-14 NOTE — Anesthesia Postprocedure Evaluation (Signed)
Anesthesia Post Note  Patient: [name] [name]  Procedure([name]) Performed: CYSTOSCOPY/URETEROSCOPY/HOLMIUM LASER/STENT PLACEMENT (Left )     Patient location during evaluation: PACU Anesthesia Type: General Level of consciousness: awake and alert Pain management: pain level controlled Vital Signs Assessment: post-procedure vital signs reviewed and stable Respiratory status: spontaneous breathing, nonlabored ventilation, respiratory function stable and patient connected to nasal cannula oxygen Cardiovascular status: blood pressure returned to baseline and stable Postop Assessment: no apparent nausea or vomiting Anesthetic complications: no    Last Vitals:  Vitals:   05/14/17 1000 05/14/17 1015  BP: 123/76 126/73  Pulse: 94 (!) 101  Resp: 16 15  Temp:    SpO2: 99% 92%    Last Pain:  Vitals:   05/14/17 1015  TempSrc:   PainSc: 4                  [name] [name]

## 2017-05-14 NOTE — Anesthesia Procedure Notes (Signed)
Procedure Name: LMA Insertion Date/Time: 05/14/2017 8:31 AM Performed by: Jessica Priest, CRNA Pre-anesthesia Checklist: Patient identified, Emergency Drugs available, Suction available and Patient being monitored Patient Re-evaluated:Patient Re-evaluated prior to induction Oxygen Delivery Method: Circle system utilized Preoxygenation: Pre-oxygenation with 100% oxygen Induction Type: IV induction Ventilation: Mask ventilation without difficulty LMA: LMA inserted LMA Size: 4.0 Number of attempts: 1 Airway Equipment and Method: Bite block Placement Confirmation: positive ETCO2 and breath sounds checked- equal and bilateral Tube secured with: Tape Dental Injury: Teeth and Oropharynx as per pre-operative assessment  Comments: Ramped with grey wedge , pillows and blankets

## 2017-05-14 NOTE — Anesthesia Preprocedure Evaluation (Signed)
Anesthesia Evaluation  Patient identified by MRN, date of birth, ID band Patient awake    Reviewed: Allergy & Precautions, NPO status , Patient's Chart, lab work & pertinent test results  Airway Mallampati: II  TM Distance: >3 FB Neck ROM: Full    Dental no notable dental hx.    Pulmonary neg pulmonary ROS, Current Smoker,    Pulmonary exam normal breath sounds clear to auscultation       Cardiovascular negative cardio ROS Normal cardiovascular exam Rhythm:Regular Rate:Normal     Neuro/Psych negative neurological ROS  negative psych ROS   GI/Hepatic Neg liver ROS, GERD  Medicated,  Endo/Other  Morbid obesity  Renal/GU negative Renal ROS  negative genitourinary   Musculoskeletal negative musculoskeletal ROS (+)   Abdominal   Peds negative pediatric ROS (+)  Hematology negative hematology ROS (+)   Anesthesia Other Findings   Reproductive/Obstetrics negative OB ROS                             Anesthesia Physical Anesthesia Plan  ASA: III  Anesthesia Plan: General   Post-op Pain Management:    Induction: Intravenous  PONV Risk Score and Plan: 2 and Ondansetron, Dexamethasone and Treatment may vary due to age or medical condition  Airway Management Planned: LMA and Oral ETT  Additional Equipment:   Intra-op Plan:   Post-operative Plan: Extubation in OR  Informed Consent: I have reviewed the patients History and Physical, chart, labs and discussed the procedure including the risks, benefits and alternatives for the proposed anesthesia with the patient or authorized representative who has indicated his/her understanding and acceptance.   Dental advisory given  Plan Discussed with: CRNA and Surgeon  Anesthesia Plan Comments:         Anesthesia Quick Evaluation

## 2017-05-14 NOTE — Op Note (Signed)
Operative Note  Preoperative diagnosis:  1.  12 mm left UPJ stone  Postoperative diagnosis: 1.  Same  Procedure(s): 1.  Cystoscopy 2.  Left JJ stent exchange 3.  Left ureteroscopy 4.  Left holmium laser lithotripsy  Surgeon: Rhoderick Moody, MD  Assistants: None  Anesthesia: General LMA  Complications: None  EBL: Less than 5 mL  Specimens: 1.  Previously placed left JJ stent was removed intact, inspected and discarded  Drains/Catheters: 1.  Left 6 French by 24 cm JJ stent with tether  Intraoperative findings:   1. 12 mm left UPJ stone  Indication:  [name] [name] is a 27 y.o. 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  She underwent cystoscopy with left JJ stent placement on 05/03/2017.  She is here today for definitive stone treatment.  She has been consented for the above procedures, voices understanding and wishes to proceed.  Description of procedure:  After informed consent was obtained, the patient was brought to the operating room and general LMA anesthesia was administered. The patient was then placed in the dorsolithotomy position and prepped and draped in usual sterile fashion. A timeout was performed. A 23 French rigid cystoscope was then inserted into the urethral meatus and advanced into the bladder under direct vision. A complete bladder survey revealed no intravesical pathology.  Her previously placed stent was grasped at its distal curl and retracted to the urethral meatus.  A Glidewire was then advanced through the lumen of the stent and up to the left renal pelvis, confirming placement via fluoroscopy.  The previously placed stent was then removed over the wire intact, inspected and discarded.  A flexible ureteroscope was then advanced over the wire and into the left ureter.  Her stone quickly migrated into the left renal pelvis.  A 200 m holmium laser was then used to fracture the stone into numerous smaller fragments.  The flexible ureteroscope was then  removed, leaving the wire in place.  A 6 French by 24 cm JJ stent was then placed over the wire and into position within the left collecting system, confirming placement via fluoroscopy.  The tether of the stent was left intact and tucked into the vaginal vault.  The patient's bladder was drained.  She tolerated the procedure well and was transferred to the postanesthesia in stable condition.  Plan: The patient has been instructed to remove her left JJ stent at 6 AM on 05/16/2017.  She will follow-up in 6 weeks for left renal ultrasound

## 2017-05-15 ENCOUNTER — Encounter (HOSPITAL_BASED_OUTPATIENT_CLINIC_OR_DEPARTMENT_OTHER): Payer: Self-pay | Admitting: Urology

## 2017-11-13 IMAGING — DX DG TIBIA/FIBULA 2V*L*
2 series · 2 of 2 positions shown · non-contrast
Comparison: None.

CLINICAL DATA: Lower leg pain after fall at work today. Midshaft
lateral and anterior pain.

EXAM:
LEFT TIBIA AND FIBULA - 2 VIEW

[tibia ap]
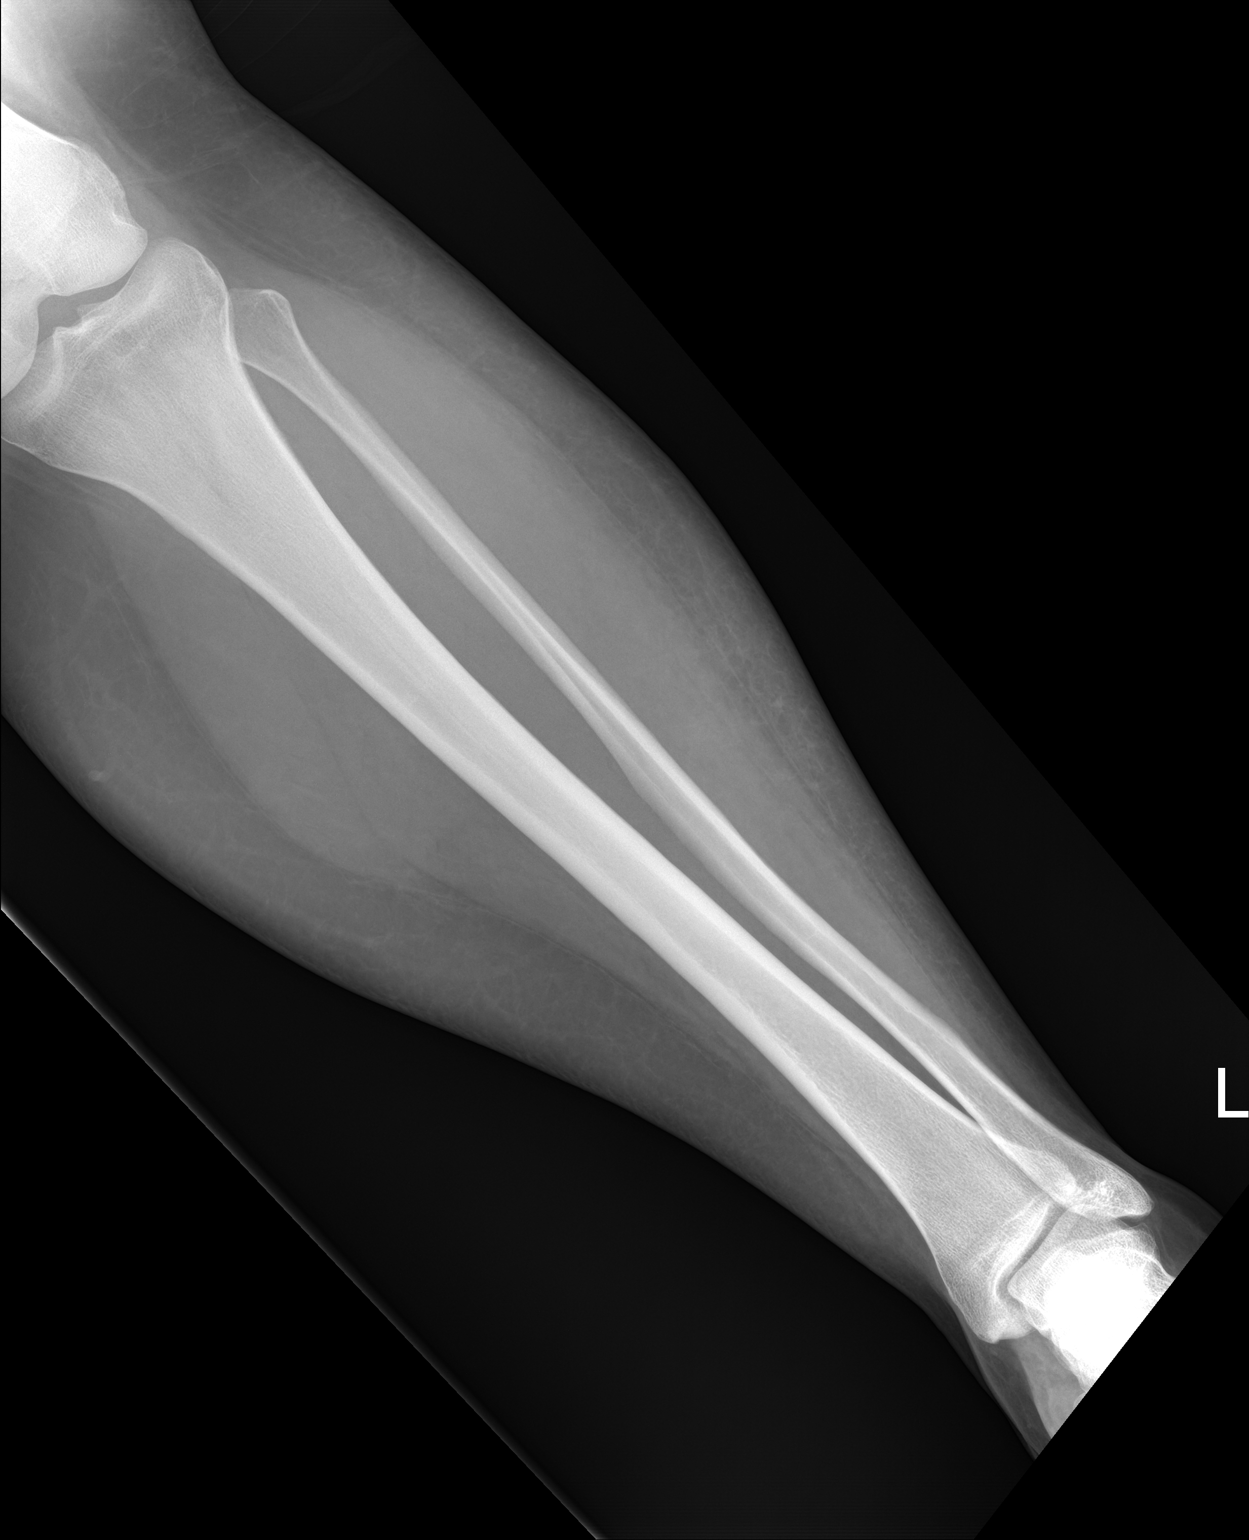

[tibia lat]
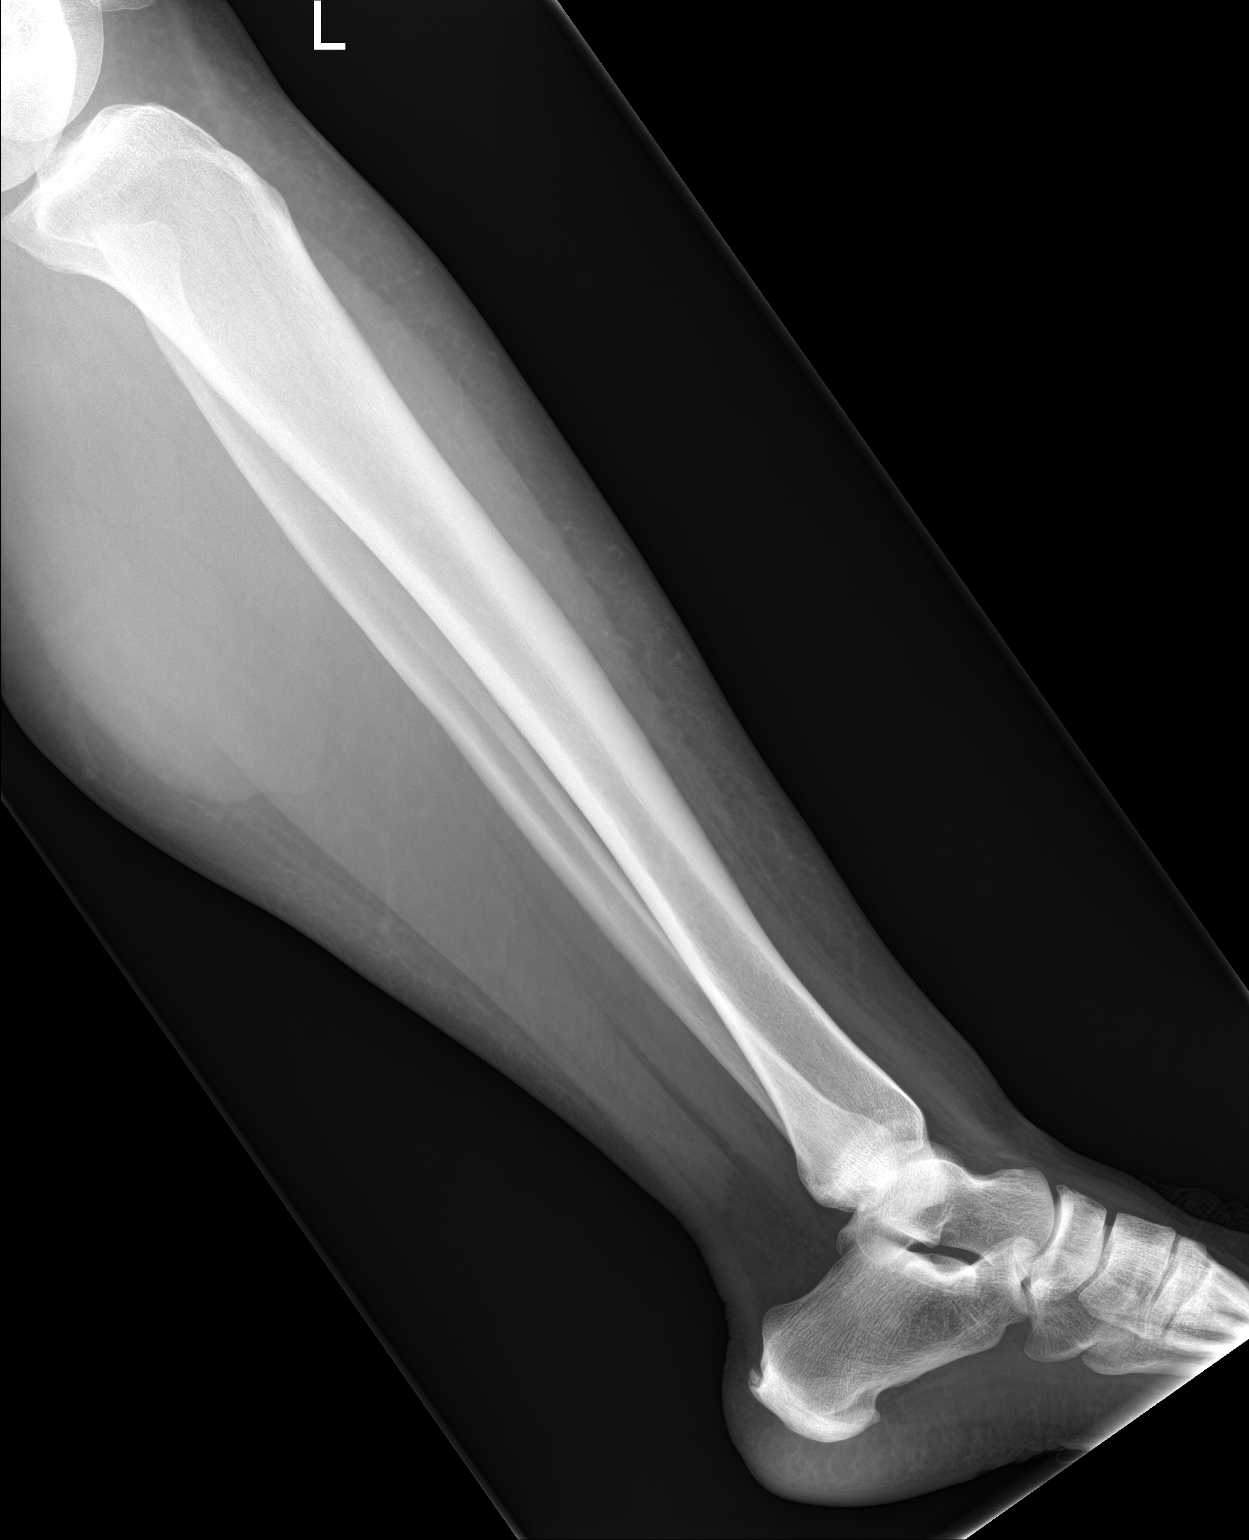

[2 of 2 positions shown; findings below may reference images not displayed]

FINDINGS: There is no evidence of fracture or other focal bone lesions. Soft
tissues are unremarkable.
IMPRESSION: Negative.

## 2017-12-03 ENCOUNTER — Emergency Department (HOSPITAL_COMMUNITY)
Admission: EM | Admit: 2017-12-03 | Discharge: 2017-12-03 | Disposition: A | Discharge status: Home / Self Care | Payer: Self-pay | Attending: Emergency Medicine | Admitting: Emergency Medicine

## 2017-12-03 ENCOUNTER — Encounter (HOSPITAL_COMMUNITY): Payer: Self-pay

## 2017-12-03 ENCOUNTER — Other Ambulatory Visit: Payer: Self-pay

## 2017-12-03 DIAGNOSIS — F17210 Nicotine dependence, cigarettes, uncomplicated: Secondary | ICD-10-CM | POA: Insufficient documentation

## 2017-12-03 DIAGNOSIS — R112 Nausea with vomiting, unspecified: Secondary | ICD-10-CM | POA: Insufficient documentation

## 2017-12-03 DIAGNOSIS — J4520 Mild intermittent asthma, uncomplicated: Secondary | ICD-10-CM | POA: Insufficient documentation

## 2017-12-03 DIAGNOSIS — R197 Diarrhea, unspecified: Secondary | ICD-10-CM | POA: Insufficient documentation

## 2017-12-03 LAB — COMPREHENSIVE METABOLIC PANEL
ALK PHOS: 89 U/L (ref 38–126)
ALT: 30 U/L (ref 0–44)
ANION GAP: 10 (ref 5–15)
AST: 33 U/L (ref 15–41)
Albumin: 4.2 g/dL (ref 3.5–5.0)
BILIRUBIN TOTAL: 0.6 mg/dL (ref 0.3–1.2)
BUN: 11 mg/dL (ref 6–20)
CALCIUM: 8.8 mg/dL — AB (ref 8.9–10.3)
CO2: 23 mmol/L (ref 22–32)
Chloride: 102 mmol/L (ref 98–111)
Creatinine, Ser: 1.06 mg/dL — ABNORMAL HIGH (ref 0.44–1.00)
GFR calc Af Amer: >60 mL/min (ref >60)
GFR calc non Af Amer: >60 mL/min (ref >60)
GLUCOSE: 127 mg/dL — AB (ref 70–99)
Potassium: 3.4 mmol/L — ABNORMAL LOW (ref 3.5–5.1)
Sodium: 135 mmol/L (ref 135–145)
TOTAL PROTEIN: 8.1 g/dL (ref 6.5–8.1)

## 2017-12-03 LAB — URINALYSIS, ROUTINE W REFLEX MICROSCOPIC
Bilirubin Urine: NEGATIVE
GLUCOSE, UA: NEGATIVE mg/dL
Hgb urine dipstick: NEGATIVE
Ketones, ur: 5 mg/dL — AB
Nitrite: NEGATIVE
PH: 5 (ref 5.0–8.0)
Protein, ur: 30 mg/dL — AB
SPECIFIC GRAVITY, URINE: 1.033 — AB (ref 1.005–1.030)

## 2017-12-03 LAB — CBC
HCT: 47.7 % — ABNORMAL HIGH (ref 36.0–46.0)
Hemoglobin: 14.8 g/dL (ref 12.0–15.0)
MCH: 24.8 pg — AB (ref 26.0–34.0)
MCHC: 31 g/dL (ref 30.0–36.0)
MCV: 79.9 fL — ABNORMAL LOW (ref 80.0–100.0)
PLATELETS: 269 10*3/uL (ref 150–400)
RBC: 5.97 MIL/uL — AB (ref 3.87–5.11)
RDW: 15.1 % (ref 11.5–15.5)
WBC: 5.9 10*3/uL (ref 4.0–10.5)
nRBC: 0 % (ref 0.0–0.2)

## 2017-12-03 LAB — LIPASE, BLOOD: Lipase: 25 U/L (ref 11–51)

## 2017-12-03 LAB — I-STAT BETA HCG BLOOD, ED (MC, WL, AP ONLY): I-stat hCG, quantitative: <5 m[IU]/mL (ref <5)

## 2017-12-03 LAB — ETHANOL

## 2017-12-03 MED ORDER — PROMETHAZINE HCL 25 MG/ML IJ SOLN
25.0000 mg | Freq: Once | INTRAMUSCULAR | Status: AC
  Administered 2017-12-03: 25 mg via INTRAVENOUS

## 2017-12-03 MED ORDER — PROMETHAZINE HCL 25 MG/ML IJ SOLN
INTRAMUSCULAR | Status: AC
  Filled 2017-12-03: qty 1

## 2017-12-03 MED ORDER — SODIUM CHLORIDE 0.9 % IV BOLUS
1000.0000 mL | Freq: Once | INTRAVENOUS | Status: AC
Start: 2017-12-03 — End: 2017-12-03
  Administered 2017-12-03: 1000 mL via INTRAVENOUS

## 2017-12-03 MED ORDER — ONDANSETRON 4 MG PO TBDP: 4.0000 mg | ORAL_TABLET | Freq: Three times a day (TID) | ORAL | 0 refills | Status: DC | PRN

## 2017-12-03 MED ORDER — POTASSIUM CHLORIDE IN NACL 20-0.9 MEQ/L-% IV SOLN
Freq: Once | INTRAVENOUS | Status: AC
  Administered 2017-12-03: 17:00:00 via INTRAVENOUS
  Filled 2017-12-03: qty 1000

## 2017-12-03 MED ORDER — ONDANSETRON HCL 4 MG/2ML IJ SOLN
4.0000 mg | Freq: Once | INTRAMUSCULAR | Status: AC
  Administered 2017-12-03: 4 mg via INTRAVENOUS
  Filled 2017-12-03: qty 2

## 2017-12-03 NOTE — Discharge Instructions (Signed)
As discussed, your evaluation today has been largely reassuring.  But, it is important that you monitor your condition carefully, and do not hesitate to return to the ED if you develop new, or concerning changes in your condition. ? ?Otherwise, please follow-up with your physician for appropriate ongoing care. ? ?

## 2017-12-03 NOTE — ED Triage Notes (Signed)
Pt has been having a headache since Monday, throwing up, having diarrhea, and coughing. Has vomited twice today and is now lightheaded. NAD.

## 2017-12-03 NOTE — ED Provider Notes (Signed)
[facility] EMERGENCY DEPARTMENT Provider Note   CSN: [id number] Arrival date & time: 12/03/17  1329     History   Chief Complaint Chief Complaint  Patient presents with  . Emesis  . Diarrhea    HPI [name] [name] is a 27 y.o. female.  HPI With multiple medical issues including fibromyalgia presents with 3 days of nausea, vomiting, diarrhea, generalized discomfort without focal pain. In addition to fibromyalgia she has a history of prior cholecystectomy. She states that she was well prior to 3 days ago, now since onset she has been persistently uncomfortable, without objective fever. No relief with anything and she has been incapable of tolerating oral medication. She notes that she has had loose stool, and innumerable episodes of emesis multiple times over the past 3 days.  Past Medical History:  Diagnosis Date  . Chronic pain syndrome followed by dr Murray Hodgkins   neck , upper back  . Complication of anesthesia    larynaspasm after extubation following Cholecystectomy  . DDD (degenerative disc disease)   . Depression   . Fibromyalgia   . GAD (generalized anxiety disorder)   . GERD (gastroesophageal reflux disease)   . Hiatal hernia   . History of cervical dysplasia   . History of kidney stones   . History of panic attacks   . History of PID 2014  . Left ureteral stone   . Mild intermittent asthma    no inhaler  . Nephrolithiasis    small nonobstructive left renal stone per CT 05-03-2017  . Numbness and tingling in hands    occaionally due to back issues.  Marland Kitchen PONV (postoperative nausea and vomiting)    "a little bit of nausea"  . Popping of temporomandibular joint on opening of jaw   . Scoliosis   . Spina bifida occulta   . 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 Urinary tract obstruction by kidney stone 05/03/2017  . Vitamin D deficiency 10/06/2015  . Depression with anxiety 10/06/2014  .  Encounter for long-term opiate analgesic use 07/28/2014  . Acanthosis nigricans 04/29/2014  . Morbid obesity (HCC) 02/13/2014  . Fibromyalgia 04/03/2012  . Asthma 04/03/2012  . Kidney stones 04/03/2012  . Right anterior knee pain 08/29/2011  . Periumbilical pain 05/29/2010  . NAUSEA AND VOMITING 01/23/2010  . DIARRHEA 01/23/2010  . RUQ PAIN 01/23/2010  . 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 Chronic back pain 03/17/2008    Past Surgical History:  Procedure Laterality Date  . CERVICAL CONIZATION W/BX N/A 06/01/2014   Procedure: LASER CONIZATION OF CERVIX;  Surgeon: Lazaro Arms, MD;  Location: [facility];  Service: Gynecology;  Laterality: N/A;  . CHOLECYSTECTOMY N/A 03/26/2013   Procedure: LAPAROSCOPIC CHOLECYSTECTOMY;  Surgeon: Dalia Heading, MD;  Location: [facility];  Service: General;  Laterality: N/A;  . CYSTOSCOPY W/ URETERAL STENT PLACEMENT  05/28/2011   Procedure: CYSTOSCOPY WITH RETROGRADE PYELOGRAM/URETERAL STENT PLACEMENT;  Surgeon: Ky Barban, MD;  Location: [facility];  Service: Urology;  Laterality: Right;  Cystoscopy with Right Retrograde Pyelogram  . CYSTOSCOPY WITH STENT PLACEMENT Left 05/03/2017   Procedure: CYSTOSCOPY WITH LEFT URETERAL STENT PLACEMENT;  Surgeon: Rene Paci, MD;  Location: [facility];  Service: Urology;  Laterality: Left;  . CYSTOSCOPY WITH URETHRAL DILATATION  05/28/2011   Procedure: CYSTOSCOPY WITH URETHRAL DILATATION;  Surgeon: Ky Barban, MD;  Location: [facility];  Service: Urology;  Laterality: N/A;  .  CYSTOSCOPY/RETROGRADE/URETEROSCOPY Right 12-08-2007    dr Jerre Simon   [facility]  . CYSTOSCOPY/URETEROSCOPY/HOLMIUM LASER/STENT PLACEMENT Left 05/14/2017   Procedure: CYSTOSCOPY/URETEROSCOPY/HOLMIUM LASER/STENT PLACEMENT;  Surgeon: Rene Paci, MD;  Location: [facility];  Service: Urology;  Laterality: Left;  . ESOPHAGOGASTRODUODENOSCOPY N/A 02/17/2013   Procedure: ESOPHAGOGASTRODUODENOSCOPY (EGD);  Surgeon:  Corbin Ade, MD;  [facility];  Service: Endoscopy;  Laterality: N/A;  11:30-moved to [address] to notify pt  . HOLMIUM LASER APPLICATION N/A 06/01/2014   Procedure: HOLMIUM LASER APPLICATION;  Surgeon: Lazaro Arms, MD;  Location: [facility];  Service: Gynecology;  Laterality: N/A;  . HYDROGEN BREATH TEST  08/06/2010   Procedure: HYDROGEN BREATH TEST;  Surgeon: Corbin Ade, MD;  Location: [facility];  Service: Gastroenterology;  Laterality: N/A;  . MULTIPLE EXTRACTIONS WITH ALVEOLOPLASTY Bilateral 08/04/2015   Procedure: MULTIPLE EXTRACTION;  Surgeon: Ocie Doyne, DDS;  Location: [facility];  Service: Oral Surgery;  Laterality: Bilateral;  . STONE EXTRACTION WITH BASKET  05/28/2011   Procedure: STONE EXTRACTION WITH BASKET;  Surgeon: Ky Barban, MD;  Location: [facility];  Service: Urology;  Laterality: Right;     OB History    Gravida  1   Para  1   Term  1   Preterm      AB      Living  1     SAB      TAB      Ectopic      Multiple      Live Births               Home Medications    Prior to Admission medications   Medication Sig Start Date End Date Taking? Authorizing Provider  diphenhydrAMINE (BENADRYL) 25 MG tablet Take 25 mg by mouth every 6 (six) hours as needed.   Yes [provider]  ibuprofen (ADVIL,MOTRIN) 200 MG tablet Take 800 mg by mouth every 6 (six) hours as needed.   Yes [provider]  ondansetron (ZOFRAN) 4 MG tablet Take 1 tablet (4 mg total) by mouth daily as needed for nausea or vomiting. Patient not taking: Reported on 12/03/2017 05/04/17 05/04/18  Rene Paci, MD    Family History Family History  Problem Relation Age of Onset  . Multiple sclerosis Father   . Cervical cancer Mother   . Hypertension Mother   . Heart disease Unknown   . Lung disease Unknown   . Asthma Unknown   . Diabetes Unknown   . Kidney disease Unknown   . Anesthesia problems Neg Hx   . Hypotension Neg Hx   . Malignant  hyperthermia Neg Hx   . Pseudochol deficiency Neg Hx   . Colon cancer Neg Hx     Social History Social History   Tobacco Use  . Smoking status: Current Every Day Smoker    Packs/day: 1.00    Years: 15.00    Pack years: 15.00    Types: Cigarettes  . Smokeless tobacco: Never Used  Substance Use Topics  . Alcohol use: No  . Drug use: No     Allergies   Sulfamethoxazole; Cefzil [cefprozil]; Penicillins; and Sulfa antibiotics   Review of Systems Review of Systems  Constitutional:       Per HPI, otherwise negative  HENT:       Per HPI, otherwise negative  Respiratory:       Per HPI, otherwise negative  Cardiovascular:       Per  HPI, otherwise negative  Gastrointestinal: Positive for abdominal pain, diarrhea, nausea and vomiting.  Endocrine:       Negative aside from HPI  Genitourinary:       Neg aside from HPI   Musculoskeletal:       Per HPI, otherwise negative  Skin: Negative.   Neurological: Positive for weakness. Negative for syncope.     Physical Exam Updated Vital Signs BP 112/87   Pulse 93   Temp 97.8 F (36.6 C) (Oral)   Resp 16   Wt 128.6 kg   LMP 11/19/2017   SpO2 95%   BMI 55.37 kg/m   Physical Exam  Constitutional: She is oriented to person, place, and time. She appears well-developed and well-nourished. No distress.  Obese young female awake and alert  HENT:  Head: Normocephalic and atraumatic.  Eyes: Conjunctivae and EOM are normal.  Cardiovascular: Regular rhythm. Tachycardia present.  Pulmonary/Chest: Effort normal and breath sounds normal. No stridor. No respiratory distress.  Abdominal: She exhibits no distension.  Protuberant, but nontender abdomen  Musculoskeletal: She exhibits no edema.  Neurological: She is alert and oriented to person, place, and time. No cranial nerve deficit.  Skin: Skin is warm and dry.  Psychiatric: She has a normal mood and affect.  Nursing note and vitals reviewed.    ED Treatments / Results   Labs (all labs ordered are listed, but only abnormal results are displayed) Labs Reviewed  COMPREHENSIVE METABOLIC PANEL - Abnormal; Notable for the following components:      Result Value   Potassium 3.4 (*)    Glucose, Bld 127 (*)    Creatinine, Ser 1.06 (*)    Calcium 8.8 (*)    All other components within normal limits  CBC - Abnormal; Notable for the following components:   RBC 5.97 (*)    HCT 47.7 (*)    MCV 79.9 (*)    MCH 24.8 (*)    All other components within normal limits  URINALYSIS, ROUTINE W REFLEX MICROSCOPIC - Abnormal; Notable for the following components:   APPearance HAZY (*)    Specific Gravity, Urine 1.033 (*)    Ketones, ur 5 (*)    Protein, ur 30 (*)    Leukocytes, UA TRACE (*)    Bacteria, UA RARE (*)    All other components within normal limits  LIPASE, BLOOD  ETHANOL  I-STAT BETA HCG BLOOD, ED ([facility], [facility], [facility] ONLY)     Procedures Procedures (including critical care time)  Medications Ordered in ED Medications  0.9 % NaCl with KCl 20 mEq/ L  infusion ( Intravenous Rate/Dose Verify 12/03/17 1736)  sodium chloride 0.9 % bolus 1,000 mL (0 mLs Intravenous Stopped 12/03/17 1513)  ondansetron (ZOFRAN) injection 4 mg (4 mg Intravenous Given 12/03/17 1411)  promethazine (PHENERGAN) injection 25 mg (25 mg Intravenous Given 12/03/17 1712)     Initial Impression / Assessment and Plan / ED Course  I have reviewed the triage vital signs and the nursing notes.  Pertinent labs & imaging results that were available during my care of the patient were reviewed by me and considered in my medical decision making (see chart for details).   : Patient felt transiently better, but had a return of her nausea. Labs notable for hypokalemia, dehydration on vital signs. Second liter of fluids running.  6:20 PM Patient feeling substantially better. Vital signs are improved, no additional vomiting. She has received 2 L of fluid, with potassium repletion as well.  With  improvement,  and without focal abdominal pain, low suspicion for acute abdomen, no evidence for bacteremia sepsis. Patient discharged with work note, antiemetics. Final Clinical Impressions(s) / ED Diagnoses  Nausea, vomiting and diarrhea   Gerhard MunchLockwood, Kyomi Hector, MD 12/03/17 1821

## 2018-09-23 ENCOUNTER — Other Ambulatory Visit: Payer: Self-pay

## 2018-09-23 ENCOUNTER — Emergency Department (HOSPITAL_COMMUNITY)
Admission: EM | Admit: 2018-09-23 | Discharge: 2018-09-23 | Disposition: A | Discharge status: Home / Self Care | Payer: Medicaid Other | Attending: Emergency Medicine | Admitting: Emergency Medicine

## 2018-09-23 ENCOUNTER — Telehealth: Payer: Medicaid Other

## 2018-09-23 ENCOUNTER — Encounter (HOSPITAL_COMMUNITY): Payer: Self-pay

## 2018-09-23 DIAGNOSIS — F17210 Nicotine dependence, cigarettes, uncomplicated: Secondary | ICD-10-CM | POA: Insufficient documentation

## 2018-09-23 DIAGNOSIS — J45909 Unspecified asthma, uncomplicated: Secondary | ICD-10-CM | POA: Insufficient documentation

## 2018-09-23 DIAGNOSIS — R110 Nausea: Secondary | ICD-10-CM | POA: Insufficient documentation

## 2018-09-23 DIAGNOSIS — K0889 Other specified disorders of teeth and supporting structures: Secondary | ICD-10-CM

## 2018-09-23 DIAGNOSIS — K029 Dental caries, unspecified: Secondary | ICD-10-CM | POA: Insufficient documentation

## 2018-09-23 MED ORDER — NAPROXEN 500 MG PO TABS: 500.0000 mg | ORAL_TABLET | Freq: Two times a day (BID) | ORAL | 0 refills | Status: DC

## 2018-09-23 MED ORDER — CLINDAMYCIN HCL 150 MG PO CAPS: 300.0000 mg | ORAL_CAPSULE | Freq: Four times a day (QID) | ORAL | 0 refills | Status: DC

## 2018-09-23 MED ORDER — MAGIC MOUTHWASH W/LIDOCAINE: 5.0000 mL | Freq: Three times a day (TID) | ORAL | 0 refills | Status: DC | PRN

## 2018-09-23 NOTE — ED Triage Notes (Signed)
Pt has a dental abscess to the upper left back tooth. Abscess appeared yesterday, but has been having pain for the last 3 days. Has been using ibuprofen and Tylenol for the last 3 days with no relief. No fevers.

## 2018-09-23 NOTE — ED Notes (Signed)
Pt verbalized understanding.

## 2018-09-23 NOTE — ED Provider Notes (Signed)
[facility] EMERGENCY DEPARTMENT Provider Note   CSN: [id number] Arrival date & time: 09/23/18  0844     History   Chief Complaint Chief Complaint  Patient presents with  . Dental Pain    HPI [name] [name] is a 28 y.o. female.     HPI   [name] [name] is a 28 y.o. female who presents to the Emergency Department complaining of left upper dental pain for 3 days.  She reports having pain to her left upper tooth that radiates toward her cheek and ear.  Yesterday, she noticed a "blister" to her left upper gum and since then her pain has progressed.  She has been taking Tylenol and ibuprofen without relief.  She reports having some nausea without fever or chills.  She denies neck pain difficulty swallowing or breathing, and facial swelling.  She states that she has been unable to get an appointment with a dentist     Past Medical History:  Diagnosis Date  . Chronic pain syndrome followed by dr Murray Hodgkinsbartko   neck , upper back  . Complication of anesthesia    larynaspasm after extubation following Cholecystectomy  . DDD (degenerative disc disease)   . Depression   . Fibromyalgia   . GAD (generalized anxiety disorder)   . GERD (gastroesophageal reflux disease)   . Hiatal hernia   . History of cervical dysplasia   . History of kidney stones   . History of panic attacks   . History of PID 2014  . Left ureteral stone   . Mild intermittent asthma    no inhaler  . Nephrolithiasis    small nonobstructive left renal stone per CT 05-03-2017  . Numbness and tingling in hands    occaionally due to back issues.  Marland Kitchen. PONV (postoperative nausea and vomiting)    "a little bit of nausea"  . Popping of temporomandibular joint on opening of jaw   . Scoliosis   . Spina bifida occulta   . 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 Urinary tract obstruction by kidney stone 05/03/2017  . Vitamin D deficiency 10/06/2015  .  Depression with anxiety 10/06/2014  . Encounter for long-term opiate analgesic use 07/28/2014  . Acanthosis nigricans 04/29/2014  . Morbid obesity (HCC) 02/13/2014  . Fibromyalgia 04/03/2012  . Asthma 04/03/2012  . Kidney stones 04/03/2012  . Right anterior knee pain 08/29/2011  . Periumbilical pain 05/29/2010  . NAUSEA AND VOMITING 01/23/2010  . DIARRHEA 01/23/2010  . RUQ PAIN 01/23/2010  . 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 Chronic back pain 03/17/2008    Past Surgical History:  Procedure Laterality Date  . CERVICAL CONIZATION W/BX N/A 06/01/2014   Procedure: LASER CONIZATION OF CERVIX;  Surgeon: Lazaro ArmsLuther H Eure, MD;  Location: [facility];  Service: Gynecology;  Laterality: N/A;  . CHOLECYSTECTOMY N/A 03/26/2013   Procedure: LAPAROSCOPIC CHOLECYSTECTOMY;  Surgeon: Dalia HeadingMark A Jenkins, MD;  Location: [facility];  Service: General;  Laterality: N/A;  . CYSTOSCOPY W/ URETERAL STENT PLACEMENT  05/28/2011   Procedure: CYSTOSCOPY WITH RETROGRADE PYELOGRAM/URETERAL STENT PLACEMENT;  Surgeon: Ky BarbanMohammad I Javaid, MD;  Location: [facility];  Service: Urology;  Laterality: Right;  Cystoscopy with Right Retrograde Pyelogram  . CYSTOSCOPY WITH STENT PLACEMENT Left 05/03/2017   Procedure: CYSTOSCOPY WITH LEFT URETERAL STENT PLACEMENT;  Surgeon: Rene PaciWinter, Christopher Aaron, MD;  Location: [facility];  Service: Urology;  Laterality: Left;  . CYSTOSCOPY WITH URETHRAL DILATATION  05/28/2011   Procedure: CYSTOSCOPY WITH URETHRAL DILATATION;  Surgeon: Marissa Nestle, MD;  Location: [facility];  Service: Urology;  Laterality: N/A;  . CYSTOSCOPY/RETROGRADE/URETEROSCOPY Right 12-08-2007    dr Michela Pitcher   [facility]  . CYSTOSCOPY/URETEROSCOPY/HOLMIUM LASER/STENT PLACEMENT Left 05/14/2017   Procedure: CYSTOSCOPY/URETEROSCOPY/HOLMIUM LASER/STENT PLACEMENT;  Surgeon: Ceasar Mons, MD;  Location: [facility];  Service: Urology;  Laterality: Left;  . ESOPHAGOGASTRODUODENOSCOPY N/A 02/17/2013   Procedure:  ESOPHAGOGASTRODUODENOSCOPY (EGD);  Surgeon: Daneil Dolin, MD;  [facility];  Service: Endoscopy;  Laterality: N/A;  11:30-moved to [place] to notify pt  . HOLMIUM LASER APPLICATION N/A 2/95/2841   Procedure: HOLMIUM LASER APPLICATION;  Surgeon: Florian Buff, MD;  Location: [facility];  Service: Gynecology;  Laterality: N/A;  . HYDROGEN BREATH TEST  08/06/2010   Procedure: HYDROGEN BREATH TEST;  Surgeon: Daneil Dolin, MD;  Location: [facility];  Service: Gastroenterology;  Laterality: N/A;  . MULTIPLE EXTRACTIONS WITH ALVEOLOPLASTY Bilateral 08/04/2015   Procedure: MULTIPLE EXTRACTION;  Surgeon: Diona Browner, DDS;  Location: [place];  Service: Oral Surgery;  Laterality: Bilateral;  . STONE EXTRACTION WITH BASKET  05/28/2011   Procedure: STONE EXTRACTION WITH BASKET;  Surgeon: Marissa Nestle, MD;  Location: [facility];  Service: Urology;  Laterality: Right;     OB History    Gravida  1   Para  1   Term  1   Preterm      AB      Living  1     SAB      TAB      Ectopic      Multiple      Live Births               Home Medications    Prior to Admission medications   Medication Sig Start Date End Date Taking? Authorizing Provider  diphenhydrAMINE (BENADRYL) 25 MG tablet Take 25 mg by mouth every 6 (six) hours as needed.    [provider]  ibuprofen (ADVIL,MOTRIN) 200 MG tablet Take 800 mg by mouth every 6 (six) hours as needed.    [provider]  ondansetron (ZOFRAN ODT) 4 MG disintegrating tablet Take 1 tablet (4 mg total) by mouth every 8 (eight) hours as needed for nausea or vomiting. 12/03/17   Carmin Muskrat, MD    Family History Family History  Problem Relation Age of Onset  . Multiple sclerosis Father   . Cervical cancer Mother   . Hypertension Mother   . Heart disease Other   . Lung disease Other   . Asthma Other   . Diabetes Other   . Kidney disease Other   . Anesthesia problems Neg Hx   . Hypotension Neg Hx   .  Malignant hyperthermia Neg Hx   . Pseudochol deficiency Neg Hx   . Colon cancer Neg Hx     Social History Social History   Tobacco Use  . Smoking status: Current Every Day Smoker    Packs/day: 1.00    Years: 15.00    Pack years: 15.00    Types: Cigarettes  . Smokeless tobacco: Never Used  Substance Use Topics  . Alcohol use: No  . Drug use: No     Allergies   Sulfamethoxazole, Cefzil [cefprozil], Penicillins, and Sulfa antibiotics   Review of Systems Review of Systems  Constitutional: Negative for appetite change and fever.  HENT: Positive for dental problem. Negative for congestion, facial swelling, sore throat  and trouble swallowing.   Eyes: Negative for pain and visual disturbance.  Respiratory: Negative for cough and shortness of breath.   Cardiovascular: Negative for chest pain.  Gastrointestinal: Negative for abdominal pain, nausea and vomiting.  Musculoskeletal: Negative for neck pain and neck stiffness.  Skin: Negative for rash.  Neurological: Negative for dizziness, facial asymmetry and headaches.  Hematological: Negative for adenopathy.     Physical Exam Updated Vital Signs BP (!) 139/97 (BP Location: Right Arm)   Pulse 97   Temp 98 F (36.7 C)   Resp 12   Ht 5' (1.524 m)   Wt 104.3 kg   LMP 08/16/2018   SpO2 96%   BMI 44.92 kg/m   Physical Exam Vitals signs and nursing note reviewed.  Constitutional:      General: She is not in acute distress.    Appearance: Normal appearance. She is well-developed. She is not ill-appearing or toxic-appearing.  HENT:     Head: Normocephalic.     Jaw: No trismus.     Right Ear: Tympanic membrane and ear canal normal.     Left Ear: Tympanic membrane and ear canal normal.     Mouth/Throat:     Mouth: Mucous membranes are moist.     Dentition: Dental caries present. No dental abscesses.     Pharynx: Oropharynx is clear. Uvula midline. No pharyngeal swelling or uvula swelling.     Comments: Dental decay and  tenderness to palpation of the left first upper molar.  There is erythema and mild edema of the surrounding gingiva.  No definite abscess or fluctuance.  No trismus.  No facial edema noted. Neck:     Musculoskeletal: Normal range of motion and neck supple.  Cardiovascular:     Rate and Rhythm: Normal rate and regular rhythm.     Pulses: Normal pulses.  Pulmonary:     Effort: Pulmonary effort is normal.     Breath sounds: Normal breath sounds.  Musculoskeletal: Normal range of motion.  Lymphadenopathy:     Cervical: No cervical adenopathy.  Skin:    General: Skin is warm.     Findings: No erythema.  Neurological:     General: No focal deficit present.     Mental Status: She is alert.     Sensory: No sensory deficit.     Motor: No weakness or abnormal muscle tone.      ED Treatments / Results  Labs (all labs ordered are listed, but only abnormal results are displayed) Labs Reviewed - No data to display  EKG None  Radiology No results found.  Procedures Procedures (including critical care time)  Medications Ordered in ED Medications - No data to display   Initial Impression / Assessment and Plan / ED Course  I have reviewed the triage vital signs and the nursing notes.  Pertinent labs & imaging results that were available during my care of the patient were reviewed by me and considered in my medical decision making (see chart for details).        Patient well-appearing.  Nontoxic.  Airway is patent and without evidence of Ludewig's angina.  Dental decay and possible developing dental abscess.  Patient agrees to treatment plan with warm salt water rinses and antibiotics and close dental follow-up.  Return precautions discussed.  Final Clinical Impressions(s) / ED Diagnoses   Final diagnoses:  Pain, dental    ED Discharge Orders    None       Pauline Aus, PA-C 09/23/18 0935  Maia PlanLong, Joshua G, MD 09/23/18 [phone]

## 2018-09-23 NOTE — Discharge Instructions (Addendum)
Take the antibiotic as directed until its finished.  You may also use warm salt water rinses as needed.  Be sure to follow-up with a dentist soon.

## 2021-02-19 ENCOUNTER — Telehealth: Payer: PRIVATE HEALTH INSURANCE | Admitting: Physician Assistant

## 2021-02-19 DIAGNOSIS — U071 COVID-19: Secondary | ICD-10-CM

## 2021-02-19 MED ORDER — ONDANSETRON 4 MG PO TBDP: 4.0000 mg | ORAL_TABLET | Freq: Three times a day (TID) | ORAL | 0 refills | Status: AC | PRN

## 2021-02-19 MED ORDER — MOLNUPIRAVIR EUA 200MG CAPSULE: 4.0000 | ORAL_CAPSULE | Freq: Two times a day (BID) | ORAL | 0 refills | Status: AC

## 2021-02-19 MED ORDER — BENZONATATE 100 MG PO CAPS: 100.0000 mg | ORAL_CAPSULE | Freq: Three times a day (TID) | ORAL | 0 refills | Status: AC | PRN

## 2021-02-19 NOTE — Patient Instructions (Addendum)
[name] [name], thank you for joining Piedad Climes, PA-C for today's virtual visit.  While this provider is not your primary care provider (PCP), if your PCP is located in our provider database this encounter information will be shared with them immediately following your visit.  Consent: (Patient) [name] [name] provided verbal consent for this virtual visit at the beginning of the encounter.  Current Medications:  Current Outpatient Medications:    naproxen (NAPROSYN) 500 MG tablet, Take 1 tablet (500 mg total) by mouth 2 (two) times daily with a meal., Disp: 20 tablet, Rfl: 0   Medications ordered in this encounter:  No orders of the defined types were placed in this encounter.    If you need refills on other medications prior to your next appointment, please contact your pharmacy*  Follow-Up: Call back or seek an in-person evaluation if the symptoms worsen or if the condition fails to improve as anticipated.  Other Instructions Please keep well-hydrated and get plenty of rest. Start a saline nasal rinse to flush out your nasal passages. You can use plain Mucinex to help thin congestion. If you have a humidifier, running in the bedroom at night. I want you to start OTC vitamin D3 1000 units daily, vitamin C 1000 mg daily, and a zinc supplement. Please take prescribed medications as directed.  You have been enrolled in a MyChart symptom monitoring program. Please answer these questions daily so we can keep track of how you are doing.  You were to quarantine for 5 days from onset of your symptoms.  After day 5, if you have had no fever and you are feeling better, you can end quarantine but need to mask for an additional 5 days. After day 5 if you have a fever or are having significant symptoms, please quarantine for full 10 days.  If you note any worsening of symptoms, any significant shortness of breath or any chest pain, please seek ER evaluation ASAP.  Please do not delay  care!  COVID-19: What to Do if You Are Sick If you test positive and are an older adult or someone who is at high risk of getting very sick from COVID-19, treatment may be available. Contact a healthcare provider right away after a positive test to determine if you are eligible, even if your symptoms are mild right now. You can also visit a Test to Treat location and, if eligible, receive a prescription from a provider. Don't delay: Treatment must be started within the first few days to be effective. If you have a fever, cough, or other symptoms, you might have COVID-19. Most people have mild illness and are able to recover at home. If you are sick: Keep track of your symptoms. If you have an emergency warning sign (including trouble breathing), call 911. Steps to help prevent the spread of COVID-19 if you are sick If you are sick with COVID-19 or think you might have COVID-19, follow the steps below to care for yourself and to help protect other people in your home and community. Stay home except to get medical care Stay home. Most people with COVID-19 have mild illness and can recover at home without medical care. Do not leave your home, except to get medical care. Do not visit public areas and do not go to places where you are unable to wear a mask. Take care of yourself. Get rest and stay hydrated. Take over-the-counter medicines, such as acetaminophen, to help you feel better. Stay in touch with your  doctor. Call before you get medical care. Be sure to get care if you have trouble breathing, or have any other emergency warning signs, or if you think it is an emergency. Avoid public transportation, ride-sharing, or taxis if possible. Get tested If you have symptoms of COVID-19, get tested. While waiting for test results, stay away from others, including staying apart from those living in your household. Get tested as soon as possible after your symptoms start. Treatments may be available for  people with COVID-19 who are at risk for becoming very sick. Don't delay: Treatment must be started early to be effective--some treatments must begin within 5 days of your first symptoms. Contact your healthcare provider right away if your test result is positive to determine if you are eligible. Self-tests are one of several options for testing for the virus that causes COVID-19 and may be more convenient than laboratory-based tests and point-of-care tests. Ask your healthcare provider or your local health department if you need help interpreting your test results. You can visit your state, tribal, local, and territorial health department's website to look for the latest local information on testing sites. Separate yourself from other people As much as possible, stay in a specific room and away from other people and pets in your home. If possible, you should use a separate bathroom. If you need to be around other people or animals in or outside of the home, wear a well-fitting mask. Tell your close contacts that they may have been exposed to COVID-19. An infected person can spread COVID-19 starting 48 hours (or 2 days) before the person has any symptoms or tests positive. By letting your close contacts know they may have been exposed to COVID-19, you are helping to protect everyone. See COVID-19 and Animals if you have questions about pets. If you are diagnosed with COVID-19, someone from the health department may call you. Answer the call to slow the spread. Monitor your symptoms Symptoms of COVID-19 include fever, cough, or other symptoms. Follow care instructions from your healthcare provider and local health department. Your local health authorities may give instructions on checking your symptoms and reporting information. When to seek emergency medical attention Look for emergency warning signs* for COVID-19. If someone is showing any of these signs, seek emergency medical care  immediately: Trouble breathing Persistent pain or pressure in the chest New confusion Inability to wake or stay awake Pale, gray, or blue-colored skin, lips, or nail beds, depending on skin tone *This list is not all possible symptoms. Please call your medical provider for any other symptoms that are severe or concerning to you. Call 911 or call ahead to your local emergency facility: Notify the operator that you are seeking care for someone who has or may have COVID-19. Call ahead before visiting your doctor Call ahead. Many medical visits for routine care are being postponed or done by phone or telemedicine. If you have a medical appointment that cannot be postponed, call your doctor's office, and tell them you have or may have COVID-19. This will help the office protect themselves and other patients. If you are sick, wear a well-fitting mask You should wear a mask if you must be around other people or animals, including pets (even at home). Wear a mask with the best fit, protection, and comfort for you. You don't need to wear the mask if you are alone. If you can't put on a mask (because of trouble breathing, for example), cover your coughs and sneezes in  some other way. Try to stay at least 6 feet away from other people. This will help protect the people around you. Masks should not be placed on young children under age 65 years, anyone who has trouble breathing, or anyone who is not able to remove the mask without help. Cover your coughs and sneezes Cover your mouth and nose with a tissue when you cough or sneeze. Throw away used tissues in a lined trash can. Immediately wash your hands with soap and water for at least 20 seconds. If soap and water are not available, clean your hands with an alcohol-based hand sanitizer that contains at least 60% alcohol. Clean your hands often Wash your hands often with soap and water for at least 20 seconds. This is especially important after blowing your  nose, coughing, or sneezing; going to the bathroom; and before eating or preparing food. Use hand sanitizer if soap and water are not available. Use an alcohol-based hand sanitizer with at least 60% alcohol, covering all surfaces of your hands and rubbing them together until they feel dry. Soap and water are the best option, especially if hands are visibly dirty. Avoid touching your eyes, nose, and mouth with unwashed hands. Handwashing Tips Avoid sharing personal household items Do not share dishes, drinking glasses, cups, eating utensils, towels, or bedding with other people in your home. Wash these items thoroughly after using them with soap and water or put in the dishwasher. Clean surfaces in your home regularly Clean and disinfect high-touch surfaces (for example, doorknobs, tables, handles, light switches, and countertops) in your "sick room" and bathroom. In shared spaces, you should clean and disinfect surfaces and items after each use by the person who is ill. If you are sick and cannot clean, a caregiver or other person should only clean and disinfect the area around you (such as your bedroom and bathroom) on an as needed basis. Your caregiver/other person should wait as long as possible (at least several hours) and wear a mask before entering, cleaning, and disinfecting shared spaces that you use. Clean and disinfect areas that may have blood, stool, or body fluids on them. Use household cleaners and disinfectants. Clean visible dirty surfaces with household cleaners containing soap or detergent. Then, use a household disinfectant. Use a product from [employer] N: Disinfectants for Coronavirus (COVID-19). Be sure to follow the instructions on the label to ensure safe and effective use of the product. Many products recommend keeping the surface wet with a disinfectant for a certain period of time (look at "contact time" on the product label). You may also need to wear personal protective  equipment, such as gloves, depending on the directions on the product label. Immediately after disinfecting, wash your hands with soap and water for 20 seconds. For completed guidance on cleaning and disinfecting your home, visit Complete Disinfection Guidance. Take steps to improve ventilation at home Improve ventilation (air flow) at home to help prevent from spreading COVID-19 to other people in your household. Clear out COVID-19 virus particles in the air by opening windows, using air filters, and turning on fans in your home. Use this interactive tool to learn how to improve air flow in your home. When you can be around others after being sick with COVID-19 Deciding when you can be around others is different for different situations. Find out when you can safely end home isolation. For any additional questions about your care, contact your healthcare provider or state or local health department. 04/04/2020 Content  source: [employer] for Immunization and Respiratory Diseases (NCIRD), Division of Viral Diseases This information is not intended to replace advice given to you by your health care provider. Make sure you discuss any questions you have with your health care provider. Document Revised: 05/18/2020 Document Reviewed: 05/18/2020 Elsevier Patient Education  2022 [employer].   If you have been instructed to have an in-person evaluation today at a local Urgent Care facility, please use the link below. It will take you to a list of all of our available [facility], including address, phone number and hours of operation. Please do not delay care.  [facility]  If you or a family member do not have a primary care provider, use the link below to schedule a visit and establish care. When you choose a [facility] primary care physician or advanced practice provider, you gain a long-term partner in health. Find a Primary Care Provider  Learn more about [facility]'s in-office and virtual care options:  - Get Care Now

## 2021-02-19 NOTE — Progress Notes (Signed)
Virtual Visit Consent   [name] [name], you are scheduled for a virtual visit with a [facility] provider today.     Just as with appointments in the office, your consent must be obtained to participate.  Your consent will be active for this visit and any virtual visit you may have with one of our providers in the next 365 days.     If you have a MyChart account, a copy of this consent can be sent to you electronically.  All virtual visits are billed to your insurance company just like a traditional visit in the office.    As this is a virtual visit, video technology does not allow for your provider to perform a traditional examination.  This may limit your provider's ability to fully assess your condition.  If your provider identifies any concerns that need to be evaluated in person or the need to arrange testing (such as labs, EKG, etc.), we will make arrangements to do so.     Although advances in technology are sophisticated, we cannot ensure that it will always work on either your end or our end.  If the connection with a video visit is poor, the visit may have to be switched to a telephone visit.  With either a video or telephone visit, we are not always able to ensure that we have a secure connection.     I need to obtain your verbal consent now.   Are you willing to proceed with your visit today?    [name] [name] has provided verbal consent on 02/19/2021 for a virtual visit (video or telephone).   Piedad Climes, [place]   Date: 02/19/2021 7:35 PM   Virtual Visit via Video Note   I, Piedad Climes, connected with  [name] [name]  ([id number], Dec 07, 1990) on 02/19/21 at  7:30 PM EST by a video-enabled telemedicine application and verified that I am speaking with the correct person using two identifiers.  Location: Patient: Virtual Visit Location Patient: Home Provider: Virtual Visit Location Provider: Home Office   I discussed the limitations of evaluation and management by  telemedicine and the availability of in person appointments. The patient expressed understanding and agreed to proceed.    History of Present Illness: [name] [name] is a 31 y.o. who identifies as a female who was assigned female at birth, and is being seen today for COVID-19. Patient endorses symptoms starting this morning with nausea, vomiting, fever 101, chills and hot flashes. Also with cough and chest congestion and head ache. Took a home COVID test this morning which was positive. Biggest symptom has been nausea and non-bloody emesis. Has hydrated but not eating anything.   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   Chronic back pain 03/17/2008    Allergies:  Allergies  Allergen Reactions   Sulfamethoxazole Anaphylaxis   Cefzil [Cefprozil] Nausea And Vomiting   Penicillins Other (See Comments)    Stomach cramps- can tolerate  cephalosporins   Sulfa Antibiotics Other (See Comments)    Childhood Allergy   Medications:  Current Outpatient Medications:    benzonatate (TESSALON) 100 MG capsule, Take 1 capsule (100 mg total) by mouth 3 (three) times daily as needed for cough., Disp: 30 capsule, Rfl: 0   molnupiravir EUA (LAGEVRIO) 200 mg CAPS capsule, Take 4 capsules (800 mg total) by mouth 2 (two) times daily for 5 days., Disp: 40 capsule, Rfl: 0   ondansetron (ZOFRAN-ODT) 4 MG disintegrating tablet, Take 1 tablet (4 mg total) by  mouth every 8 (eight) hours as needed for nausea or vomiting., Disp: 20 tablet, Rfl: 0  Observations/Objective: Patient is well-developed, well-nourished in no acute distress.  Resting comfortably at home.  Head is normocephalic, atraumatic.  No labored breathing. Speech is clear and coherent with logical content.  Patient is alert and oriented at baseline.   Assessment and Plan: 1. COVID-19 - ondansetron (ZOFRAN-ODT) 4 MG disintegrating tablet; Take 1 tablet (4 mg total) by mouth every 8 (eight) hours as needed for nausea or vomiting.  Dispense: 20 tablet; Refill: 0 - benzonatate (TESSALON) 100 MG capsule; Take 1 capsule (100 mg total) by mouth 3 (three) times daily as needed for cough.  Dispense: 30 capsule; Refill: 0 - molnupiravir EUA (LAGEVRIO) 200 mg CAPS capsule; Take 4 capsules (800 mg total) by mouth 2 (two) times daily for 5 days.  Dispense: 40 capsule; Refill: 0 - MyChart COVID-19 home monitoring program; Future  Patient with multiple risk factors for complicated course of illness. Discussed risks/benefits of antiviral medications including most common potential ADRs. Patient voiced understanding and would like to proceed with antiviral medication. They are candidate for molnupiravir. Rx sent to pharmacy. Supportive measures, OTC medications and vitamin regimen reviewed. Zofran and Tessalon per orders. Patient has been enrolled in a MyChart COVID symptom monitoring program. Anne Shutter reviewed in detail. Strict ER precautions discussed with patient.    Follow Up Instructions: I discussed the assessment and treatment plan with the patient. The patient was provided an opportunity to ask questions and all were answered. The patient agreed with the plan and demonstrated an understanding of the instructions.  A copy of instructions were sent to the patient via MyChart unless otherwise noted below.   The patient was advised to call back or seek an in-person evaluation if the symptoms  worsen or if the condition fails to improve as anticipated.  Time:  I spent 10 minutes with the patient via telehealth technology discussing the above problems/concerns.    Piedad Climes, PA-C

## 2023-10-03 ENCOUNTER — Telehealth: Payer: Self-pay | Admitting: Family Medicine

## 2023-10-03 DIAGNOSIS — J4521 Mild intermittent asthma with (acute) exacerbation: Secondary | ICD-10-CM

## 2023-10-03 DIAGNOSIS — B349 Viral infection, unspecified: Secondary | ICD-10-CM

## 2023-10-03 MED ORDER — PREDNISONE 20 MG PO TABS: 20.0000 mg | ORAL_TABLET | Freq: Two times a day (BID) | ORAL | 0 refills | Status: AC

## 2023-10-03 MED ORDER — PROMETHAZINE-DM 6.25-15 MG/5ML PO SYRP: 5.0000 mL | ORAL_SOLUTION | Freq: Four times a day (QID) | ORAL | 0 refills | Status: AC | PRN

## 2023-10-03 MED ORDER — ALBUTEROL SULFATE HFA 108 (90 BASE) MCG/ACT IN AERS: 2.0000 | INHALATION_SPRAY | Freq: Four times a day (QID) | RESPIRATORY_TRACT | 0 refills | Status: AC | PRN

## 2023-10-03 NOTE — Progress Notes (Signed)
 Virtual Visit Consent   [name] [name], you are scheduled for a virtual visit with a [facility] provider today. Just as with appointments in the office, your consent must be obtained to participate. Your consent will be active for this visit and any virtual visit you may have with one of our providers in the next 365 days. If you have a MyChart account, a copy of this consent can be sent to you electronically.  As this is a virtual visit, video technology does not allow for your provider to perform a traditional examination. This may limit your provider's ability to fully assess your condition. If your provider identifies any concerns that need to be evaluated in person or the need to arrange testing (such as labs, EKG, etc.), we will make arrangements to do so. Although advances in technology are sophisticated, we cannot ensure that it will always work on either your end or our end. If the connection with a video visit is poor, the visit may have to be switched to a telephone visit. With either a video or telephone visit, we are not always able to ensure that we have a secure connection.  By engaging in this virtual visit, you consent to the provision of healthcare and authorize for your insurance to be billed (if applicable) for the services provided during this visit. Depending on your insurance coverage, you may receive a charge related to this service.  I need to obtain your verbal consent now. Are you willing to proceed with your visit today? [name] [name] has provided verbal consent on 10/03/2023 for a virtual visit (video or telephone). [name] Lamp, FNP  Date: 10/03/2023 12:21 PM   Virtual Visit via Video Note   I, [name] [name], connected with  [name] [name]  ([id number], 11-10-1990) on 10/03/23 at 12:15 PM EDT by a video-enabled telemedicine application and verified that I am speaking with the correct person using two identifiers.  Location: Patient: Virtual Visit Location Patient:  Home Provider: Virtual Visit Location Provider: Home Office   I discussed the limitations of evaluation and management by telemedicine and the availability of in person appointments. The patient expressed understanding and agreed to proceed.    History of Present Illness: [name] [name] is a 33 y.o. who identifies as a female who was assigned female at birth, and is being seen today for vomiting,sweats, fever started yesterday, cough, wheezing and sob. Slight head congestion. Able to keep down fluids today. Has a history of asthma. In no distress.   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   Chronic back pain 03/17/2008    Allergies:  Allergies  Allergen Reactions   Sulfamethoxazole Anaphylaxis   Cefzil  [Cefprozil ] Nausea And Vomiting   Penicillins Other (See Comments)    Stomach cramps- can tolerate cephalosporins   Sulfa Antibiotics Other (See Comments)    Childhood Allergy   Medications:  Current Outpatient Medications:    albuterol  (VENTOLIN  HFA) 108 (90 Base) MCG/ACT  inhaler, Inhale 2 puffs into the lungs every 6 (six) hours as needed for wheezing or shortness of breath., Disp: 8 g, Rfl: 0   predniSONE  (DELTASONE ) 20 MG tablet, Take 1 tablet (20 mg total) by mouth 2 (two) times daily with a meal for 5 days., Disp: 10 tablet, Rfl: 0   promethazine -dextromethorphan (PROMETHAZINE -DM) 6.25-15 MG/5ML syrup, Take 5 mLs by mouth 4  (four) times daily as needed for up to 10 days for cough., Disp: 118 mL, Rfl: 0   benzonatate  (TESSALON ) 100 MG capsule, Take 1 capsule (100 mg total) by mouth 3 (three) times daily as needed for cough., Disp: 30 capsule, Rfl: 0   ondansetron  (ZOFRAN -ODT) 4 MG disintegrating tablet, Take 1 tablet (4 mg total) by mouth every 8 (eight) hours as needed for nausea or vomiting., Disp: 20 tablet, Rfl: 0  Observations/Objective: Patient is well-developed, well-nourished in no acute distress.  Resting comfortably  at home.  Head is normocephalic, atraumatic.  No labored breathing.  Speech is clear and coherent with logical content.  Patient is alert and oriented at baseline.    Assessment and Plan: 1. Mild intermittent asthma with acute exacerbation (Primary)  2. Viral infection  Increase fluids, humidifier at night, tylenol , UC if sx worsen or persist.   Follow Up Instructions: I discussed the assessment and treatment plan with the patient. The patient was provided an opportunity to ask questions and all were answered. The patient agreed with the plan and demonstrated an understanding of the instructions.  A copy of instructions were sent to the patient via MyChart unless otherwise noted below.     The patient was advised to call back or seek an in-person evaluation if the symptoms worsen or if the condition fails to improve as anticipated.    Loreta Blouch, FNP

## 2023-10-03 NOTE — Patient Instructions (Signed)
 Viral Respiratory Infection A respiratory infection is an illness that affects part of the respiratory system, such as the lungs, nose, or throat. A respiratory infection that is caused by a virus is called a viral respiratory infection. Common types of viral respiratory infections include: A cold. The flu (influenza). A respiratory syncytial virus (RSV) infection. What are the causes? This condition is caused by a virus. The virus may spread through contact with droplets or direct contact with infected people or their mucus or secretions. The virus may spread from person to person (is contagious). What are the signs or symptoms? Symptoms of this condition include: A stuffy or runny nose. A sore throat or cough. Shortness of breath or difficulty breathing. Yellow or green mucus (sputum). Other symptoms may include: A fever. Sweating or chills. Fatigue. Achy muscles. A headache. How is this diagnosed? This condition may be diagnosed based on: Your symptoms. A physical exam. Testing of secretions from the nose or throat. Chest X-ray. How is this treated? This condition may be treated with medicines, such as: Antiviral medicine. This may shorten the length of time a person has symptoms. Expectorants. These make it easier to cough up mucus. Decongestant nasal sprays. Acetaminophen or NSAIDs, such as ibuprofen, to relieve fever and pain. Antibiotic medicines are not prescribed for viral infections.This is because antibiotics are designed to kill bacteria. They do not kill viruses. Follow these instructions at home: Managing pain and congestion Take over-the-counter and prescription medicines only as told by your health care provider. If you have a sore throat, gargle with a mixture of salt and water 3-4 times a day or as needed. To make salt water, completely dissolve -1 tsp (3-6 g) of salt in 1 cup (237 mL) of warm water. Use nose drops made from salt water to ease congestion and  soften raw skin around your nose. Take 2 tsp (10 mL) of honey at bedtime to lessen coughing at night. Do not give honey to children who are younger than 1 year. Drink enough fluid to keep your urine pale yellow. This helps prevent dehydration and helps loosen up mucus. General instructions  Rest as much as possible. Do not drink alcohol. Do not use any products that contain nicotine or tobacco. These products include cigarettes, chewing tobacco, and vaping devices, such as e-cigarettes. If you need help quitting, ask your health care provider. Keep all follow-up visits. This is important. How is this prevented?     Get an annual flu shot. You may get the flu shot in late summer, fall, or winter. Ask your health care provider when you should get your flu shot. Avoid spreading your infection to other people. If you are sick: Wash your hands with soap and water often, especially after you cough or sneeze. Wash for at least 20 seconds. If soap and water are not available, use alcohol-based hand sanitizer. Cover your mouth when you cough. Cover your nose and mouth when you sneeze. Do not share cups or eating utensils. Clean commonly used objects often. Clean commonly touched surfaces. Stay home from work or school as told by your health care provider. Avoid contact with people who are sick during cold and flu season. This is generally fall and winter. Contact a health care provider if: Your symptoms last for 10 days or longer. Your symptoms get worse over time. You have severe sinus pain in your face or forehead. The glands in your jaw or neck become very swollen. You have shortness of breath. Get  help right away if you: Feel pain or pressure in your chest. Have trouble breathing. Faint or feel like you will faint. Have severe and persistent vomiting. Feel confused or disoriented. These symptoms may represent a serious problem that is an emergency. Do not wait to see if the symptoms will  go away. Get medical help right away. Call your local emergency services (911 in the [place] Do not drive yourself to the hospital. Summary A respiratory infection is an illness that affects part of the respiratory system, such as the lungs, nose, or throat. A respiratory infection that is caused by a virus is called a viral respiratory infection. Common types of viral respiratory infections include a cold, influenza, and respiratory syncytial virus (RSV) infection. Symptoms of this condition include a stuffy or runny nose, cough, fatigue, achy muscles, sore throat, and fevers or chills. Antibiotic medicines are not prescribed for viral infections. This is because antibiotics are designed to kill bacteria. They are not effective against viruses. This information is not intended to replace advice given to you by your health care provider. Make sure you discuss any questions you have with your health care provider. Document Revised: 04/06/2020 Document Reviewed: 04/06/2020 [employer] Patient Education  2024 [employer].
